# Patient Record
Sex: Female | Born: 1980
Health system: Southern US, Community
[De-identification: ages and names within clinical notes are randomized; demographics above are authoritative.]

## PROBLEM LIST (undated history)

## (undated) DIAGNOSIS — Z9981 Dependence on supplemental oxygen: Secondary | ICD-10-CM

## (undated) DIAGNOSIS — J449 Chronic obstructive pulmonary disease, unspecified: Secondary | ICD-10-CM

## (undated) DIAGNOSIS — E8881 Metabolic syndrome: Secondary | ICD-10-CM

## (undated) DIAGNOSIS — K259 Gastric ulcer, unspecified as acute or chronic, without hemorrhage or perforation: Secondary | ICD-10-CM

## (undated) DIAGNOSIS — M109 Gout, unspecified: Secondary | ICD-10-CM

## (undated) DIAGNOSIS — F329 Major depressive disorder, single episode, unspecified: Secondary | ICD-10-CM

## (undated) DIAGNOSIS — F3174 Bipolar disorder, in full remission, most recent episode manic: Secondary | ICD-10-CM

## (undated) DIAGNOSIS — I214 Non-ST elevation (NSTEMI) myocardial infarction: Secondary | ICD-10-CM

## (undated) DIAGNOSIS — F32A Depression, unspecified: Secondary | ICD-10-CM

## (undated) DIAGNOSIS — E7800 Pure hypercholesterolemia, unspecified: Secondary | ICD-10-CM

## (undated) DIAGNOSIS — E039 Hypothyroidism, unspecified: Secondary | ICD-10-CM

## (undated) DIAGNOSIS — I2510 Atherosclerotic heart disease of native coronary artery without angina pectoris: Secondary | ICD-10-CM

## (undated) DIAGNOSIS — I509 Heart failure, unspecified: Secondary | ICD-10-CM

## (undated) DIAGNOSIS — I10 Essential (primary) hypertension: Secondary | ICD-10-CM

## (undated) DIAGNOSIS — K219 Gastro-esophageal reflux disease without esophagitis: Secondary | ICD-10-CM

## (undated) DIAGNOSIS — E282 Polycystic ovarian syndrome: Secondary | ICD-10-CM

## (undated) DIAGNOSIS — E119 Type 2 diabetes mellitus without complications: Secondary | ICD-10-CM

## (undated) DIAGNOSIS — F419 Anxiety disorder, unspecified: Secondary | ICD-10-CM

## (undated) HISTORY — PX: CERVICAL BIOPSY  W/ LOOP ELECTRODE EXCISION: SUR135

## (undated) HISTORY — PX: CARPAL TUNNEL RELEASE: SHX101

## (undated) HISTORY — PX: TONSILLECTOMY: SUR1361

---

## 2000-11-18 ENCOUNTER — Encounter: Payer: Self-pay | Admitting: *Deleted

## 2000-11-18 ENCOUNTER — Emergency Department (HOSPITAL_COMMUNITY): Admission: EM | Admit: 2000-11-18 | Discharge: 2000-11-18 | Payer: Self-pay | Admitting: *Deleted

## 2001-02-16 ENCOUNTER — Emergency Department (HOSPITAL_COMMUNITY): Admission: EM | Admit: 2001-02-16 | Discharge: 2001-02-16 | Payer: Self-pay | Admitting: Emergency Medicine

## 2001-03-02 ENCOUNTER — Emergency Department (HOSPITAL_COMMUNITY): Admission: EM | Admit: 2001-03-02 | Discharge: 2001-03-02 | Payer: Self-pay | Admitting: Emergency Medicine

## 2001-12-24 ENCOUNTER — Emergency Department (HOSPITAL_COMMUNITY): Admission: EM | Admit: 2001-12-24 | Discharge: 2001-12-24 | Payer: Self-pay | Admitting: Emergency Medicine

## 2002-01-07 ENCOUNTER — Emergency Department (HOSPITAL_COMMUNITY): Admission: EM | Admit: 2002-01-07 | Discharge: 2002-01-07 | Payer: Self-pay | Admitting: *Deleted

## 2002-06-22 ENCOUNTER — Emergency Department (HOSPITAL_COMMUNITY): Admission: EM | Admit: 2002-06-22 | Discharge: 2002-06-22 | Payer: Self-pay | Admitting: Emergency Medicine

## 2002-07-28 ENCOUNTER — Emergency Department (HOSPITAL_COMMUNITY): Admission: EM | Admit: 2002-07-28 | Discharge: 2002-07-28 | Payer: Self-pay | Admitting: Emergency Medicine

## 2002-07-29 ENCOUNTER — Emergency Department (HOSPITAL_COMMUNITY): Admission: EM | Admit: 2002-07-29 | Discharge: 2002-07-29 | Payer: Self-pay

## 2002-09-22 ENCOUNTER — Emergency Department (HOSPITAL_COMMUNITY): Admission: EM | Admit: 2002-09-22 | Discharge: 2002-09-22 | Payer: Self-pay | Admitting: *Deleted

## 2002-09-22 ENCOUNTER — Encounter: Payer: Self-pay | Admitting: *Deleted

## 2003-01-04 ENCOUNTER — Emergency Department (HOSPITAL_COMMUNITY): Admission: EM | Admit: 2003-01-04 | Discharge: 2003-01-04 | Payer: Self-pay | Admitting: Emergency Medicine

## 2003-03-13 ENCOUNTER — Inpatient Hospital Stay (HOSPITAL_COMMUNITY): Admission: AD | Admit: 2003-03-13 | Discharge: 2003-03-13 | Payer: Self-pay | Admitting: Obstetrics & Gynecology

## 2003-03-25 ENCOUNTER — Encounter: Admission: RE | Admit: 2003-03-25 | Discharge: 2003-03-25 | Payer: Self-pay | Admitting: Obstetrics and Gynecology

## 2003-04-29 ENCOUNTER — Encounter: Admission: RE | Admit: 2003-04-29 | Discharge: 2003-04-29 | Payer: Self-pay | Admitting: Obstetrics and Gynecology

## 2003-06-10 ENCOUNTER — Emergency Department (HOSPITAL_COMMUNITY): Admission: EM | Admit: 2003-06-10 | Discharge: 2003-06-10 | Payer: Self-pay | Admitting: Emergency Medicine

## 2003-08-15 ENCOUNTER — Emergency Department (HOSPITAL_COMMUNITY): Admission: EM | Admit: 2003-08-15 | Discharge: 2003-08-15 | Payer: Self-pay | Admitting: Family Medicine

## 2003-09-04 ENCOUNTER — Emergency Department (HOSPITAL_COMMUNITY): Admission: AD | Admit: 2003-09-04 | Discharge: 2003-09-04 | Payer: Self-pay | Admitting: Family Medicine

## 2003-11-13 ENCOUNTER — Emergency Department (HOSPITAL_COMMUNITY): Admission: EM | Admit: 2003-11-13 | Discharge: 2003-11-13 | Payer: Self-pay | Admitting: Family Medicine

## 2003-12-23 ENCOUNTER — Inpatient Hospital Stay (HOSPITAL_COMMUNITY): Admission: AD | Admit: 2003-12-23 | Discharge: 2003-12-23 | Payer: Self-pay | Admitting: Family Medicine

## 2004-01-04 ENCOUNTER — Emergency Department (HOSPITAL_COMMUNITY): Admission: EM | Admit: 2004-01-04 | Discharge: 2004-01-05 | Payer: Self-pay | Admitting: Emergency Medicine

## 2004-03-26 ENCOUNTER — Emergency Department (HOSPITAL_COMMUNITY): Admission: EM | Admit: 2004-03-26 | Discharge: 2004-03-26 | Payer: Self-pay | Admitting: Unknown Physician Specialty

## 2004-04-17 ENCOUNTER — Emergency Department (HOSPITAL_COMMUNITY): Admission: EM | Admit: 2004-04-17 | Discharge: 2004-04-17 | Payer: Self-pay | Admitting: Emergency Medicine

## 2004-04-20 ENCOUNTER — Emergency Department (HOSPITAL_COMMUNITY): Admission: EM | Admit: 2004-04-20 | Discharge: 2004-04-20 | Payer: Self-pay | Admitting: Emergency Medicine

## 2004-05-04 ENCOUNTER — Emergency Department (HOSPITAL_COMMUNITY): Admission: EM | Admit: 2004-05-04 | Discharge: 2004-05-04 | Payer: Self-pay | Admitting: Family Medicine

## 2011-12-07 ENCOUNTER — Encounter (HOSPITAL_COMMUNITY): Payer: Self-pay | Admitting: *Deleted

## 2011-12-07 ENCOUNTER — Emergency Department (HOSPITAL_COMMUNITY)
Admission: EM | Admit: 2011-12-07 | Discharge: 2011-12-07 | Disposition: A | Discharge status: Home / Self Care | Payer: Medicare Other | Attending: Emergency Medicine | Admitting: Emergency Medicine

## 2011-12-07 DIAGNOSIS — E119 Type 2 diabetes mellitus without complications: Secondary | ICD-10-CM | POA: Insufficient documentation

## 2011-12-07 DIAGNOSIS — F419 Anxiety disorder, unspecified: Secondary | ICD-10-CM

## 2011-12-07 DIAGNOSIS — F411 Generalized anxiety disorder: Secondary | ICD-10-CM | POA: Insufficient documentation

## 2011-12-07 DIAGNOSIS — F319 Bipolar disorder, unspecified: Secondary | ICD-10-CM

## 2011-12-07 HISTORY — DX: Polycystic ovarian syndrome: E28.2

## 2011-12-07 HISTORY — DX: Anxiety disorder, unspecified: F41.9

## 2011-12-07 HISTORY — DX: Metabolic syndrome: E88.810

## 2011-12-07 HISTORY — DX: Metabolic syndrome: E88.81

## 2011-12-07 HISTORY — DX: Bipolar disorder, in full remission, most recent episode manic: F31.74

## 2011-12-07 LAB — COMPREHENSIVE METABOLIC PANEL
ALT: 18 U/L (ref 0–35)
Alkaline Phosphatase: 54 U/L (ref 39–117)
BUN: 13 mg/dL (ref 6–23)
CO2: 23 mEq/L (ref 19–32)
Calcium: 9.2 mg/dL (ref 8.4–10.5)
GFR calc Af Amer: >90 mL/min (ref >90)
GFR calc non Af Amer: >90 mL/min (ref >90)
Glucose, Bld: 85 mg/dL (ref 70–99)
Potassium: 3.6 mEq/L (ref 3.5–5.1)
Total Protein: 7.7 g/dL (ref 6.0–8.3)

## 2011-12-07 LAB — RAPID URINE DRUG SCREEN, HOSP PERFORMED
Barbiturates: NOT DETECTED
Cocaine: NOT DETECTED
Tetrahydrocannabinol: POSITIVE — AB

## 2011-12-07 LAB — ETHANOL: Alcohol, Ethyl (B): <11 mg/dL (ref 0–11)

## 2011-12-07 LAB — CBC
HCT: 45 % (ref 36.0–46.0)
Hemoglobin: 15.9 g/dL — ABNORMAL HIGH (ref 12.0–15.0)
MCH: 31.2 pg (ref 26.0–34.0)
MCHC: 35.3 g/dL (ref 30.0–36.0)
RBC: 5.1 MIL/uL (ref 3.87–5.11)

## 2011-12-07 NOTE — ED Notes (Signed)
ACT team at bedside speaking with patient.  

## 2011-12-07 NOTE — ED Notes (Signed)
Pt in bathroom changing into blue paper scrubs. 

## 2011-12-07 NOTE — ED Notes (Signed)
Pt here from home, accompanied by friend. Pt states she has hx of anxiety, bipolar disorder, and borderline personality disorder. Pt states she had a miscarriage last year, and was taken off psych meds since. Pt lost her mother recently and is experiencing more anxiety -states she has been self meditating with marijuana. Pt aware of disorders, wants to go back on medication. Denies SI, states she "has a lot to live for" and just "wants somebody to help". Pt denied treatment at [facility] - Was seen at [place], but group therapy was required. Pt wishes to seek individual treatment

## 2011-12-07 NOTE — Discharge Instructions (Signed)
Read the information below.  If you feel out of control and are unable to calm yourself down, or ever feel like you are a danger to yourself or others, please call 911 or return to the ER immediately.  You may return to the ER at any time for worsening condition or any new symptoms that concern you.   Anxiety and Panic Attacks Your caregiver has informed you that you are having an anxiety or panic attack. There may be many forms of this. Most of the time these attacks come suddenly and without warning. They come at any time of day, including periods of sleep, and at any time of life. They may be strong and unexplained. Although panic attacks are very scary, they are physically harmless. Sometimes the cause of your anxiety is not known. Anxiety is a protective mechanism of the body in its fight or flight mechanism. Most of these perceived danger situations are actually nonphysical situations (such as anxiety over losing a job). CAUSES  The causes of an anxiety or panic attack are many. Panic attacks may occur in otherwise healthy people given a certain set of circumstances. There may be a genetic cause for panic attacks. Some medications may also have anxiety as a side effect. SYMPTOMS  Some of the most common feelings are:  Intense terror.   Dizziness, feeling faint.   Hot and cold flashes.   Fear of going crazy.   Feelings that nothing is real.   Sweating.   Shaking.   Chest pain or a fast heartbeat (palpitations).   Smothering, choking sensations.   Feelings of impending doom and that death is near.   Tingling of extremities, this may be from over-breathing.   Altered reality (derealization).   Being detached from yourself (depersonalization).  Several symptoms can be present to make up anxiety or panic attacks. DIAGNOSIS  The evaluation by your caregiver will depend on the type of symptoms you are experiencing. The diagnosis of anxiety or panic attack is made when no physical  illness can be determined to be a cause of the symptoms. TREATMENT  Treatment to prevent anxiety and panic attacks may include:  Avoidance of circumstances that cause anxiety.   Reassurance and relaxation.   Regular exercise.   Relaxation therapies, such as yoga.   Psychotherapy with a psychiatrist or therapist.   Avoidance of caffeine, alcohol and illegal drugs.   Prescribed medication.  SEEK IMMEDIATE MEDICAL CARE IF:   You experience panic attack symptoms that are different than your usual symptoms.   You have any worsening or concerning symptoms.  Document Released: 06/20/2005 Document Revised: 06/09/2011 Document Reviewed: 10/22/2009 [facility] Patient Information 2012 [place], [place].  Manic Depression (Bipolar Disorder) Bipolar disorder is also known as manic depressive illness. It is when the brain does not function properly and causes shifts in a person's moods, energy and ability to function in everyday life. These shifts are different from the normal ups and downs that everyone experiences. Instead the shifts are severe. If this goes untreated, the person's life becomes more and more disorderly. People with this disorder can be treated can lead full and productive lives. This disorder must be managed throughout life.  SYMPTOMS   Bipolar disorder causes dramatic mood swings. These mood swings go in cycles. They cycle from extreme "highs" and irritable to deep "lows" of sadness and hopelessness.   Between the extreme moods, there are usually periods of normal mood.   Along with the mood shifts, the person will have  severe changes in energy and behavior. The periods of "highs" and "lows" are called episodes of mania and depression.  Signs of mania:  Lots of energy, activity and restlessness.   Extreme "high" or good mood.   Extreme irritability.   Racing thoughts and talking very fast.   Jumping from one idea to another.   Not able to focus, easily distracted.    Little need to sleep.   Grand beliefs in one's abilities and powers.   Spending sprees.   Increased sexual drive. This can result in many sexual partners.   Poor judgment.   Abuse of drugs, particularly cocaine, alcohol, and sleeping medication.   Aggressive or provocative behavior.   A lasting period of behavior that is different from usual.   Denial that anything is wrong.  *A manic episode is identified if a "high" mood happens with three or more of the other symptoms lasting most of the day, nearly everyday for a week or longer. If the mood is more irritable in nature, four additional symptoms must be present. Signs of depression:  Lasting feelings of sadness, anxiety, or empty mood.   Feelings of hopelessness with negative thoughts.   Feelings of guilt, worthlessness, or helplessness.   Loss of interest or pleasure in activities once enjoyed, including sex.   Feelings of fatigue or having less energy.   Trouble focusing, making decisions, remembering.   Feeling restless or irritable.   Sleeping too little or too much.   Change in eating with possible weight gain or loss.   Feeling ongoing pain that is not caused by physical illness or injury.   Thoughts of death or suicide or suicide attempts.  *A depressive episode is identified as having five or more of the above symptoms that last most of the day, nearly everyday for two weeks or longer. CAUSES   Research shows that there is no single cause for the disorder. Many factors act together to produce the illness.   This can be passed down from family (hereditary).   Environment may play a part.  TREATMENT   Long-term treatment is strongly recommended because bipolar disorder is a repeated illness. This disorder is better controlled if treatment is ongoing than if it is off and on.   A combination of medication and talk therapy is best for managing the disorder over time.   Medication.   Medication can be  prescribed by a doctor that is an expert in treating mental disorders (psychiatrists). Medications known as "mood stabilizers" are usually prescribed to help control the illness. Other medications can be added when needed. These medicines usually treat episodes of mania or depression that break through despite the mood stabilizer.   Talk Therapy.   Along with medication, some forms of talk therapy are helpful in providing support, education and guidance to people with the illness and their families. Studies show that this type of treatment increases mood stability, decreases need for hospitalization and improves how they function society.   Electroconvulsive Therapy (ECT).   In extreme situations where the above treatments do not work or work too slowly to relieve severe symptoms, ECT may be considered.  Document Released: 09/26/2000 Document Revised: 06/09/2011 Document Reviewed: 05/18/2007 [facility] Patient Information 2012 [place], [place].Manic Depression (Bipolar Disorder) Bipolar disorder is also known as manic depressive illness. It is when the brain does not function properly and causes shifts in a person's moods, energy and ability to function in everyday life. These shifts are different from the normal  ups and downs that everyone experiences. Instead the shifts are severe. If this goes untreated, the person's life becomes more and more disorderly. People with this disorder can be treated can lead full and productive lives. This disorder must be managed throughout life.  SYMPTOMS   Bipolar disorder causes dramatic mood swings. These mood swings go in cycles. They cycle from extreme "highs" and irritable to deep "lows" of sadness and hopelessness.   Between the extreme moods, there are usually periods of normal mood.   Along with the mood shifts, the person will have severe changes in energy and behavior. The periods of "highs" and "lows" are called episodes of mania and depression.  Signs  of mania:  Lots of energy, activity and restlessness.   Extreme "high" or good mood.   Extreme irritability.   Racing thoughts and talking very fast.   Jumping from one idea to another.   Not able to focus, easily distracted.   Little need to sleep.   Grand beliefs in one's abilities and powers.   Spending sprees.   Increased sexual drive. This can result in many sexual partners.   Poor judgment.   Abuse of drugs, particularly cocaine, alcohol, and sleeping medication.   Aggressive or provocative behavior.   A lasting period of behavior that is different from usual.   Denial that anything is wrong.  *A manic episode is identified if a "high" mood happens with three or more of the other symptoms lasting most of the day, nearly everyday for a week or longer. If the mood is more irritable in nature, four additional symptoms must be present. Signs of depression:  Lasting feelings of sadness, anxiety, or empty mood.   Feelings of hopelessness with negative thoughts.   Feelings of guilt, worthlessness, or helplessness.   Loss of interest or pleasure in activities once enjoyed, including sex.   Feelings of fatigue or having less energy.   Trouble focusing, making decisions, remembering.   Feeling restless or irritable.   Sleeping too little or too much.   Change in eating with possible weight gain or loss.   Feeling ongoing pain that is not caused by physical illness or injury.   Thoughts of death or suicide or suicide attempts.  *A depressive episode is identified as having five or more of the above symptoms that last most of the day, nearly everyday for two weeks or longer. CAUSES   Research shows that there is no single cause for the disorder. Many factors act together to produce the illness.   This can be passed down from family (hereditary).   Environment may play a part.  TREATMENT   Long-term treatment is strongly recommended because bipolar disorder  is a repeated illness. This disorder is better controlled if treatment is ongoing than if it is off and on.   A combination of medication and talk therapy is best for managing the disorder over time.   Medication.   Medication can be prescribed by a doctor that is an expert in treating mental disorders (psychiatrists). Medications known as "mood stabilizers" are usually prescribed to help control the illness. Other medications can be added when needed. These medicines usually treat episodes of mania or depression that break through despite the mood stabilizer.   Talk Therapy.   Along with medication, some forms of talk therapy are helpful in providing support, education and guidance to people with the illness and their families. Studies show that this type of treatment increases mood stability, decreases  need for hospitalization and improves how they function society.   Electroconvulsive Therapy (ECT).   In extreme situations where the above treatments do not work or work too slowly to relieve severe symptoms, ECT may be considered.  Document Released: 09/26/2000 Document Revised: 06/09/2011 Document Reviewed: 05/18/2007 [facility] Patient Information 2012 [place], [place].

## 2011-12-07 NOTE — ED Notes (Signed)
Pt. In gown bc too obese for paper scrubs. -- Pt. wanded by security. Pt.'s belongings taken home by family.

## 2011-12-07 NOTE — ED Notes (Signed)
Bed:WTR5<BR> Expected date:<BR> Expected time:<BR> Means of arrival:<BR> Comments:<BR>

## 2011-12-07 NOTE — ED Provider Notes (Signed)
History     CSN: [id number]  Arrival date & time 12/07/11  1709   First MD Initiated Contact with Patient 12/07/11 1920      Chief Complaint  Patient presents with  . Medical Clearance  . Anxiety    (Consider location/radiation/quality/duration/timing/severity/associated sxs/prior treatment) HPI Comments: Patient reports she has a long history of bipolar disorder and anxiety disorder and has been off of her medications for three weeks.  States she has a lot of stress in her life - states her mother died earlier this year and the mother's birthday is coming up soon.  States she wanted to call her today and realized she couldn't and that caused her to have a panic attack.  States she feels fine now, does not feel manic, depressed, or anxious.  States she has no intention or thoughts of suicide, harming herself, or harming anyone else.  She was recently married and has a son and states she cannot wait to see them, that she would never think of harming herself because she is so happy in this situation and has so much hope and so many plans for her life.  Pt has an appointment at [facility] with Dr Excell Seltzer on Monday - this is who previously prescribed her medications.  States what she really wants is to have individual therapy with someone.  The programs that are currently available to her all require group therapy.  Pt is here simply for resources.  She is not interested in inpatient therapy or treatment.    Patient is a 31 y.o. female presenting with anxiety. The history is provided by the patient.  Anxiety Pertinent negatives include no chest pain.    Past Medical History  Diagnosis Date  . Bipolar 1 disorder, manic, full remission   . Anxiety   . PCOS (polycystic ovarian syndrome)   . Diabetes mellitus     boarderline diabetic  . Metabolic syndrome     Past Surgical History  Procedure Date  . Carpectomy     No family history on file.  History  Substance Use Topics  . Smoking  status: Current Everyday Smoker -- 1.0 packs/day for 20 years    Types: Cigarettes  . Smokeless tobacco: Not on file  . Alcohol Use: No    OB History    Grav Para Term Preterm Abortions TAB SAB Ect Mult Living                  Review of Systems  Constitutional: Negative for activity change.  Cardiovascular: Negative for chest pain.  Gastrointestinal: Negative for abdominal distention.  Psychiatric/Behavioral: Negative for suicidal ideas, confusion, self-injury and agitation. The patient is not hyperactive.     Allergies  Levaquin  Home Medications   Current Outpatient Rx  Name Route Sig Dispense Refill  . FUROSEMIDE 40 MG PO TABS Oral Take 40 mg by mouth daily.      BP 122/67  Pulse 85  Temp(Src) 98.7 F (37.1 C) (Oral)  Resp 16  Ht 5' 7.5" (1.715 m)  Wt 312 lb (141.522 kg)  BMI 48.14 kg/m2  SpO2 95%  Physical Exam  Nursing note and vitals reviewed. Constitutional: She is oriented to person, place, and time. She appears well-developed and well-nourished. No distress.  HENT:  Head: Normocephalic and atraumatic.  Neck: Neck supple.  Cardiovascular: Normal rate, regular rhythm and normal heart sounds.   Pulmonary/Chest: Breath sounds normal. No respiratory distress. She has no wheezes. She has no rales. She exhibits no  tenderness.  Abdominal: Soft. Bowel sounds are normal. There is no tenderness.  Neurological: She is alert and oriented to person, place, and time.  Skin: She is not diaphoretic.  Psychiatric: Her speech is normal and behavior is normal. Judgment normal. Cognition and memory are normal. She expresses no homicidal and no suicidal ideation. She expresses no suicidal plans and no homicidal plans.       Cries and smiles when speaking about her son and new husband.      ED Course  Procedures (including critical care time)  Labs Reviewed  CBC - Abnormal; Notable for the following:    WBC 10.6 (*)    Hemoglobin 15.9 (*)    All other components within  normal limits  URINE RAPID DRUG SCREEN (HOSP PERFORMED) - Abnormal; Notable for the following:    Tetrahydrocannabinol POSITIVE (*)    All other components within normal limits  COMPREHENSIVE METABOLIC PANEL  ETHANOL  ACETAMINOPHEN LEVEL  POCT PREGNANCY, URINE   No results found.  8:04 PM ACT team Judeth Cornfield to see patient to give resources.    1. Anxiety   2. Bipolar disorder       MDM  Bipolar pt with anxiety disorder presented for resources for individual therapy.  Pt is very optimistic for the future and denies SI.  Pt given resources for individual therapy.  Pt also has follow up scheduled early next week to restart her medications.  Pt advised to return for any worsening symptoms.  Patient verbalizes understanding and agrees with plan.         Dillard Cannon [place], [place] 12/07/11 2210

## 2011-12-08 NOTE — ED Provider Notes (Signed)
Medical screening examination/treatment/procedure(s) were performed by non-physician practitioner and as supervising physician I was immediately available for consultation/collaboration.  Julieth Tugman L Brinlee Gambrell, MD 12/08/11 0035 

## 2012-03-29 DIAGNOSIS — F32A Depression, unspecified: Secondary | ICD-10-CM | POA: Insufficient documentation

## 2012-04-27 DIAGNOSIS — E781 Pure hyperglyceridemia: Secondary | ICD-10-CM | POA: Insufficient documentation

## 2013-01-31 DIAGNOSIS — F4310 Post-traumatic stress disorder, unspecified: Secondary | ICD-10-CM | POA: Insufficient documentation

## 2013-01-31 DIAGNOSIS — F419 Anxiety disorder, unspecified: Secondary | ICD-10-CM | POA: Insufficient documentation

## 2013-01-31 DIAGNOSIS — F5104 Psychophysiologic insomnia: Secondary | ICD-10-CM | POA: Insufficient documentation

## 2013-06-19 ENCOUNTER — Encounter (HOSPITAL_COMMUNITY): Payer: Self-pay | Admitting: Emergency Medicine

## 2013-06-19 ENCOUNTER — Emergency Department (HOSPITAL_COMMUNITY)
Admission: EM | Admit: 2013-06-19 | Discharge: 2013-06-19 | Disposition: A | Discharge status: Home / Self Care | Payer: Medicare Other | Attending: Emergency Medicine | Admitting: Emergency Medicine

## 2013-06-19 DIAGNOSIS — Z8659 Personal history of other mental and behavioral disorders: Secondary | ICD-10-CM | POA: Insufficient documentation

## 2013-06-19 DIAGNOSIS — R519 Headache, unspecified: Secondary | ICD-10-CM

## 2013-06-19 DIAGNOSIS — R51 Headache: Secondary | ICD-10-CM | POA: Insufficient documentation

## 2013-06-19 DIAGNOSIS — R1110 Vomiting, unspecified: Secondary | ICD-10-CM | POA: Insufficient documentation

## 2013-06-19 DIAGNOSIS — E119 Type 2 diabetes mellitus without complications: Secondary | ICD-10-CM | POA: Insufficient documentation

## 2013-06-19 DIAGNOSIS — F17200 Nicotine dependence, unspecified, uncomplicated: Secondary | ICD-10-CM | POA: Insufficient documentation

## 2013-06-19 DIAGNOSIS — H53149 Visual discomfort, unspecified: Secondary | ICD-10-CM | POA: Insufficient documentation

## 2013-06-19 MED ORDER — SODIUM CHLORIDE 0.9 % IV BOLUS (SEPSIS)
1000.0000 mL | Freq: Once | INTRAVENOUS | Status: AC
  Administered 2013-06-19: 1000 mL via INTRAVENOUS

## 2013-06-19 MED ORDER — DIPHENHYDRAMINE HCL 50 MG/ML IJ SOLN
25.0000 mg | Freq: Once | INTRAMUSCULAR | Status: AC
  Administered 2013-06-19: 25 mg via INTRAVENOUS
  Filled 2013-06-19: qty 1

## 2013-06-19 MED ORDER — METOCLOPRAMIDE HCL 5 MG/ML IJ SOLN
10.0000 mg | Freq: Once | INTRAMUSCULAR | Status: AC
  Administered 2013-06-19: 10 mg via INTRAVENOUS
  Filled 2013-06-19: qty 2

## 2013-06-19 NOTE — ED Provider Notes (Signed)
CSN: [id number]     Arrival date & time 06/19/13  1005 History   First MD Initiated Contact with Patient 06/19/13 1107     Chief Complaint  Patient presents with  . Headache   (Consider location/radiation/quality/duration/timing/severity/associated sxs/prior Treatment) Patient is a 32 y.o. female presenting with headaches.  Headache Pain location:  Occipital and frontal Quality:  Dull Radiates to:  Does not radiate Pain severity now: moderate. Pain scale at highest: severe. Onset quality:  Sudden Duration: several hours. Timing:  Constant Progression:  Improving Chronicity:  Recurrent Similar to prior headaches: yes   Context comment:  Began after she awoke early this morning Relieved by:  Nothing Worsened by:  Light and sound Ineffective treatments:  NSAIDs and acetaminophen Associated symptoms: photophobia and vomiting   Associated symptoms: no abdominal pain, no blurred vision, no congestion, no cough, no diarrhea, no pain, no fever, no nausea and no numbness     Past Medical History  Diagnosis Date  . Bipolar 1 disorder, manic, full remission   . Anxiety   . PCOS (polycystic ovarian syndrome)   . Diabetes mellitus     boarderline diabetic  . Metabolic syndrome    Past Surgical History  Procedure Laterality Date  . Carpectomy     History reviewed. No pertinent family history. History  Substance Use Topics  . Smoking status: Current Every Day Smoker -- 1.00 packs/day for 20 years    Types: Cigarettes  . Smokeless tobacco: Not on file  . Alcohol Use: No   OB History   Grav Para Term Preterm Abortions TAB SAB Ect Mult Living                 Review of Systems  Constitutional: Negative for fever.  HENT: Negative for congestion.   Eyes: Positive for photophobia. Negative for blurred vision and pain.  Respiratory: Negative for cough and shortness of breath.   Cardiovascular: Negative for chest pain.  Gastrointestinal: Positive for vomiting. Negative for  nausea, abdominal pain and diarrhea.  Neurological: Positive for headaches. Negative for numbness.  All other systems reviewed and are negative.    Allergies  Levaquin  Home Medications   Current Outpatient Rx  Name  Route  Sig  Dispense  Refill  . acetaminophen (TYLENOL) 500 MG tablet   Oral   Take 1,000 mg by mouth every 6 (six) hours as needed for mild pain, moderate pain or headache.         . furosemide (LASIX) 40 MG tablet   Oral   Take 40 mg by mouth daily.         Marland Kitchen ibuprofen (ADVIL,MOTRIN) 200 MG tablet   Oral   Take 600 mg by mouth every 6 (six) hours as needed for headache, mild pain or moderate pain.         . naproxen sodium (ANAPROX) 220 MG tablet   Oral   Take 440 mg by mouth 2 (two) times daily as needed (for pain/headache).          BP 156/92  Pulse 87  Temp(Src) 99.2 F (37.3 C) (Oral)  Resp 18  SpO2 98% Physical Exam  Nursing note and vitals reviewed. Constitutional: She is oriented to person, place, and time. She appears well-developed and well-nourished. No distress.  HENT:  Head: Normocephalic and atraumatic.  Mouth/Throat: Oropharynx is clear and moist.  Eyes: Conjunctivae are normal. Pupils are equal, round, and reactive to light. No scleral icterus.  Neck: Neck supple.  Cardiovascular: Normal rate,  regular rhythm, normal heart sounds and intact distal pulses.   No murmur heard. Pulmonary/Chest: Effort normal and breath sounds normal. No stridor. No respiratory distress. She has no rales.  Abdominal: Soft. Bowel sounds are normal. She exhibits no distension. There is no tenderness.  Musculoskeletal: Normal range of motion.  Neurological: She is alert and oriented to person, place, and time. She has normal strength. No cranial nerve deficit or sensory deficit. Coordination and gait normal. GCS eye subscore is 4. GCS verbal subscore is 5. GCS motor subscore is 6.  Skin: Skin is warm and dry. No rash noted.  Psychiatric: She has a normal  mood and affect. Her behavior is normal.    ED Course  Procedures (including critical care time) Labs Review Labs Reviewed - No data to display Imaging Review No results found.  EKG Interpretation   None       MDM   1. Headache    32 yo female with hx of headaches presenting with a headache which she describes as typical for her.  She reports normally have abrupt onset of her headache and associated vomiting when they are bad.  She also reports an aura (seeing bright lights), which is also typical for her.  Given the recurrent nature and well appearance of pt, I don't think emergent neuro imaging is warranted.  Will treat headache with IV reglan, IV benadryl, and IV fluids.   1:02 PM Pt elected to leave the emergency department with her companion shortly after receiving her medications.  She reported not feeling much better yet, but does not want to stay for further treatment or assessment.    Candyce Churn, MD 06/19/13 [phone]

## 2013-06-19 NOTE — ED Notes (Addendum)
Pt c/o HA in back of head intermittent x 3 weeks worse last night with N/V; pt sts out of regular meds x 3 months

## 2013-10-13 ENCOUNTER — Emergency Department (HOSPITAL_COMMUNITY)
Admission: EM | Admit: 2013-10-13 | Discharge: 2013-10-13 | Disposition: A | Discharge status: Home / Self Care | Payer: Medicare HMO | Attending: Emergency Medicine | Admitting: Emergency Medicine

## 2013-10-13 ENCOUNTER — Encounter (HOSPITAL_COMMUNITY): Payer: Self-pay | Admitting: Emergency Medicine

## 2013-10-13 DIAGNOSIS — S025XXA Fracture of tooth (traumatic), initial encounter for closed fracture: Secondary | ICD-10-CM | POA: Insufficient documentation

## 2013-10-13 DIAGNOSIS — Z79899 Other long term (current) drug therapy: Secondary | ICD-10-CM | POA: Insufficient documentation

## 2013-10-13 DIAGNOSIS — Y939 Activity, unspecified: Secondary | ICD-10-CM | POA: Insufficient documentation

## 2013-10-13 DIAGNOSIS — Z8659 Personal history of other mental and behavioral disorders: Secondary | ICD-10-CM | POA: Insufficient documentation

## 2013-10-13 DIAGNOSIS — Y929 Unspecified place or not applicable: Secondary | ICD-10-CM | POA: Insufficient documentation

## 2013-10-13 DIAGNOSIS — F17200 Nicotine dependence, unspecified, uncomplicated: Secondary | ICD-10-CM | POA: Insufficient documentation

## 2013-10-13 DIAGNOSIS — X58XXXA Exposure to other specified factors, initial encounter: Secondary | ICD-10-CM | POA: Insufficient documentation

## 2013-10-13 DIAGNOSIS — K029 Dental caries, unspecified: Secondary | ICD-10-CM | POA: Insufficient documentation

## 2013-10-13 MED ORDER — PENICILLIN V POTASSIUM 500 MG PO TABS: 500.0000 mg | ORAL_TABLET | Freq: Four times a day (QID) | ORAL | Status: DC

## 2013-10-13 MED ORDER — IBUPROFEN 800 MG PO TABS: 800.0000 mg | ORAL_TABLET | Freq: Three times a day (TID) | ORAL | Status: DC

## 2013-10-13 MED ORDER — HYDROCODONE-ACETAMINOPHEN 5-325 MG PO TABS: 1.0000 | ORAL_TABLET | ORAL | Status: DC | PRN

## 2013-10-13 NOTE — ED Provider Notes (Signed)
CSN: [id number]     Arrival date & time 10/13/13  1059 History   First MD Initiated Contact with Patient 10/13/13 1129     Chief Complaint  Patient presents with  . Dental Pain    pain in l/lower back jaw  . Headache     (Consider location/radiation/quality/duration/timing/severity/associated sxs/prior Treatment) Patient is a 33 y.o. female presenting with tooth pain and headaches. The history is provided by the patient. No language interpreter was used.  Dental Pain Location:  Lower Lower teeth location:  21/LL 1st bicuspid Quality:  Dull, aching and throbbing Severity:  Moderate Onset quality:  Gradual Duration:  4 days Timing:  Constant Progression:  Worsening Chronicity:  Chronic Context: crown fracture, dental fracture and poor dentition   Context: not abscess   Relieved by:  Nothing Worsened by:  Jaw movement Ineffective treatments:  NSAIDs Associated symptoms: facial pain and headaches (Facial pain)   Associated symptoms: no drooling, no facial swelling, no fever, no gum swelling, no neck swelling and no trismus   Headache Associated symptoms: facial pain   Associated symptoms: no fever     Past Medical History  Diagnosis Date  . Bipolar 1 disorder, manic, full remission   . Anxiety   . PCOS (polycystic ovarian syndrome)   . Diabetes mellitus     boarderline diabetic  . Metabolic syndrome    Past Surgical History  Procedure Laterality Date  . Carpectomy     Family History  Problem Relation Age of Onset  . Diabetes Mother   . Heart failure Mother    History  Substance Use Topics  . Smoking status: Current Every Day Smoker -- 1.00 packs/day for 20 years    Types: Cigarettes  . Smokeless tobacco: Not on file  . Alcohol Use: No   OB History   Grav Para Term Preterm Abortions TAB SAB Ect Mult Living                 Review of Systems  Constitutional: Negative for fever and chills.  HENT: Positive for dental problem. Negative for drooling and facial  swelling.   Neurological: Positive for headaches (Facial pain).      Allergies  Levaquin and Metoprolol  Home Medications   Current Outpatient Rx  Name  Route  Sig  Dispense  Refill  . acetaminophen (TYLENOL) 500 MG tablet   Oral   Take 1,000 mg by mouth every 6 (six) hours as needed for mild pain, moderate pain or headache.         . furosemide (LASIX) 40 MG tablet   Oral   Take 40 mg by mouth daily.         Marland Kitchen ibuprofen (ADVIL,MOTRIN) 200 MG tablet   Oral   Take 600 mg by mouth every 6 (six) hours as needed for headache, mild pain or moderate pain.         . naproxen sodium (ANAPROX) 220 MG tablet   Oral   Take 440 mg by mouth 2 (two) times daily as needed (for pain/headache).          BP 127/86  Pulse 74  Temp(Src) 98.2 F (36.8 C) (Oral)  Resp 20  Wt 298 lb (135.172 kg)  SpO2 100% Physical Exam  Nursing note and vitals reviewed. Constitutional: She appears well-developed and well-nourished. No distress.  HENT:  Head: Normocephalic and atraumatic.  Mouth/Throat: Oropharynx is clear and moist and mucous membranes are normal. No oral lesions. No trismus in the jaw. Abnormal  dentition. Dental caries present. No dental abscesses.    Tooth #21 with a linear fracture to the lingual surface. No erythema or obvious abscess, no swelling to face. Floor of mouth soft, non-tender.  Eyes: Conjunctivae are normal.  Neck: Normal range of motion. Neck supple.  Pulmonary/Chest: Effort normal. No respiratory distress.  Neurological: She is alert.  Skin: Skin is warm and dry. No rash noted. She is not diaphoretic.  Psychiatric: She has a normal mood and affect. Her behavior is normal.    ED Course  Procedures (including critical care time) Labs Review Labs Reviewed - No data to display Imaging Review No results found.   EKG Interpretation None      MDM   Final diagnoses:  Tooth fracture   Patient with toothache, afebrile.  No gross abscess.  Exam  unconcerning for Ludwig's angina or spread of infection.  Will treat with penicillin and pain medicine.  Urged patient to follow-up with dentist.    Meds given in ED:  Medications - No data to display  New Prescriptions   HYDROCODONE-ACETAMINOPHEN (NORCO/VICODIN) 5-325 MG PER TABLET    Take 1 tablet by mouth every 4 (four) hours as needed.   IBUPROFEN (ADVIL,MOTRIN) 800 MG TABLET    Take 1 tablet (800 mg total) by mouth 3 (three) times daily. Take with food   PENICILLIN V POTASSIUM (VEETID) 500 MG TABLET    Take 1 tablet (500 mg total) by mouth 4 (four) times daily.        Lorrine Kin, PA-C 10/15/13 [phone]

## 2013-10-13 NOTE — ED Notes (Signed)
Pt reports 4 day hx of l/dental pain in lower back jaw. Pt stated that the gum is "purple" and very tender. Pt has tried multiple OTC meds for pain management

## 2013-10-13 NOTE — Discharge Instructions (Signed)
Call for a follow up appointment with a Family or Primary Care Provider.  Call a dentist for further treatment of your fractured tooth. Return if Symptoms worsen.   Take medication as prescribed.    Emergency Department Resource Guide 1) Find a Doctor and Pay Out of Pocket Although you won't have to find out who is covered by your insurance plan, it is a good idea to ask around and get recommendations. You will then need to call the office and see if the doctor you have chosen will accept you as a new patient and what types of options they offer for patients who are self-pay. Some doctors offer discounts or will set up payment plans for their patients who do not have insurance, but you will need to ask so you aren't surprised when you get to your appointment.  2) Contact Your Local Health Department Not all health departments have doctors that can see patients for sick visits, but many do, so it is worth a call to see if yours does. If you don't know where your local health department is, you can check in your phone book. The CDC also has a tool to help you locate your state's health department, and many state websites also have listings of all of their local health departments.  3) Find a [facility] If your illness is not likely to be very severe or complicated, you may want to try a walk in clinic. These are popping up all over the country in pharmacies, drugstores, and shopping centers. They're usually staffed by nurse practitioners or physician assistants that have been trained to treat common illnesses and complaints. They're usually fairly quick and inexpensive. However, if you have serious medical issues or chronic medical problems, these are probably not your best option.  No Primary Care Doctor: - Call Health Connect at  [phone] - they can help you locate a primary care doctor that  accepts your insurance, provides certain services, etc. - Physician Referral Service-  [phone]  Chronic Pain Problems: Organization         Address  Phone   Notes  [facility]  [phone] Patients need to be referred by their primary care doctor.   Medication Assistance: Organization         Address  Phone   Notes  [facility] Medication [facility] [address] [place], [place] [address] [phone] --Must be a resident of [facility] -- Must have NO insurance coverage whatsoever (no Medicaid/ Medicare, etc.) -- The pt. MUST have a primary care doctor that directs their care regularly and follows them in the community   Med[facility]ist  [phone]   [employer]  [phone]    Agencies that provide inexpensive medical care: Organization         Address  Phone   Notes  [place]  [phone]   [facility]    [phone]   [facility] [place], [place] [address] [phone]   [place] [address] [place] [phone]   Planned Parenthood    [phone]   [facility]    [phone]   [place] and [place] [address], [place] Phone:  [phone], Fax:  [phone] Hours of Operation:  9 am - 6 pm, M-F.  Also accepts Medicaid/Medicare and self-pay.  [facility]  [address], [place] Phone: [phone], Fax: [phone]. Hours of Operation:  8:30 am - 5:30 pm, M-F.  Also accepts Medicaid and self-pay.  [facility] [place] [address], [place] Phone: [phone]   [place], [place], [place] [phone], Ext. [phone] Mondays & Thursdays: 7-9 AM.  First 15 patients are seen on a first come, first serve basis.    [place] Providers:  Organization         Address  Phone   Notes  [facility] [address],  [place] [phone] Also accepts self-pay patients.  [facility] [address], [place]  [phone]   [place], [address], [place] [phone]   [facility] [address] [place] [phone]   Lucianne Lei [address], [place]   [phone] Only accepts [place] Access [place] patients after they have their name applied to their card.   Self-Pay (no insurance) in [facility]:  Organization         Address  Phone   Notes  Sickle Cell Patients, [facility] Internal Medicine [place] [phone]   [facility] [place] [phone]   [facility] [place]  [place], [place], [place] [phone]   [facility]/Dr. Osei-Bonsu  [address], [place] or [address], [place] [phone] Phone number for both [place] and [place] locations is the same.  [facility]gent Medical and [facility] [address], [place] [phone]   [facility] [address] [place] or [address] [phone]   [facility] [address], [place] [phone], phone; [phone], fax Sees patients 1st and 3rd Saturday of every month.  Must not qualify for public or private insurance (i.e. Medicaid, Medicare, [place] Health Choice, Veterans' Benefits)  Household income should be no more than 200% of the poverty level The clinic cannot treat you if you are pregnant or think you are pregnant  Sexually transmitted diseases are not treated at the clinic.    Dental Care: Organization         Address  Phone  Notes  [facility] Department of [facility] [place] [phone] Accepts children up to age 29 who are enrolled in  [place] or [place]; pregnant women with a Medicaid card; and children who have applied for Medicaid or [place] Health Choice, but were declined, whose parents can pay a reduced fee at time of service.  [facility] Department of [facility]  [address], [place] [phone] Accepts children up to age 36 who are enrolled in [place] or [place]; pregnant women with a Medicaid card; and children who have applied for Medicaid or [place] Health Choice, but were declined, whose parents can pay a reduced fee at time of service.  [place] Adult [facility] Access PROGRAM  [place] [phone] Patients are seen by appointment only. Walk-ins are not accepted. [place] will see patients 66 years of age and older. Monday - Tuesday (8am-5pm) Most Wednesdays (8:30-5pm) $30 per visit, cash only  [place] Adult Dental Access PRO[facility]  [address], [facility] [phone] Patients are seen by appointment only. Walk-ins are not accepted. [place] will see patients 55 years of age and older. One Wednesday Evening (Monthly: Volunteer Based).  $30 per visit, cash only  [place]  [phone] for adults; Children under age 16, call Graduate Pediatric Dentistry at [phone]. Children aged 15-14, please call [phone] to request a pediatric application.  Dental services are provided in all areas of dental care including fillings, crowns and bridges, complete and partial dentures, implants, gum treatment, root canals, and extractions. Preventive care is also provided. Treatment is provided to both adults and children. Patients are selected via a lottery and there is often a waiting list.   [facility] [address] [place]  [phone] [email or web address]   [facility] [address] [place], [place] [phone], Ext. [phone] Second and Fourth Thursday of each month, opens at 6:30  AM; Clinic ends at 9 AM.  Patients are seen on a first-come first-served basis, and a limited number are seen during each clinic.   [facility]  [address] [place]ard Danker [place], [place] [phone]   Eligibility Requirements You must have lived in [place], [place], or [place] counties for at least the last three months.   You cannot be eligible for state or federal sponsored [employer], including [employer], [place], or [employer].   You generally cannot be eligible for healthcare insurance through your employer.    How to apply: Eligibility screenings are held every Tuesday and Wednesday afternoon from 1:00 pm until 4:00 pm. You do not need an appointment for the interview!  [facility] [address] [place], [place]   [employer]  [place] Department  [place]  [phone]    Behavioral Health Resources in the Community: Intensive Outpatient Programs Organization         Address  Phone  Notes  [place]. [address], [place], [place] [phone]   [facility] Outpatient [address], [place], [employer]   ADS: Alcohol & Drug Svcs [address], [place], [place]   [place] [address]  [place], [place] or [phone]   Substance Abuse Resources Organization         Address  Phone  Notes  Alcohol and Drug Services  [phone]   [place]  [phone]   [facility] [place]   Chinita Pester  [phone]   Residential & Outpatient Substance Abuse Program  [phone]   Psychological Services Organization         Address  Phone  Notes  [facility] [place]  [place]  [phone]   [place] [address] [place] [phone] or  [phone]    Mobile Crisis Teams Organization         Address  Phone  Notes  [facility], Mobile Crisis Care Unit  [phone]   [facility]  [address] [place], [place]   [place] [address] [place] [phone]    Self-Help/Support Groups Organization         Address  Phone             Notes  Mental Health Assoc. of [place] - variety of support groups  [place] Call for more information  Narcotics Anonymous (NA), Caring [facility] [address], [employer] [place]  2 meetings at this location   Special educational needs teacher         Address  Phone  Notes  [facility] [place],    [place]  [phone]   [facility]  [address], [place] [address], [place], [place]   [place], [place] [phone] Admissions: 8am-3pm M-F  Incentives Substance [place] [address],    [place], [place] [phone]   [facility] [address] [place], [place], [place]   [facility] [address],  [place], [place]   [facility] - Intensive Outpatient [address], [place], [place]   [facility] ([place].) [address].,  [place], [place] [phone] or [phone]   [facility] (RTS) [address] [place], [place] Accepts Medicaid  [facility] [place] [address]  [place] [phone] Substance Abuse/Addiction Treatment   [facility] Organization         Address  Phone  Notes  [facility]  [phone]   Domenic Schwab, PhD [address] Arlis [place], [place]   [phone] or [phone]   [place], [place] [phone]   [facility] [address],  [place], [place] [phone] Insurance/Medicaid/sponsorship through [facility] and Families [address], [address]                                    [place], [place] [phone] [place] [address], [place] [phone]    Dr. Adele Schilder  [phone]   [facility] Dept. 1) [address] [place] 2) [place] 3)  [place] [address], [place] [phone]  [phone]   [place] [phone] or [phone] (After Hours)

## 2013-10-16 ENCOUNTER — Encounter (HOSPITAL_COMMUNITY): Payer: Self-pay | Admitting: Emergency Medicine

## 2013-10-16 ENCOUNTER — Emergency Department (HOSPITAL_COMMUNITY)
Admission: EM | Admit: 2013-10-16 | Discharge: 2013-10-16 | Disposition: A | Discharge status: Home / Self Care | Payer: Medicare HMO | Attending: Emergency Medicine | Admitting: Emergency Medicine

## 2013-10-16 DIAGNOSIS — F17200 Nicotine dependence, unspecified, uncomplicated: Secondary | ICD-10-CM | POA: Insufficient documentation

## 2013-10-16 DIAGNOSIS — Z79899 Other long term (current) drug therapy: Secondary | ICD-10-CM | POA: Insufficient documentation

## 2013-10-16 DIAGNOSIS — K0889 Other specified disorders of teeth and supporting structures: Secondary | ICD-10-CM

## 2013-10-16 DIAGNOSIS — E119 Type 2 diabetes mellitus without complications: Secondary | ICD-10-CM | POA: Insufficient documentation

## 2013-10-16 DIAGNOSIS — G479 Sleep disorder, unspecified: Secondary | ICD-10-CM | POA: Insufficient documentation

## 2013-10-16 DIAGNOSIS — Z8659 Personal history of other mental and behavioral disorders: Secondary | ICD-10-CM | POA: Insufficient documentation

## 2013-10-16 DIAGNOSIS — Z792 Long term (current) use of antibiotics: Secondary | ICD-10-CM | POA: Insufficient documentation

## 2013-10-16 DIAGNOSIS — K089 Disorder of teeth and supporting structures, unspecified: Secondary | ICD-10-CM | POA: Insufficient documentation

## 2013-10-16 MED ORDER — HYDROCODONE-ACETAMINOPHEN 5-325 MG PO TABS: 1.0000 | ORAL_TABLET | Freq: Four times a day (QID) | ORAL | Status: DC | PRN

## 2013-10-16 NOTE — ED Notes (Addendum)
Pt presents today after being seen on 10/13/13, which the patient reports she was diagnosed with a tooth fracture to the left, lower, mid dental area. Pt states that the fractured portion of the tooth fell out on the 4/13. Pt states she was unaware of any referrals made to dentistry during her last visit. Pt states she is taking her antibiotics as prescribed, however the pain is worse now that the tooth has broken. Pt states the ibuprofen is making her nauseated, even with food. Pt also requests a referral to a dentist.

## 2013-10-16 NOTE — ED Provider Notes (Signed)
CSN: [id number]     Arrival date & time 10/16/13  1143 History  This chart was scribed for non-physician practitioner working with Jeannett Senior , by Allena Earing ED Scribe. This patient was seen in WTR6/WTR6 and the patient's care was started at 12:12 PM.    Chief Complaint  Patient presents with  . Dental Pain      HPI  HPI Comments: Kaitlyn Chambers is a 33 y.o. female who presents to the Emergency Department complaining of persistent, worsening dental pain that has been disturbing her sleep.She was seen on 10/13/13 in the ED for the same problem and was diagnosed with a fractured tooth,but the pain has worsened as a "piece of the tooth fell out 4/13". She has been  taking prescribed antibiotics and pain medication, reports little effect.  She states that "she used a pack of frozen hot dogs at 3am" to try and dull the pain. She has been rinsing her mouth with listerine.  Pt states that she is unable to see a dentist as she cannot afford one as she is on disability and going through the process of qualifying for medicaid.   Past Medical History  Diagnosis Date  . Bipolar 1 disorder, manic, full remission   . Anxiety   . PCOS (polycystic ovarian syndrome)   . Diabetes mellitus     boarderline diabetic  . Metabolic syndrome    Past Surgical History  Procedure Laterality Date  . Carpectomy     Family History  Problem Relation Age of Onset  . Diabetes Mother   . Heart failure Mother    History  Substance Use Topics  . Smoking status: Current Every Day Smoker -- 1.00 packs/day for 20 years    Types: Cigarettes  . Smokeless tobacco: Not on file  . Alcohol Use: No   OB History   Grav Para Term Preterm Abortions TAB SAB Ect Mult Living                 Review of Systems  HENT: Positive for dental problem (pain).   Psychiatric/Behavioral: Positive for sleep disturbance.  All other systems reviewed and are negative.     Allergies  Levaquin and Metoprolol  Home  Medications   Prior to Admission medications   Medication Sig Start Date End Date Taking? Authorizing Provider  acetaminophen (TYLENOL) 500 MG tablet Take 1,000 mg by mouth every 6 (six) hours as needed for mild pain, moderate pain or headache.   Yes Historical Provider, MD  clove oil liquid Apply topically as needed.   Yes Historical Provider, MD  furosemide (LASIX) 40 MG tablet Take 40 mg by mouth daily.   Yes Historical Provider, MD  HYDROcodone-acetaminophen (NORCO/VICODIN) 5-325 MG per tablet Take 1 tablet by mouth every 4 (four) hours as needed. 10/13/13  Yes Lauren Burnetta Sabin, PA-C  ibuprofen (ADVIL,MOTRIN) 800 MG tablet Take 1 tablet (800 mg total) by mouth 3 (three) times daily. Take with food 10/13/13  Yes Lauren Burnetta Sabin, PA-C  metFORMIN (GLUCOPHAGE) 1000 MG tablet Take 1,000 mg by mouth 2 (two) times daily with a meal.   Yes Historical Provider, MD  penicillin v potassium (VEETID) 500 MG tablet Take 1 tablet (500 mg total) by mouth 4 (four) times daily. 10/13/13  Yes Lauren Burnetta Sabin, PA-C   BP 165/88  Pulse 79  Temp(Src) 98.3 F (36.8 C) (Oral)  Resp 22  SpO2 97% Physical Exam  Nursing note and vitals reviewed. Constitutional: She is oriented to person, place,  and time. She appears well-developed and well-nourished. No distress.  HENT:  Head: Normocephalic and atraumatic.  Partially evolved left lower first premolar. No surround gum swelling or erythema. No swelling under the tongue or trismus.  Eyes: Conjunctivae are normal.  Neurological: She is alert and oriented to person, place, and time.  Skin: Skin is warm and dry.    ED Course  Procedures (including critical care time)  DIAGNOSTIC STUDIES: Oxygen Saturation is 97% on RA, normal by my interpretation.    COORDINATION OF CARE:   12:21 PM-Discussed treatment plan which includes a referral to a dentist and more pain medication with pt at bedside and pt agreed to plan.   Labs Review Labs Reviewed - No data to  display  Imaging Review No results found.   EKG Interpretation None      MDM   Final diagnoses:  Pain, dental   Patient with persistent dental pain, was seen here 3 days ago, ran out of pain medications, currently taking antibiotics. On exam tooth is partially chipped, no signs of infection at this time. Patient is requesting a referral to a dentist. I have given her referral for an on call dentist, Dr. Radford Pax. Also gave her 20 more tablets of Norco for pain. She is instructed to followup.   Filed Vitals:   10/16/13 1148  BP: 165/88  Pulse: 79  Temp: 98.3 F (36.8 C)  Resp: 22    I personally performed the services described in this documentation, which was scribed in my presence. The recorded information has been reviewed and is accurate.       Renold Genta, PA-C 10/16/13 1235

## 2013-10-16 NOTE — ED Provider Notes (Signed)
Medical screening examination/treatment/procedure(s) were performed by non-physician practitioner and as supervising physician I was immediately available for consultation/collaboration.   EKG Interpretation None       Threasa Beards, MD 10/16/13 1236

## 2013-10-16 NOTE — Discharge Instructions (Signed)
Continue pain medications. Follow up with a dentist.    Dental Pain Toothache is pain in or around a tooth. It may get worse with chewing or with cold or heat.  HOME CARE  Your dentist may use a numbing medicine during treatment. If so, you may need to avoid eating until the medicine wears off. Ask your dentist about this.  Only take medicine as told by your dentist or doctor.  Avoid chewing food near the painful tooth until after all treatment is done. Ask your dentist about this. GET HELP RIGHT AWAY IF:   The problem gets worse or new problems appear.  You have a fever.  There is redness and puffiness (swelling) of the face, jaw, or neck.  You cannot open your mouth.  There is pain in the jaw.  There is very bad pain that is not helped by medicine. MAKE SURE YOU:   Understand these instructions.  Will watch your condition.  Will get help right away if you are not doing well or get worse. Document Released: 12/07/2007 Document Revised: 09/12/2011 Document Reviewed: 12/07/2007 [facility] Patient Information 2014 [place], [place].

## 2013-10-16 NOTE — ED Provider Notes (Signed)
Medical screening examination/treatment/procedure(s) were performed by non-physician practitioner and as supervising physician I was immediately available for consultation/collaboration.   EKG Interpretation None        Saddie Benders. Dorna Mai, MD 10/16/13 [phone]

## 2013-12-05 ENCOUNTER — Encounter (HOSPITAL_COMMUNITY): Payer: Self-pay | Admitting: Emergency Medicine

## 2013-12-05 ENCOUNTER — Emergency Department (HOSPITAL_COMMUNITY): Payer: Medicare HMO

## 2013-12-05 ENCOUNTER — Emergency Department (HOSPITAL_COMMUNITY)
Admission: EM | Admit: 2013-12-05 | Discharge: 2013-12-05 | Disposition: A | Discharge status: Home / Self Care | Payer: Medicare HMO | Attending: Emergency Medicine | Admitting: Emergency Medicine

## 2013-12-05 DIAGNOSIS — Z3202 Encounter for pregnancy test, result negative: Secondary | ICD-10-CM | POA: Insufficient documentation

## 2013-12-05 DIAGNOSIS — Z79899 Other long term (current) drug therapy: Secondary | ICD-10-CM | POA: Insufficient documentation

## 2013-12-05 DIAGNOSIS — Z8659 Personal history of other mental and behavioral disorders: Secondary | ICD-10-CM | POA: Insufficient documentation

## 2013-12-05 DIAGNOSIS — E282 Polycystic ovarian syndrome: Secondary | ICD-10-CM

## 2013-12-05 DIAGNOSIS — N83209 Unspecified ovarian cyst, unspecified side: Secondary | ICD-10-CM

## 2013-12-05 DIAGNOSIS — F17200 Nicotine dependence, unspecified, uncomplicated: Secondary | ICD-10-CM | POA: Insufficient documentation

## 2013-12-05 LAB — CBC WITH DIFFERENTIAL/PLATELET
Basophils Absolute: 0 10*3/uL (ref 0.0–0.1)
Basophils Relative: 0 % (ref 0–1)
EOS ABS: 0.1 10*3/uL (ref 0.0–0.7)
EOS PCT: 1 % (ref 0–5)
HEMATOCRIT: 41.5 % (ref 36.0–46.0)
HEMOGLOBIN: 14.7 g/dL (ref 12.0–15.0)
LYMPHS ABS: 3.6 10*3/uL (ref 0.7–4.0)
Lymphocytes Relative: 40 % (ref 12–46)
MCH: 31.3 pg (ref 26.0–34.0)
MCHC: 35.4 g/dL (ref 30.0–36.0)
MCV: 88.5 fL (ref 78.0–100.0)
MONOS PCT: 3 % (ref 3–12)
Monocytes Absolute: 0.3 10*3/uL (ref 0.1–1.0)
Neutro Abs: 5 10*3/uL (ref 1.7–7.7)
Neutrophils Relative %: 56 % (ref 43–77)
Platelets: 190 10*3/uL (ref 150–400)
RBC: 4.69 MIL/uL (ref 3.87–5.11)
RDW: 12.6 % (ref 11.5–15.5)
WBC: 9 10*3/uL (ref 4.0–10.5)

## 2013-12-05 LAB — WET PREP, GENITAL
CLUE CELLS WET PREP: NONE SEEN
Trich, Wet Prep: NONE SEEN
WBC WET PREP: NONE SEEN
YEAST WET PREP: NONE SEEN

## 2013-12-05 LAB — BASIC METABOLIC PANEL
BUN: 8 mg/dL (ref 6–23)
CALCIUM: 8.9 mg/dL (ref 8.4–10.5)
CO2: 25 mEq/L (ref 19–32)
Chloride: 103 mEq/L (ref 96–112)
Creatinine, Ser: 0.65 mg/dL (ref 0.50–1.10)
GFR calc Af Amer: >90 mL/min (ref >90)
GLUCOSE: 164 mg/dL — AB (ref 70–99)
POTASSIUM: 3.8 meq/L (ref 3.7–5.3)
Sodium: 140 mEq/L (ref 137–147)

## 2013-12-05 LAB — URINE MICROSCOPIC-ADD ON

## 2013-12-05 LAB — URINALYSIS, ROUTINE W REFLEX MICROSCOPIC
BILIRUBIN URINE: NEGATIVE
GLUCOSE, UA: NEGATIVE mg/dL
KETONES UR: NEGATIVE mg/dL
Leukocytes, UA: NEGATIVE
Nitrite: NEGATIVE
PH: 6 (ref 5.0–8.0)
Protein, ur: NEGATIVE mg/dL
Specific Gravity, Urine: 1.008 (ref 1.005–1.030)
Urobilinogen, UA: 0.2 mg/dL (ref 0.0–1.0)

## 2013-12-05 LAB — PREGNANCY, URINE: Preg Test, Ur: NEGATIVE

## 2013-12-05 MED ORDER — OXYCODONE-ACETAMINOPHEN 5-325 MG PO TABS
2.0000 | ORAL_TABLET | Freq: Once | ORAL | Status: AC
  Administered 2013-12-05: 2 via ORAL
  Filled 2013-12-05: qty 2

## 2013-12-05 MED ORDER — ONDANSETRON 4 MG PO TBDP
4.0000 mg | ORAL_TABLET | Freq: Once | ORAL | Status: AC
  Administered 2013-12-05: 4 mg via ORAL
  Filled 2013-12-05: qty 1

## 2013-12-05 MED ORDER — HYDROCODONE-ACETAMINOPHEN 5-325 MG PO TABS: 1.0000 | ORAL_TABLET | ORAL | Status: DC | PRN

## 2013-12-05 NOTE — Discharge Instructions (Signed)

## 2013-12-05 NOTE — ED Notes (Signed)
Pt returned for [place].

## 2013-12-05 NOTE — ED Notes (Signed)
Patient transported to Ultrasound 

## 2013-12-05 NOTE — ED Provider Notes (Signed)
CSN: [id number]     Arrival date & time 12/05/13  1004 History   First MD Initiated Contact with Patient 12/05/13 1014     Chief Complaint  Patient presents with  . Vaginal Bleeding    increased vaginal bleeding on this menses  . Back Pain  . Abdominal Pain    N/V x 1 week     (Consider location/radiation/quality/duration/timing/severity/associated sxs/prior Treatment) HPI  Patient to the ER with complaints of intermittent, stabbing, lower abdominal pain and heavy menstrual flow for 1 day. It is not uncommon for her to have difficult periods due to having PCOS but she feels that this is much different. She has been trying to get pregnant and wonders if she is having a miscarriage. She reports feeling weak. Has not tried anything for pain. Pt has normal vital signs, is awake, alert and oriented.  Past Medical History  Diagnosis Date  . Bipolar 1 disorder, manic, full remission   . Anxiety   . PCOS (polycystic ovarian syndrome)   . Diabetes mellitus     boarderline diabetic  . Metabolic syndrome    Past Surgical History  Procedure Laterality Date  . Carpectomy     Family History  Problem Relation Age of Onset  . Diabetes Mother   . Heart failure Mother    History  Substance Use Topics  . Smoking status: Current Every Day Smoker -- 1.00 packs/day for 20 years    Types: Cigarettes  . Smokeless tobacco: Not on file  . Alcohol Use: No   OB History   Grav Para Term Preterm Abortions TAB SAB Ect Mult Living                 Review of Systems  Genitourinary: Positive for vaginal bleeding, menstrual problem and pelvic pain.  All other systems reviewed and are negative.     Allergies  Levaquin and Metoprolol  Home Medications   Prior to Admission medications   Medication Sig Start Date End Date Taking? Authorizing Provider  albuterol (PROVENTIL HFA;VENTOLIN HFA) 108 (90 BASE) MCG/ACT inhaler Inhale 2 puffs into the lungs every 6 (six) hours as needed for wheezing or  shortness of breath.   Yes Historical Provider, MD  furosemide (LASIX) 40 MG tablet Take 40 mg by mouth daily.   Yes Historical Provider, MD  ibuprofen (ADVIL,MOTRIN) 800 MG tablet Take 1 tablet (800 mg total) by mouth 3 (three) times daily. Take with food 10/13/13  Yes Lauren Burnetta Sabin, PA-C  metFORMIN (GLUCOPHAGE) 1000 MG tablet Take 1,000 mg by mouth 2 (two) times daily with a meal.   Yes Historical Provider, MD  OVER THE COUNTER MEDICATION Take 2 each by mouth daily.   Yes Historical Provider, MD  HYDROcodone-acetaminophen (NORCO/VICODIN) 5-325 MG per tablet Take 1-2 tablets by mouth every 4 (four) hours as needed. 12/05/13   Ellasyn Swilling Marilu Favre, PA-C   BP 126/66  Pulse 70  Temp(Src) 98.8 F (37.1 C) (Oral)  Resp 20  Wt 298 lb (135.172 kg)  SpO2 98%  LMP 12/04/2013 Physical Exam  Nursing note and vitals reviewed. Constitutional: She appears well-developed and well-nourished. No distress.  Morbidly obese  HENT:  Head: Normocephalic and atraumatic.  Eyes: Pupils are equal, round, and reactive to light.  Neck: Normal range of motion. Neck supple.  Cardiovascular: Normal rate and regular rhythm.   Pulmonary/Chest: Effort normal.  Abdominal: Soft. Bowel sounds are normal. There is tenderness in the suprapubic area. There is no guarding and no CVA tenderness.  Neurological: She is alert.  Skin: Skin is warm and dry.    ED Course  Procedures (including critical care time) Labs Review Labs Reviewed  URINALYSIS, ROUTINE W REFLEX MICROSCOPIC - Abnormal; Notable for the following:    Hgb urine dipstick LARGE (*)    All other components within normal limits  BASIC METABOLIC PANEL - Abnormal; Notable for the following:    Glucose, Bld 164 (*)    All other components within normal limits  WET PREP, GENITAL  GC/CHLAMYDIA PROBE AMP  PREGNANCY, URINE  CBC WITH DIFFERENTIAL  URINE MICROSCOPIC-ADD ON    Imaging Review US Transvaginal Non-ob  12/05/2013   CLINICAL DATA:  Abdominal and pelvic  pain  EXAM: TRANSVAGINAL ULTRASOUND OF PELVIS  TECHNIQUE: Transvaginal ultrasound examination of the pelvis was performed including evaluation of the uterus, ovaries, adnexal regions, and pelvic cul-de-sac.  COMPARISON:  None.  FINDINGS: Uterus  Measurements: 7.6 x 3.2 x 4.3 cm. No fibroids or other mass visualized. Incidental note is made of nabothian cyst.  Endometrium  Thickness: 6.9 cm.  No focal abnormality visualized.  Right ovary  Measurements: 4 x 2.4 x 2.7 cm. Normal appearance/no adnexal mass.  Left ovary  Measurements: 6 x 3.1 x 3.3 cm. Normal appearance/no adnexal mass. A partially complex 3.3 x 2.4 x 2.7 cm avascular cystic mass within the left ovary. Finding has the appearance of a hemorrhagic cyst.  Other findings: Trace amount of free fluid within the pelvis, physiologic.  IMPRESSION: 1. Small hemorrhagic cyst involving the left ovary. Considering the size and the patient's premenopausal status this is consistent with a physiologic finding and surveillance evaluation not needed. This recommendation follows the consensus statement: Management of Asymptomatic Ovarian and Other Adnexal Cysts Imaged at [place]: Society of Radiologists in [place]. Radiology 2010; [phone]. 2. Otherwise unremarkable pelvic ultrasound.   Electronically Signed   By: Margaree Mackintosh M.D.   On: 12/05/2013 12:38   US Pelvis Complete  12/05/2013   CLINICAL DATA:  Abdominal and pelvic pain  EXAM: TRANSVAGINAL ULTRASOUND OF PELVIS  TECHNIQUE: Transvaginal ultrasound examination of the pelvis was performed including evaluation of the uterus, ovaries, adnexal regions, and pelvic cul-de-sac.  COMPARISON:  None.  FINDINGS: Uterus  Measurements: 7.6 x 3.2 x 4.3 cm. No fibroids or other mass visualized. Incidental note is made of nabothian cyst.  Endometrium  Thickness: 6.9 cm.  No focal abnormality visualized.  Right ovary  Measurements: 4 x 2.4 x 2.7 cm. Normal appearance/no adnexal mass.  Left ovary   Measurements: 6 x 3.1 x 3.3 cm. Normal appearance/no adnexal mass. A partially complex 3.3 x 2.4 x 2.7 cm avascular cystic mass within the left ovary. Finding has the appearance of a hemorrhagic cyst.  Other findings: Trace amount of free fluid within the pelvis, physiologic.  IMPRESSION: 1. Small hemorrhagic cyst involving the left ovary. Considering the size and the patient's premenopausal status this is consistent with a physiologic finding and surveillance evaluation not needed. This recommendation follows the consensus statement: Management of Asymptomatic Ovarian and Other Adnexal Cysts Imaged at [place]: Society of Radiologists in [place]. Radiology 2010; [phone]. 2. Otherwise unremarkable pelvic ultrasound.   Electronically Signed   By: Margaree Mackintosh M.D.   On: 12/05/2013 12:38     EKG Interpretation None      MDM   Final diagnoses:  Hemorrhagic ovarian cyst  PCOD (polycystic ovarian disease)    Urine preg is negative. Her labs are unremarkable for emergency acute abnormalities.  Her ultrasound shows a hemorrhagic cyst which can be found in PCOS. I have given her pain medication here to manage her pain. Her hemoglobin is stable. She has been advised to return to her PCP or go to the [facility] hospital referral ive given her. Rx Pain medications for home.  33 y.o.Alegra Wray-Kitagawa's evaluation in the Emergency Department is complete. It has been determined that no acute conditions requiring further emergency intervention are present at this time. The patient/guardian have been advised of the diagnosis and plan. We have discussed signs and symptoms that warrant return to the ED, such as changes or worsening in symptoms.  Vital signs are stable at discharge. Filed Vitals:   12/05/13 1038  BP:   Pulse:   Temp: 98.8 F (37.1 C)  Resp:     Patient/guardian has voiced understanding and agreed to follow-up with the PCP or specialist.    Linus Mako, PA-C 12/05/13 1251

## 2013-12-05 NOTE — ED Notes (Signed)
Pt reports one week hx of intermittent stabbing pain in lower abdomin, nausea , vomiting, heavy menses x 1 day.  Pt was attempting to become pregnant. Denies trauma. Pt is alert oriented and appropriate

## 2013-12-07 LAB — GC/CHLAMYDIA PROBE AMP
CT Probe RNA: NEGATIVE
GC Probe RNA: NEGATIVE

## 2013-12-11 NOTE — ED Provider Notes (Signed)
Medical screening examination/treatment/procedure(s) were performed by non-physician practitioner and as supervising physician I was immediately available for consultation/collaboration.   EKG Interpretation None        Wandra Arthurs, MD 12/11/13 2137

## 2014-03-05 ENCOUNTER — Ambulatory Visit: Payer: Medicare Other | Admitting: Dietician

## 2014-04-21 ENCOUNTER — Ambulatory Visit: Payer: Medicare Other | Admitting: Dietician

## 2014-06-01 ENCOUNTER — Emergency Department (INDEPENDENT_AMBULATORY_CARE_PROVIDER_SITE_OTHER)
Admission: EM | Admit: 2014-06-01 | Discharge: 2014-06-01 | Disposition: A | Discharge status: Home / Self Care | Payer: Medicare HMO | Source: Home / Self Care | Attending: Family Medicine | Admitting: Family Medicine

## 2014-06-01 ENCOUNTER — Encounter (HOSPITAL_COMMUNITY): Payer: Self-pay | Admitting: Emergency Medicine

## 2014-06-01 DIAGNOSIS — H6981 Other specified disorders of Eustachian tube, right ear: Secondary | ICD-10-CM

## 2014-06-01 DIAGNOSIS — K088 Other specified disorders of teeth and supporting structures: Secondary | ICD-10-CM | POA: Diagnosis not present

## 2014-06-01 DIAGNOSIS — H6991 Unspecified Eustachian tube disorder, right ear: Secondary | ICD-10-CM

## 2014-06-01 DIAGNOSIS — K0889 Other specified disorders of teeth and supporting structures: Secondary | ICD-10-CM

## 2014-06-01 MED ORDER — AMOXICILLIN 500 MG PO CAPS: 500.0000 mg | ORAL_CAPSULE | Freq: Three times a day (TID) | ORAL | Status: DC

## 2014-06-01 MED ORDER — HYDROCODONE-ACETAMINOPHEN 5-325 MG PO TABS: 1.0000 | ORAL_TABLET | ORAL | Status: DC | PRN

## 2014-06-01 NOTE — ED Provider Notes (Signed)
CSN: [id number]     Arrival date & time 06/01/14  1451 History   First MD Initiated Contact with Patient 06/01/14 1505     Chief Complaint  Patient presents with  . Otalgia  . Dental Pain   (Consider location/radiation/quality/duration/timing/severity/associated sxs/prior Treatment) HPI Comments: Hx of dental fracture and loss of filling to the right lower canine tooth. Developed pain earlier this week getting worse last 2 days. Pain originates from right lower jaw/chin to the right ear.  Also pain to the right ear and posterior to the mandible.   Past Medical History  Diagnosis Date  . Bipolar 1 disorder, manic, full remission   . Anxiety   . PCOS (polycystic ovarian syndrome)   . Diabetes mellitus     boarderline diabetic  . Metabolic syndrome    Past Surgical History  Procedure Laterality Date  . Carpectomy     Family History  Problem Relation Age of Onset  . Diabetes Mother   . Heart failure Mother    History  Substance Use Topics  . Smoking status: Current Every Day Smoker -- 0.50 packs/day for 20 years    Types: Cigarettes  . Smokeless tobacco: Not on file  . Alcohol Use: No   OB History    No data available     Review of Systems  Constitutional: Negative.   HENT: Positive for dental problem, ear pain, postnasal drip and rhinorrhea.   All other systems reviewed and are negative.   Allergies  Levaquin and Metoprolol  Home Medications   Prior to Admission medications   Medication Sig Start Date End Date Taking? Authorizing Provider  ibuprofen (ADVIL,MOTRIN) 800 MG tablet Take 1 tablet (800 mg total) by mouth 3 (three) times daily. Take with food 10/13/13  Yes Harvie Heck, PA-C  metFORMIN (GLUCOPHAGE) 1000 MG tablet Take 1,000 mg by mouth 2 (two) times daily with a meal.   Yes Historical Provider, MD  albuterol (PROVENTIL HFA;VENTOLIN HFA) 108 (90 BASE) MCG/ACT inhaler Inhale 2 puffs into the lungs every 6 (six) hours as needed for wheezing or shortness  of breath.    Historical Provider, MD  amoxicillin (AMOXIL) 500 MG capsule Take 1 capsule (500 mg total) by mouth 3 (three) times daily. 06/01/14   Janne Napoleon, NP  furosemide (LASIX) 40 MG tablet Take 40 mg by mouth 2 (two) times daily.     Historical Provider, MD  HYDROcodone-acetaminophen (NORCO/VICODIN) 5-325 MG per tablet Take 1 tablet by mouth every 4 (four) hours as needed. 06/01/14   Janne Napoleon, NP   BP 141/89 mmHg  Pulse 89  Temp(Src) 98.4 F (36.9 C) (Oral)  Resp 20  SpO2 96%  LMP  Physical Exam  Constitutional: She is oriented to person, place, and time. She appears well-developed and well-nourished. No distress.  HENT:  Head: Normocephalic and atraumatic.  Mouth/Throat: Oropharynx is clear and moist. No oropharyngeal exudate.  Bilateral TM's retracted. Tenderess along the right ETD/post mandible soft tissue space. Dental fracture and tenderness to the right lower canine tooth. No signs of infection.    Eyes: Conjunctivae and EOM are normal.  Neck: Normal range of motion.  Lymphadenopathy:    She has no cervical adenopathy.  Neurological: She is alert and oriented to person, place, and time.  Skin: Skin is warm and dry.  Psychiatric: She has a normal mood and affect.  Nursing note and vitals reviewed.   ED Course  Procedures (including critical care time) Labs Review Labs Reviewed - No data to  display  Imaging Review No results found.   MDM   1. Toothache   2. ETD (eustachian tube dysfunction), right    norco 5 mg #15 Amoxicillin sudafec PE and claritin for PND     Janne Napoleon, NP 06/01/14 1521

## 2014-06-01 NOTE — Discharge Instructions (Signed)
Dental Pain Sudafed PE and Claritin for congestion A tooth ache may be caused by cavities (tooth decay). Cavities expose the nerve of the tooth to air and hot or cold temperatures. It may come from an infection or abscess (also called a boil or furuncle) around your tooth. It is also often caused by dental caries (tooth decay). This causes the pain you are having. DIAGNOSIS  Your caregiver can diagnose this problem by exam. TREATMENT   If caused by an infection, it may be treated with medications which kill germs (antibiotics) and pain medications as prescribed by your caregiver. Take medications as directed.  Only take over-the-counter or prescription medicines for pain, discomfort, or fever as directed by your caregiver.  Whether the tooth ache today is caused by infection or dental disease, you should see your dentist as soon as possible for further care. SEEK MEDICAL CARE IF: The exam and treatment you received today has been provided on an emergency basis only. This is not a substitute for complete medical or dental care. If your problem worsens or new problems (symptoms) appear, and you are unable to meet with your dentist, call or return to this location. SEEK IMMEDIATE MEDICAL CARE IF:   You have a fever.  You develop redness and swelling of your face, jaw, or neck.  You are unable to open your mouth.  You have severe pain uncontrolled by pain medicine. MAKE SURE YOU:   Understand these instructions.  Will watch your condition.  Will get help right away if you are not doing well or get worse. Document Released: 06/20/2005 Document Revised: 09/12/2011 Document Reviewed: 02/06/2008 [facility] Patient Information 2015 [place], [place]. This information is not intended to replace advice given to you by your health care provider. Make sure you discuss any questions you have with your health care provider.

## 2014-06-01 NOTE — ED Notes (Signed)
Pt states that she has had ear and tooth pain for over a week.

## 2014-06-02 ENCOUNTER — Encounter (HOSPITAL_COMMUNITY): Payer: Self-pay | Admitting: Emergency Medicine

## 2014-06-02 ENCOUNTER — Emergency Department (HOSPITAL_COMMUNITY)
Admission: EM | Admit: 2014-06-02 | Discharge: 2014-06-02 | Disposition: A | Discharge status: Home / Self Care | Payer: Medicare HMO | Attending: Emergency Medicine | Admitting: Emergency Medicine

## 2014-06-02 DIAGNOSIS — K047 Periapical abscess without sinus: Secondary | ICD-10-CM

## 2014-06-02 DIAGNOSIS — Z720 Tobacco use: Secondary | ICD-10-CM | POA: Diagnosis not present

## 2014-06-02 DIAGNOSIS — X58XXXA Exposure to other specified factors, initial encounter: Secondary | ICD-10-CM | POA: Insufficient documentation

## 2014-06-02 DIAGNOSIS — Y929 Unspecified place or not applicable: Secondary | ICD-10-CM | POA: Diagnosis not present

## 2014-06-02 DIAGNOSIS — S025XXA Fracture of tooth (traumatic), initial encounter for closed fracture: Secondary | ICD-10-CM | POA: Insufficient documentation

## 2014-06-02 DIAGNOSIS — Z791 Long term (current) use of non-steroidal anti-inflammatories (NSAID): Secondary | ICD-10-CM | POA: Diagnosis not present

## 2014-06-02 DIAGNOSIS — Z8659 Personal history of other mental and behavioral disorders: Secondary | ICD-10-CM | POA: Insufficient documentation

## 2014-06-02 DIAGNOSIS — Z79899 Other long term (current) drug therapy: Secondary | ICD-10-CM | POA: Diagnosis not present

## 2014-06-02 DIAGNOSIS — R05 Cough: Secondary | ICD-10-CM | POA: Insufficient documentation

## 2014-06-02 DIAGNOSIS — Y998 Other external cause status: Secondary | ICD-10-CM | POA: Diagnosis not present

## 2014-06-02 DIAGNOSIS — E119 Type 2 diabetes mellitus without complications: Secondary | ICD-10-CM | POA: Diagnosis not present

## 2014-06-02 DIAGNOSIS — Z792 Long term (current) use of antibiotics: Secondary | ICD-10-CM | POA: Insufficient documentation

## 2014-06-02 DIAGNOSIS — R220 Localized swelling, mass and lump, head: Secondary | ICD-10-CM

## 2014-06-02 DIAGNOSIS — R6884 Jaw pain: Secondary | ICD-10-CM | POA: Diagnosis present

## 2014-06-02 DIAGNOSIS — Y939 Activity, unspecified: Secondary | ICD-10-CM | POA: Diagnosis not present

## 2014-06-02 LAB — CBG MONITORING, ED: GLUCOSE-CAPILLARY: 105 mg/dL — AB (ref 70–99)

## 2014-06-02 MED ORDER — DIPHENHYDRAMINE HCL 25 MG PO CAPS
50.0000 mg | ORAL_CAPSULE | Freq: Once | ORAL | Status: AC
  Administered 2014-06-02: 50 mg via ORAL
  Filled 2014-06-02: qty 2

## 2014-06-02 MED ORDER — CLINDAMYCIN HCL 150 MG PO CAPS: 450.0000 mg | ORAL_CAPSULE | Freq: Three times a day (TID) | ORAL | Status: DC

## 2014-06-02 MED ORDER — HYDROCODONE-ACETAMINOPHEN 5-325 MG PO TABS
2.0000 | ORAL_TABLET | Freq: Once | ORAL | Status: AC
  Administered 2014-06-02: 2 via ORAL
  Filled 2014-06-02: qty 2

## 2014-06-02 NOTE — Discharge Instructions (Signed)
Return to the emergency room with worsening of symptoms, new symptoms or with symptoms that are concerning, especially fevers, increase in swelling or pain, pain with eye movements, swelling under tongue or tenderness. Start taking clindamycin. Stop taking amoxicillin. Take benadryl 50mg  every 4-6 hours prophylactically. norco for pain. Do not operate machinery, drive or drink alcohol while taking narcotics or muscle relaxers. Follow-up with a dentist as soon as possible. Use below resources to find a dentist. Please take all of your antibiotics until finished!   You may develop abdominal discomfort or diarrhea from the antibiotic.  You may help offset this with probiotics which you can buy or get in yogurt. Do not eat  or take the probiotics until 2 hours after your antibiotic.    Dental Abscess A dental abscess is a collection of infected fluid (pus) from a bacterial infection in the inner part of the tooth (pulp). It usually occurs at the end of the tooth's root.  CAUSES   Severe tooth decay.  Trauma to the tooth that allows bacteria to enter into the pulp, such as a broken or chipped tooth. SYMPTOMS   Severe pain in and around the infected tooth.  Swelling and redness around the abscessed tooth or in the mouth or face.  Tenderness.  Pus drainage.  Bad breath.  Bitter taste in the mouth.  Difficulty swallowing.  Difficulty opening the mouth.  Nausea.  Vomiting.  Chills.  Swollen neck glands. DIAGNOSIS   A medical and dental history will be taken.  An examination will be performed by tapping on the abscessed tooth.  X-rays may be taken of the tooth to identify the abscess. TREATMENT The goal of treatment is to eliminate the infection. You may be prescribed antibiotic medicine to stop the infection from spreading. A root canal may be performed to save the tooth. If the tooth cannot be saved, it may be pulled (extracted) and the abscess may be drained.  HOME CARE  INSTRUCTIONS  Only take over-the-counter or prescription medicines for pain, fever, or discomfort as directed by your caregiver.  Rinse your mouth (gargle) often with salt water ( tsp salt in 8 oz [250 ml] of warm water) to relieve pain or swelling.  Do not drive after taking pain medicine (narcotics).  Do not apply heat to the outside of your face.  Return to your dentist for further treatment as directed. SEEK MEDICAL CARE IF:  Your pain is not helped by medicine.  Your pain is getting worse instead of better. SEEK IMMEDIATE MEDICAL CARE IF:  You have a fever or persistent symptoms for more than 2-3 days.  You have a fever and your symptoms suddenly get worse.  You have chills or a very bad headache.  You have problems breathing or swallowing.  You have trouble opening your mouth.  You have swelling in the neck or around the eye. Document Released: 06/20/2005 Document Revised: 03/14/2012 Document Reviewed: 09/28/2010 [facility] Patient Information 2015 [place], [place]. This information is not intended to replace advice given to you by your health care provider. Make sure you discuss any questions you have with your health care provider.    Emergency Department Resource Guide 1) Find a Doctor and Pay Out of Pocket Although you won't have to find out who is covered by your insurance plan, it is a good idea to ask around and get recommendations. You will then need to call the office and see if the doctor you have chosen will accept you as a  new patient and what types of options they offer for patients who are self-pay. Some doctors offer discounts or will set up payment plans for their patients who do not have insurance, but you will need to ask so you aren't surprised when you get to your appointment.  2) Contact Your Local Health Department Not all health departments have doctors that can see patients for sick visits, but many do, so it is worth a call to see if yours does. If  you don't know where your local health department is, you can check in your phone book. The CDC also has a tool to help you locate your state's health department, and many state websites also have listings of all of their local health departments.  3) Find a [facility] If your illness is not likely to be very severe or complicated, you may want to try a walk in clinic. These are popping up all over the country in pharmacies, drugstores, and shopping centers. They're usually staffed by nurse practitioners or physician assistants that have been trained to treat common illnesses and complaints. They're usually fairly quick and inexpensive. However, if you have serious medical issues or chronic medical problems, these are probably not your best option.  No Primary Care Doctor: - Call Health Connect at  [phone] - they can help you locate a primary care doctor that  accepts your insurance, provides certain services, etc. - Physician Referral Service- [phone]  Chronic Pain Problems: Organization         Address  Phone   Notes  [facility]  [phone] Patients need to be referred by their primary care doctor.   Medication Assistance: Organization         Address  Phone   Notes  [facility] Medication [facility] [address] [place], [place] [address] [phone] --Must be a resident of [facility] -- Must have NO insurance coverage whatsoever (no Medicaid/ Medicare, etc.) -- The pt. MUST have a primary care doctor that directs their care regularly and follows them in the community   MedAssist  [phone]   [employer]  [phone]    Agencies that provide inexpensive medical care: Organization         Address  Phone   Notes  [place]  [phone]   [facility]    [phone]   [facility] [place], [place] [address] [phone]   [place] [address], [place] [phone]   Planned Parenthood    [phone]   [facility]    [phone]   [place] and [place] [address], [place] Phone:  [phone], Fax:  [phone] Hours of Operation:  9 am - 6 pm, M-F.  Also accepts Medicaid/Medicare and self-pay.  [facility] for [place] [address], [address], [place] Phone: [phone], Fax: [phone]. Hours of Operation:  8:30 am - 5:30 pm, M-F.  Also accepts Medicaid and self-pay.  [facility] [place] [address], [place] Phone: [phone]   [place], [place], [place] [phone], Ext. [phone] Mondays & Thursdays: 7-9 AM.  First 15 patients are seen on a first come, first serve basis.    [place] Providers:  Organization         Address  Phone   Notes  [facility] [address], [place] [phone] Also accepts self-pay patients.  [facility] [address], [place]  [phone]   [place], [address], [place] [phone]   [facility] [address] [place] [phone]   Lucianne Lei [address], [place]   [phone] Only accepts [place] Access [place] patients after they have their name applied to their card.   Self-Pay (no insurance) in [facility]:  Organization         Address  Phone   Notes  Sickle Cell Patients, [facility] [place] [phone]   [facility] Urgent Care [place] [phone]   [facility] [place]  [place], [place], [place] [phone]   [facility]/Dr. Osei-Bonsu  [address] [place] or [address], [place] [phone]  Phone number for both [place] and [place] locations is the same.  [facility] [address] [place] [phone]   [facility] [address], [place] or [address] [phone]   [facility] [address], [place] [phone], phone; [phone], fax Sees patients 1st and 3rd Saturday of every month.  Must not qualify for public or private insurance (i.e. Medicaid, Medicare, [place] Health Choice, Veterans' Benefits)  Household income should be no more than 200% of the poverty level The clinic cannot treat you if you are pregnant or think you are pregnant  Sexually transmitted diseases are not treated at the clinic.    Dental Care: Organization         Address  Phone  Notes  [facility] Department of [facility] [phone] Accepts children up to age 63 who are enrolled in [place] or [place]; pregnant women with a Medicaid card; and children who have applied for Medicaid or [place] Health Choice, but were declined, whose parents can pay a reduced fee at time of service.  [facility] Department of [facility]  [address], [place] [phone] Accepts children up to age 14 who are enrolled in [place] or [place]; pregnant women with a Medicaid card; and children who have applied for Medicaid or [place] Health Choice, but were declined, whose parents can pay a reduced fee at time of service.  [place] [place] Adult [facility] Access PROGRAM  [place] [phone] Patients are seen by appointment only. Walk-ins are not accepted. [place] will see patients 71 years of age and older. Monday - Tuesday (8am-5pm) Most Wednesdays (8:30-5pm) $30 per visit, cash only  [facility] Adult Dental Access PROGRAM  [address], [facility] [phone] Patients are seen by appointment  only. Walk-ins are not accepted. [place] will see patients 71 years of age and older. One Wednesday Evening (Monthly: Volunteer Based).  $30 per visit, cash only  [place]  [phone] for adults; Children under age 54, call Graduate Pediatric Dentistry at [phone]. Children aged 37-14, please call [phone] to request a pediatric application.  Dental services are provided in all areas of dental care including fillings, crowns  and bridges, complete and partial dentures, implants, gum treatment, root canals, and extractions. Preventive care is also provided. Treatment is provided to both adults and children. Patients are selected via a lottery and there is often a waiting list.   [facility] [address], [place]  [phone] [email or web address]   [facility] [address] [place], [place] [phone], Ext. [phone] Second and Fourth Thursday of each month, opens at 6:30 AM; Clinic ends at 9 AM.  Patients are seen on a first-come first-served basis, and a limited number are seen during each clinic.   [facility]  [address] Hillard Danker [place], [place] [phone]   Eligibility Requirements You must have lived in [place], [place], or [place] counties for at least the last three months.   You cannot be eligible for state or federal sponsored [employer], including [employer], [place], or [employer].   You generally cannot be eligible for healthcare insurance through your employer.    How to apply: Eligibility screenings are held every Tuesday and Wednesday afternoon from 1:00 pm until 4:00 pm. You do not need an appointment for the interview!  [facility] [address], [place], [place]   [place]  [place] Department  [place]  [phone]    Behavioral Health  Resources in the Community: Intensive Outpatient Programs Organization         Address  Phone  Notes  [place]. [address] [place], [place] [phone]   [facility] Outpatient [address], [place], [place]   ADS: Alcohol & Drug Svcs [address], [place], [place]   [place] [address],  [place], [place] or [phone]   Substance Abuse Resources Organization         Address  Phone  Notes  Alcohol and Drug Services  [phone]   [place]  [phone]   [facility] [place]   Chinita Pester  [phone]   Residential & Outpatient Substance Abuse Program  [phone]   Psychological Services Organization         Address  Phone  Notes  [facility] [place]  [place]  [phone]   [place] [address], [place] or [phone]    Mobile Crisis Teams Organization         Address  Phone  Notes  [facility], Mobile Crisis Care Unit  [phone]   [facility]  [address] [place], [place]   [place] [address] [place] [phone]    Self-Help/Support Groups Organization         Address  Phone             Notes  [place]. of [place] - variety of support groups  [place] Call for more information  Narcotics Anonymous (NA), [facility] [address], [employer] [place]  2 meetings at this location   Special educational needs teacher         Address  Phone  Notes  [facility] Residential Treatment [place],    [place]  [place]  [address] [place], [place], [place]   [place], [place] [phone]  Admissions: 8am-3pm M-F  Incentives Substance [place] [address],    [place], [place] [phone]   [facility] [address] [place], [place], [place]   [facility] [address],  [place], [place]   [facility] Programs - Intensive Outpatient [address] Kristeen [address], [place], [place]   [facility] [place].,  [place], [place] [phone] or [phone]   [facility] Services (RTS) [address], [place], [place] Accepts Medicaid  [facility] [place] [address],  [place] [phone] Substance Abuse/Addiction Treatment   [facility] Organization         Address  Phone  Notes  [facility]  [phone]   Domenic Schwab, PhD [address] Arlis Port[place], [place]   [phone] or [phone]   [place], [place] [phone]   [facility] [address] [place], [place] [phone] Insurance/Medicaid/sponsorship through [facility] and Families [address], [address]                                    [place], [place] [phone] [place] [address], [place] [phone]    Dr. Adele Schilder  [phone]   [facility] Dept. 1) [address], [place] 2) [place] 3)  [place] [address], [place] [phone]  [phone]   [place] [phone] or [phone] (After Hours)

## 2014-06-02 NOTE — ED Provider Notes (Signed)
CSN: [id number]     Arrival date & time 06/02/14  1048 History   First MD Initiated Contact with Patient 06/02/14 1112     Chief Complaint  Patient presents with  . Jaw Pain     (Consider location/radiation/quality/duration/timing/severity/associated sxs/prior Treatment) HPI  Kaitlyn Chambers is a 33 y.o. female with PMH of bipolar, DM, metabolic syndrome, anxiety, PCOS presenting with right-sided jaw swelling and pain that worsened this morning. She was seen by urgent care yesterday and diagnosed with otitis media and prescribed amoxicillin. She has taken 3 doses of amoxicillin as well as Norco. She states her symptoms have not improved. She is concerned this is an allergic reaction. She denies difficulty breathing or shortness of breath. No throat tightness or swelling. No nausea or vomiting or cutaneous reaction. No abdominal tenderness. He should also with toothache and dental fracture of right lower tooth. A shunt denies visual changes. Pt notes a low grade fever. Pt notes chronic cough without worsening. She is a smoker.   Past Medical History  Diagnosis Date  . Bipolar 1 disorder, manic, full remission   . Anxiety   . PCOS (polycystic ovarian syndrome)   . Diabetes mellitus     boarderline diabetic  . Metabolic syndrome    Past Surgical History  Procedure Laterality Date  . Carpectomy     Family History  Problem Relation Age of Onset  . Diabetes Mother   . Heart failure Mother    History  Substance Use Topics  . Smoking status: Current Every Day Smoker -- 0.50 packs/day for 20 years    Types: Cigarettes  . Smokeless tobacco: Not on file  . Alcohol Use: No   OB History    No data available     Review of Systems  Constitutional: Positive for fever. Negative for chills.  HENT: Negative for congestion and rhinorrhea.   Eyes: Negative for visual disturbance.  Respiratory: Positive for cough. Negative for shortness of breath.   Cardiovascular: Negative for chest  pain.  Gastrointestinal: Negative for nausea, vomiting and diarrhea.  Musculoskeletal: Negative for back pain and gait problem.  Skin: Negative for rash.  Neurological: Negative for weakness and headaches.      Allergies  Levaquin and Metoprolol  Home Medications   Prior to Admission medications   Medication Sig Start Date End Date Taking? Authorizing Provider  albuterol (PROVENTIL HFA;VENTOLIN HFA) 108 (90 BASE) MCG/ACT inhaler Inhale 2 puffs into the lungs every 6 (six) hours as needed for wheezing or shortness of breath.   Yes Historical Provider, MD  amoxicillin (AMOXIL) 500 MG capsule Take 1 capsule (500 mg total) by mouth 3 (three) times daily. 06/01/14  Yes Janne Napoleon, NP  furosemide (LASIX) 40 MG tablet Take 40 mg by mouth 2 (two) times daily.    Yes Historical Provider, MD  HYDROcodone-acetaminophen (NORCO/VICODIN) 5-325 MG per tablet Take 1 tablet by mouth every 4 (four) hours as needed. 06/01/14  Yes Janne Napoleon, NP  ibuprofen (ADVIL,MOTRIN) 800 MG tablet Take 1 tablet (800 mg total) by mouth 3 (three) times daily. Take with food 10/13/13  Yes Harvie Heck, PA-C  metFORMIN (GLUCOPHAGE) 1000 MG tablet Take 1,000 mg by mouth 2 (two) times daily with a meal.   Yes Historical Provider, MD  clindamycin (CLEOCIN) 150 MG capsule Take 3 capsules (450 mg total) by mouth 3 (three) times daily. 06/02/14   Jordan Hawks L Kia Varnadore, PA-C   BP 128/88 mmHg  Pulse 69  Temp(Src) 97.7 F (36.5 C) (Oral)  Resp 20  SpO2 96% Physical Exam  Constitutional: She appears well-developed and well-nourished. No distress.  HENT:  Head: Normocephalic and atraumatic.  Right Ear: Tympanic membrane is erythematous and retracted.  Left Ear: Tympanic membrane normal.  Nose: Right sinus exhibits no maxillary sinus tenderness and no frontal sinus tenderness. Left sinus exhibits no maxillary sinus tenderness and no frontal sinus tenderness.  Mouth/Throat: Mucous membranes are normal. No oropharyngeal exudate,  posterior oropharyngeal edema or posterior oropharyngeal erythema.  No trismus. No swelling or tenderness under tongue. No foot swelling. Mild facial swelling over right mandible without eye involvement. No neck swelling or masses noted. Poor dentition with multiple missing teeth. Right lower tooth fractured. Mildly erythematous gums without drainable abscess noted.  Eyes: Conjunctivae and EOM are normal. Right eye exhibits no discharge. Left eye exhibits no discharge.  Neck: Normal range of motion. Neck supple.  Cardiovascular: Normal rate, regular rhythm and normal heart sounds.   Pulmonary/Chest: Effort normal and breath sounds normal. No respiratory distress. She has no wheezes. She has no rales.  Abdominal: Soft. Bowel sounds are normal. She exhibits no distension. There is no tenderness.  Lymphadenopathy:    She has cervical adenopathy.  Neurological: She is alert.  Skin: Skin is warm and dry. She is not diaphoretic.  Nursing note and vitals reviewed.   ED Course  Procedures (including critical care time) Labs Review Labs Reviewed  CBG MONITORING, ED - Abnormal; Notable for the following:    Glucose-Capillary 105 (*)    All other components within normal limits    Imaging Review No results found.   EKG Interpretation None      Meds given in ED:  Medications  diphenhydrAMINE (BENADRYL) capsule 50 mg (50 mg Oral Given 06/02/14 1154)  HYDROcodone-acetaminophen (NORCO/VICODIN) 5-325 MG per tablet 2 tablet (2 tablets Oral Given 06/02/14 1154)    Discharge Medication List as of 06/02/2014 12:20 PM    START taking these medications   Details  clindamycin (CLEOCIN) 150 MG capsule Take 3 capsules (450 mg total) by mouth 3 (three) times daily., Starting 06/02/2014, Until Discontinued, Print          MDM   Final diagnoses:  Dental infection  Right facial swelling   Pt with history of DM on metformin, CBG 105 in ED. Pt with history of otitis media in right ear with 3  doses of amoxicillin. Pt also with dental infection. Pt concerned that mild right facial swelling over mandible is due to allergic reaction to amoxicillin. I doubt this as pt has no other symptoms, no cutaneous rash or oropharynx swelling. I doubt this is an allergic reaction but will give Benadryl and change her antibiotic. I believe her facial swelling due to dental infection.  Exam nonconcerning for Ludwig's angina. No trismus, neck masses or swelling/tenderness under tongue. Will treat with clindamycin as above. Pt urged to follow up with a dentist. ED resources provided.   Discussed return precautions with patient. Discussed all results and patient verbalizes understanding and agrees with plan.     Pura Spice, PA-C 06/02/14 [place], MD 06/03/14 [phone]

## 2014-06-02 NOTE — ED Notes (Signed)
Pt report jaw swelling and pain started this am. Pt was seen yesterday at urgent care for earache and toothache, pt was prescribed amoxicillin 500 mg. Pt took three doses of amoxicillin. Pt states she is afraid this is allergic reaction to atb. No signs of angioedema, pt breathing WDL.

## 2014-07-02 ENCOUNTER — Emergency Department (HOSPITAL_COMMUNITY)
Admission: EM | Admit: 2014-07-02 | Discharge: 2014-07-02 | Disposition: A | Discharge status: Home / Self Care | Payer: Medicare HMO | Attending: Emergency Medicine | Admitting: Emergency Medicine

## 2014-07-02 ENCOUNTER — Encounter (HOSPITAL_COMMUNITY): Payer: Self-pay | Admitting: Emergency Medicine

## 2014-07-02 ENCOUNTER — Emergency Department (HOSPITAL_COMMUNITY): Payer: Medicare HMO

## 2014-07-02 DIAGNOSIS — Z3202 Encounter for pregnancy test, result negative: Secondary | ICD-10-CM | POA: Insufficient documentation

## 2014-07-02 DIAGNOSIS — R42 Dizziness and giddiness: Secondary | ICD-10-CM | POA: Insufficient documentation

## 2014-07-02 DIAGNOSIS — Z9119 Patient's noncompliance with other medical treatment and regimen: Secondary | ICD-10-CM | POA: Diagnosis not present

## 2014-07-02 DIAGNOSIS — Z79899 Other long term (current) drug therapy: Secondary | ICD-10-CM | POA: Insufficient documentation

## 2014-07-02 DIAGNOSIS — Z8659 Personal history of other mental and behavioral disorders: Secondary | ICD-10-CM | POA: Insufficient documentation

## 2014-07-02 DIAGNOSIS — Z792 Long term (current) use of antibiotics: Secondary | ICD-10-CM | POA: Insufficient documentation

## 2014-07-02 DIAGNOSIS — Z791 Long term (current) use of non-steroidal anti-inflammatories (NSAID): Secondary | ICD-10-CM | POA: Insufficient documentation

## 2014-07-02 DIAGNOSIS — R0602 Shortness of breath: Secondary | ICD-10-CM | POA: Diagnosis present

## 2014-07-02 DIAGNOSIS — R002 Palpitations: Secondary | ICD-10-CM | POA: Diagnosis not present

## 2014-07-02 DIAGNOSIS — Z720 Tobacco use: Secondary | ICD-10-CM | POA: Insufficient documentation

## 2014-07-02 DIAGNOSIS — R0600 Dyspnea, unspecified: Secondary | ICD-10-CM | POA: Insufficient documentation

## 2014-07-02 LAB — URINALYSIS, ROUTINE W REFLEX MICROSCOPIC
BILIRUBIN URINE: NEGATIVE
GLUCOSE, UA: NEGATIVE mg/dL
Hgb urine dipstick: NEGATIVE
KETONES UR: NEGATIVE mg/dL
Leukocytes, UA: NEGATIVE
Nitrite: NEGATIVE
Protein, ur: NEGATIVE mg/dL
Specific Gravity, Urine: 1.009 (ref 1.005–1.030)
Urobilinogen, UA: 0.2 mg/dL (ref 0.0–1.0)
pH: 6.5 (ref 5.0–8.0)

## 2014-07-02 LAB — BASIC METABOLIC PANEL
Anion gap: 8 (ref 5–15)
BUN: 13 mg/dL (ref 6–23)
CO2: 26 mmol/L (ref 19–32)
Calcium: 9.2 mg/dL (ref 8.4–10.5)
Chloride: 103 mEq/L (ref 96–112)
Creatinine, Ser: 0.75 mg/dL (ref 0.50–1.10)
GFR calc non Af Amer: >90 mL/min (ref >90)
Glucose, Bld: 90 mg/dL (ref 70–99)
Potassium: 3.5 mmol/L (ref 3.5–5.1)
Sodium: 137 mmol/L (ref 135–145)

## 2014-07-02 LAB — CBC
HCT: 45.5 % (ref 36.0–46.0)
Hemoglobin: 16.3 g/dL — ABNORMAL HIGH (ref 12.0–15.0)
MCH: 31.8 pg (ref 26.0–34.0)
MCHC: 35.8 g/dL (ref 30.0–36.0)
MCV: 88.9 fL (ref 78.0–100.0)
PLATELETS: 214 10*3/uL (ref 150–400)
RBC: 5.12 MIL/uL — AB (ref 3.87–5.11)
RDW: 12.6 % (ref 11.5–15.5)
WBC: 11.5 10*3/uL — AB (ref 4.0–10.5)

## 2014-07-02 LAB — BRAIN NATRIURETIC PEPTIDE: B NATRIURETIC PEPTIDE 5: 28.5 pg/mL (ref 0.0–100.0)

## 2014-07-02 LAB — I-STAT TROPONIN, ED: TROPONIN I, POC: 0 ng/mL (ref 0.00–0.08)

## 2014-07-02 LAB — D-DIMER, QUANTITATIVE (NOT AT ARMC)

## 2014-07-02 LAB — POC URINE PREG, ED: Preg Test, Ur: NEGATIVE

## 2014-07-02 LAB — TSH: TSH: 5.296 u[IU]/mL — ABNORMAL HIGH (ref 0.350–4.500)

## 2014-07-02 NOTE — ED Notes (Signed)
Pt c/o shob, heart racing and dizziness that started about an hour ago.  Pt states that she was standing outside her son's doctor office waiting for a ride when the [facility] started.  Pt has COPD, CHF, DM.

## 2014-07-02 NOTE — Progress Notes (Signed)
  CARE MANAGEMENT ED NOTE 07/02/2014  Patient:  Kaitlyn Chambers, Kaitlyn Chambers   Account Number:  [id number]  Date Initiated:  07/02/2014  Documentation initiated by:  Livia Snellen  Subjective/Objective Assessment:   Patient presents to Ed with dizziness, shortness of breath, and herat plapitations     Subjective/Objective Assessment Detail:   Patient with pmhx of Bipolar, anxiety, DM     Action/Plan:   Patient discharged with referral to cardiology   Action/Plan Detail:   Anticipated DC Date:  07/02/2014     Status Recommendation to Physician:   Result of Recommendation:      [place]  Other  PCP issues    Choice offered to / List presented to:            Status of service:  Completed, signed off  ED Comments:   ED Comments Detail:  EDCM spoke to patient at bedside.  Patient reports she does nto have a pcp with [employer] Medicare insurnace.  EDCM discussed importance and purpose of pcp.  [facility] provided patient with list of pcps who accept [employer] within a 20 mile radius of patient's zip code [phone].  Patient reports she is concerned about transportation because she does not have a car.  [facility] provided patient with phone number for Medicare tansport [place].  Patient to make follow up appointment with [facility] Cardiovascular group in two days.  Patient is aware that she is to follow up with cardiologist and will call them tomorrow for an appointment.  Patient thankful for resources.  No further EDCM needs at this time.

## 2014-07-02 NOTE — ED Provider Notes (Signed)
CSN: [id number]     Arrival date & time 07/02/14  1511 History   First MD Initiated Contact with Patient 07/02/14 1530     Chief Complaint  Patient presents with  . Shortness of Breath  . Dizziness     (Consider location/radiation/quality/duration/timing/severity/associated sxs/prior Treatment) HPI The patient poor she's had several episodes of palpitations this week that have been associated with a feeling of dyspnea. She reports she feels like her heart is fluttering or pounding. She denies chest pain in association with these episodes. However she reports once that starts she gets the sensation that she has to take a lot of deep breaths. She also poor she becomes dizzy in association with this and describes it as a predominantly vertiginous kind of dizziness with some staggering. She denies associated headache. There is no associated focal weakness numbness or tingling. She had an episode today while at her child's physician's office. She reports that the episode lasted approximately 20 minutes. She has intermittently had palpitations over the past years. She reports however in the past she's never had the associated symptoms of shortness of breath and dizziness. The patient reports that she has been becoming very concerned because she has a bad family history of early onset cardiac disease and sudden death. She also reports that she had been noncompliant with medications up until about one week ago. She reports the reason for this was because she wasn't feeling very well when she was taking the medications and thought that they might be the cause. The patient had continued to take Lasix as needed for intermittent lower extremity swelling. The medications that she just resumed as of last week are Synthroid , Fenfibronate, Prilosec, Metformin (for PCO). At this time she reports that she still doesn't feel well. She still has a sense of anxiety. She does report she currently has multiple life  stressors. She reports however she does not have any chest pain currently and does not feel short of breath.  Family history: Mother with early onset cardiac disease first cardiac stents in her 22s or 14s. Mother deceased in her early 41s from multiple medical problems. Patient's aunt sudden death in her early 66s due to cardiac dysrhythmia. Past Medical History  Diagnosis Date  . Bipolar 1 disorder, manic, full remission   . Anxiety   . PCOS (polycystic ovarian syndrome)   . Diabetes mellitus     boarderline diabetic  . Metabolic syndrome    Past Surgical History  Procedure Laterality Date  . Carpectomy     Family History  Problem Relation Age of Onset  . Diabetes Mother   . Heart failure Mother    History  Substance Use Topics  . Smoking status: Current Every Day Smoker -- 0.50 packs/day for 20 years    Types: Cigarettes  . Smokeless tobacco: Not on file  . Alcohol Use: No   OB History    No data available     Review of Systems 10 Systems reviewed and are negative for acute change except as noted in the HPI.    Allergies  Levaquin and Metoprolol  Home Medications   Prior to Admission medications   Medication Sig Start Date End Date Taking? Authorizing Provider  albuterol (PROVENTIL HFA;VENTOLIN HFA) 108 (90 BASE) MCG/ACT inhaler Inhale 2 puffs into the lungs every 6 (six) hours as needed for wheezing or shortness of breath (wheezing).    Yes Historical Provider, MD  fenofibrate (TRICOR) 145 MG tablet Take 145 mg by mouth  daily.   Yes Historical Provider, MD  furosemide (LASIX) 40 MG tablet Take 40 mg by mouth 2 (two) times daily.    Yes Historical Provider, MD  levothyroxine (SYNTHROID, LEVOTHROID) 25 MCG tablet Take 25 mcg by mouth daily before breakfast.   Yes Historical Provider, MD  metFORMIN (GLUCOPHAGE) 1000 MG tablet Take 1,000 mg by mouth 2 (two) times daily with a meal.   Yes Historical Provider, MD  omeprazole (PRILOSEC) 20 MG capsule Take 20 mg by mouth  daily.   Yes Historical Provider, MD  amoxicillin (AMOXIL) 500 MG capsule Take 1 capsule (500 mg total) by mouth 3 (three) times daily. Patient not taking: Reported on 07/02/2014 06/01/14   Janne Napoleon, NP  clindamycin (CLEOCIN) 150 MG capsule Take 3 capsules (450 mg total) by mouth 3 (three) times daily. Patient not taking: Reported on 07/02/2014 06/02/14   Pura Spice, PA-C  HYDROcodone-acetaminophen (NORCO/VICODIN) 5-325 MG per tablet Take 1 tablet by mouth every 4 (four) hours as needed. 06/01/14   Janne Napoleon, NP  ibuprofen (ADVIL,MOTRIN) 800 MG tablet Take 1 tablet (800 mg total) by mouth 3 (three) times daily. Take with food 10/13/13   Harvie Heck, PA-C   BP 140/88 mmHg  Pulse 79  Temp(Src) 97.6 F (36.4 C) (Oral)  Resp 18  SpO2 99% Physical Exam  Constitutional: She is oriented to person, place, and time.  The patient is morbidly obese. She is alert and nontoxic. She has no acute respiratory distress. Her mental status is clear. She is well in appearance.  HENT:  Head: Normocephalic and atraumatic.  Mouth/Throat: Oropharynx is clear and moist.  Eyes: EOM are normal. Pupils are equal, round, and reactive to light. Right eye exhibits no discharge. Left eye exhibits no discharge.  Neck: Neck supple. No thyromegaly present.  Cardiovascular: Normal rate, regular rhythm, normal heart sounds and intact distal pulses.   Pulmonary/Chest: Effort normal and breath sounds normal.  Abdominal: Soft. Bowel sounds are normal. She exhibits no distension. There is no tenderness.  Musculoskeletal: Normal range of motion. She exhibits no edema or tenderness.  Lower extremities are in very good condition without changes of chronic venous stasis. The calves are soft and nontender. Distal pulses are 2+ and symmetric.  Neurological: She is alert and oriented to person, place, and time. She has normal strength. Coordination normal. GCS eye subscore is 4. GCS verbal subscore is 5. GCS motor subscore is  6.  Skin: Skin is warm, dry and intact.  Psychiatric: She has a normal mood and affect.    ED Course  Procedures (including critical care time) Labs Review Labs Reviewed  CBC - Abnormal; Notable for the following:    WBC 11.5 (*)    RBC 5.12 (*)    Hemoglobin 16.3 (*)    All other components within normal limits  BRAIN NATRIURETIC PEPTIDE  D-DIMER, QUANTITATIVE  URINALYSIS, ROUTINE W REFLEX MICROSCOPIC  BASIC METABOLIC PANEL  TSH  T4, FREE  I-STAT TROPOININ, ED  I-STAT TROPOININ, ED  POC URINE PREG, ED    Imaging Review Dg Chest 2 View (if Patient Has Fever And/or Copd)  07/02/2014   CLINICAL DATA:  Shortness of breath, dizziness, chronic cough. Smoker, COPD.  EXAM: CHEST  2 VIEW  COMPARISON:  None.  FINDINGS: The heart size and mediastinal contours are within normal limits. Both lungs are clear. The visualized skeletal structures are unremarkable.  IMPRESSION: No active cardiopulmonary disease.   Electronically Signed   By: Rolm Baptise M.D.  On: 07/02/2014 16:40     EKG Interpretation   Date/Time:  Wednesday July 02 2014 15:21:27 EST Ventricular Rate:  82 PR Interval:  174 QRS Duration: 83 QT Interval:  358 QTC Calculation: 418 R Axis:   74 Text Interpretation:  Sinus rhythm Low voltage, precordial leads no STEMI.  no change from prior. Confirmed by Johnney Killian, MD, Jeannie Done [phone]) on  07/02/2014 5:28:54 PM      MDM   Final diagnoses:  Heart palpitations  Dyspnea   At this point the patient has normal cardiac enzymes and EKG. D-dimer is negative. The patient has a waxing and waning symptoms of palpitations starting some 5 years ago, she describes having worn a Holter monitor however never having followed up on the results.. The dizziness that the patient describes in association with this as a vertiginous quality and not a presyncopal quality. There is no chest pain associated with her symptoms. The patient does report a significant anxiety component with  multiple stressors in her life at this point in time. Currently I do not identify acute ischemic symptoms. At this point I do feel the patient is safe to follow-up with cardiology on an outpatient basis. She is counseled on the necessity of close follow-up and on signs and symptoms for which to return to the emergency department.    Charlesetta Shanks, MD 07/02/14 [phone]

## 2014-07-02 NOTE — Discharge Instructions (Signed)

## 2014-07-03 LAB — T4, FREE: Free T4: 1 ng/dL (ref 0.80–1.80)

## 2015-08-07 DIAGNOSIS — J449 Chronic obstructive pulmonary disease, unspecified: Secondary | ICD-10-CM | POA: Diagnosis not present

## 2015-08-13 DIAGNOSIS — J449 Chronic obstructive pulmonary disease, unspecified: Secondary | ICD-10-CM | POA: Diagnosis not present

## 2015-08-31 ENCOUNTER — Encounter (HOSPITAL_COMMUNITY): Payer: Self-pay

## 2015-08-31 ENCOUNTER — Emergency Department (HOSPITAL_COMMUNITY)
Admission: EM | Admit: 2015-08-31 | Discharge: 2015-08-31 | Disposition: A | Discharge status: Home / Self Care | Payer: Medicare HMO | Attending: Emergency Medicine | Admitting: Emergency Medicine

## 2015-08-31 ENCOUNTER — Emergency Department (HOSPITAL_COMMUNITY): Payer: Medicare HMO

## 2015-08-31 DIAGNOSIS — Z7984 Long term (current) use of oral hypoglycemic drugs: Secondary | ICD-10-CM | POA: Diagnosis not present

## 2015-08-31 DIAGNOSIS — R062 Wheezing: Secondary | ICD-10-CM | POA: Diagnosis not present

## 2015-08-31 DIAGNOSIS — R69 Illness, unspecified: Secondary | ICD-10-CM | POA: Diagnosis not present

## 2015-08-31 DIAGNOSIS — R0789 Other chest pain: Secondary | ICD-10-CM | POA: Diagnosis not present

## 2015-08-31 DIAGNOSIS — F419 Anxiety disorder, unspecified: Secondary | ICD-10-CM | POA: Diagnosis not present

## 2015-08-31 DIAGNOSIS — M549 Dorsalgia, unspecified: Secondary | ICD-10-CM | POA: Diagnosis not present

## 2015-08-31 DIAGNOSIS — F3174 Bipolar disorder, in full remission, most recent episode manic: Secondary | ICD-10-CM | POA: Insufficient documentation

## 2015-08-31 DIAGNOSIS — J441 Chronic obstructive pulmonary disease with (acute) exacerbation: Secondary | ICD-10-CM | POA: Diagnosis not present

## 2015-08-31 DIAGNOSIS — Z791 Long term (current) use of non-steroidal anti-inflammatories (NSAID): Secondary | ICD-10-CM | POA: Diagnosis not present

## 2015-08-31 DIAGNOSIS — F17210 Nicotine dependence, cigarettes, uncomplicated: Secondary | ICD-10-CM | POA: Diagnosis not present

## 2015-08-31 DIAGNOSIS — E669 Obesity, unspecified: Secondary | ICD-10-CM | POA: Insufficient documentation

## 2015-08-31 DIAGNOSIS — Z79899 Other long term (current) drug therapy: Secondary | ICD-10-CM | POA: Diagnosis not present

## 2015-08-31 DIAGNOSIS — R0781 Pleurodynia: Secondary | ICD-10-CM

## 2015-08-31 DIAGNOSIS — R05 Cough: Secondary | ICD-10-CM | POA: Diagnosis not present

## 2015-08-31 MED ORDER — TRAMADOL HCL 50 MG PO TABS: 50.0000 mg | ORAL_TABLET | Freq: Four times a day (QID) | ORAL | Status: DC | PRN

## 2015-08-31 MED ORDER — PREDNISONE 50 MG PO TABS: 50.0000 mg | ORAL_TABLET | Freq: Every day | ORAL | Status: DC

## 2015-08-31 MED ORDER — HYDROCODONE-ACETAMINOPHEN 5-325 MG PO TABS
1.0000 | ORAL_TABLET | ORAL | Status: AC
  Administered 2015-08-31: 1 via ORAL
  Filled 2015-08-31: qty 1

## 2015-08-31 NOTE — ED Notes (Signed)
Pt. Presents with complaint of cough x 2 days with R back pain. Pt. States pain worsens in back with coughing and deep breathing. States feels like pulled muscle. Pt. With hx of COPD.

## 2015-08-31 NOTE — Discharge Instructions (Signed)
Chest Wall Pain °Chest wall pain is pain in or around the bones and muscles of your chest. Sometimes, an injury causes this pain. Sometimes, the cause may not be known. This pain may take several weeks or longer to get better. °HOME CARE °Pay attention to any changes in your symptoms. Take these actions to help with your pain: °· Rest as told by your doctor. °· Avoid activities that cause pain. Try not to use your chest, belly (abdominal), or side muscles to lift heavy things. °· If directed, apply ice to the painful area: °¨ Put ice in a plastic bag. °¨ Place a towel between your skin and the bag. °¨ Leave the ice on for 20 minutes, 2-3 times per day. °· Take over-the-counter and prescription medicines only as told by your doctor. °· Do not use tobacco products, including cigarettes, chewing tobacco, and e-cigarettes. If you need help quitting, ask your doctor. °· Keep all follow-up visits as told by your doctor. This is important. °GET HELP IF: °· You have a fever. °· Your chest pain gets worse. °· You have new symptoms. °GET HELP RIGHT AWAY IF: °· You feel sick to your stomach (nauseous) or you throw up (vomit). °· You feel sweaty or light-headed. °· You have a cough with phlegm (sputum) or you cough up blood. °· You are short of breath. °  °This information is not intended to replace advice given to you by your health care provider. Make sure you discuss any questions you have with your health care provider. °  °Document Released: 12/07/2007 Document Revised: 03/11/2015 Document Reviewed: 09/15/2014 °Elsevier Interactive Patient Education ©2016 [employer] ° °

## 2015-08-31 NOTE — ED Provider Notes (Signed)
CSN: [id number]     Arrival date & time 08/31/15  0815 History   First MD Initiated Contact with Patient 08/31/15 0827     Chief Complaint  Patient presents with  . Cough  . Back Pain   HPI Pt has been having pain in her right back below the shoulder blade.  It started two days ago.  She recently came down with a uri. She has been coughing a  Lot and wondered if she pulled a muscle. Deep breathing and coughing increases the pain. No fevers.  No shortness of breath.  No change in lower extremity swelling.  No rashes. This morning the pain was more severe so she came into the emergency room for evaluation.  Patient is a smoker. She does have history of COPD. No history of cardiac disease or pulmonary embolism. Past Medical History  Diagnosis Date  . Bipolar 1 disorder, manic, full remission ([place])   . Anxiety   . PCOS (polycystic ovarian syndrome)   . Diabetes mellitus     boarderline diabetic  . Metabolic syndrome    Past Surgical History  Procedure Laterality Date  . Carpectomy     Family History  Problem Relation Age of Onset  . Diabetes Mother   . Heart failure Mother    Social History  Substance Use Topics  . Smoking status: Current Every Day Smoker -- 0.50 packs/day for 20 years    Types: Cigarettes  . Smokeless tobacco: None  . Alcohol Use: No   OB History    No data available     Review of Systems  All other systems reviewed and are negative.     Allergies  Levaquin and Metoprolol  Home Medications   Prior to Admission medications   Medication Sig Start Date End Date Taking? Authorizing Provider  albuterol (PROVENTIL HFA;VENTOLIN HFA) 108 (90 BASE) MCG/ACT inhaler Inhale 2 puffs into the lungs every 6 (six) hours as needed for wheezing or shortness of breath (wheezing).    Yes Historical Provider, MD  clonazePAM (KLONOPIN) 1 MG tablet Take 1 mg by mouth daily as needed for anxiety.   Yes Historical Provider, MD  FLUoxetine (PROZAC) 20 MG capsule Take 60 mg  by mouth daily.   Yes Historical Provider, MD  furosemide (LASIX) 40 MG tablet Take 40 mg by mouth 2 (two) times daily.    Yes Historical Provider, MD  ibuprofen (ADVIL,MOTRIN) 800 MG tablet Take 1 tablet (800 mg total) by mouth 3 (three) times daily. Take with food 10/13/13  Yes Harvie Heck, PA-C  levothyroxine (SYNTHROID, LEVOTHROID) 25 MCG tablet Take 25 mcg by mouth daily before breakfast.   Yes Historical Provider, MD  magnesium 30 MG tablet Take 30 mg by mouth 2 (two) times daily.   Yes Historical Provider, MD  metFORMIN (GLUCOPHAGE) 1000 MG tablet Take 1,000 mg by mouth 2 (two) times daily with a meal.   Yes Historical Provider, MD  Multiple Vitamins-Minerals (MULTIVITAMIN WITH MINERALS) tablet Take 1 tablet by mouth daily.   Yes Historical Provider, MD  omeprazole (PRILOSEC) 20 MG capsule Take 20 mg by mouth daily.   Yes Historical Provider, MD  prazosin (MINIPRESS) 5 MG capsule Take 5 mg by mouth at bedtime.   Yes Historical Provider, MD  propranolol (INDERAL) 10 MG tablet Take 10 mg by mouth 2 (two) times daily as needed (anxiety).   Yes Historical Provider, MD  QUEtiapine (SEROQUEL) 25 MG tablet Take 25 mg by mouth at bedtime.   Yes Historical  Provider, MD  fenofibrate (TRICOR) 145 MG tablet Take 145 mg by mouth daily.    Historical Provider, MD  predniSONE (DELTASONE) 50 MG tablet Take 1 tablet (50 mg total) by mouth daily. 08/31/15   Dorie Rank, MD  traMADol (ULTRAM) 50 MG tablet Take 1 tablet (50 mg total) by mouth every 6 (six) hours as needed. 08/31/15   Dorie Rank, MD   BP 135/85 mmHg  Pulse 78  Temp(Src) 98.5 F (36.9 C) (Oral)  Resp 22  SpO2 98%  LMP 07/12/2015 (Within Days) Physical Exam  Constitutional: No distress.  Obese  HENT:  Head: Normocephalic and atraumatic.  Right Ear: External ear normal.  Left Ear: External ear normal.  Eyes: Conjunctivae are normal. Right eye exhibits no discharge. Left eye exhibits no discharge. No scleral icterus.  Neck: Neck supple. No  tracheal deviation present.  Cardiovascular: Normal rate, regular rhythm and intact distal pulses.   Pulmonary/Chest: Effort normal. No stridor. No respiratory distress. She has wheezes (occasional very mild wheezes). She has no rales. She exhibits no tenderness.  Abdominal: Soft. Bowel sounds are normal. She exhibits no distension. There is no tenderness. There is no rebound and no guarding.  Musculoskeletal: She exhibits no edema or tenderness.  Neurological: She is alert. She has normal strength. No cranial nerve deficit (no facial droop, extraocular movements intact, no slurred speech) or sensory deficit. She exhibits normal muscle tone. She displays no seizure activity. Coordination normal.  Skin: Skin is warm and dry. No rash noted.  Psychiatric: She has a normal mood and affect.  Nursing note and vitals reviewed.   ED Course  Procedures (including critical care time) Labs Review Labs Reviewed - No data to display  Imaging Review Dg Chest 2 View  08/31/2015  CLINICAL DATA:  Cough, right-sided chest pain for the past week, history of COPD, current smoker. EXAM: CHEST  2 VIEW COMPARISON:  PA and lateral chest x-ray of July 02, 2014 FINDINGS: The lungs are well-expanded. The interstitial markings are coarse similar to those seen in the past. There is subtle increased density in the left infrahilar region and left lower lobe as compared to the previous study. No air bronchograms are observed. There is no pleural effusion. The heart and pulmonary vascularity are normal. The mediastinum is normal in width. The thoracic vertebral bodies are preserved in height where visualized. The observed portions of the right ribs are normal. IMPRESSION: Subsegmental atelectasis in the left infrahilar region and left lower lobe. There is no alveolar pneumonia. Mild interstitial prominence bilaterally is stable and likely reflects the patient's smoking history. Electronically Signed   By: David  Martinique M.D.    On: 08/31/2015 08:56   I have personally reviewed and evaluated these images and lab results as part of my medical decision-making.   MDM   Final diagnoses:  Pleuritic chest pain    The patient's pain increases with certain movements, coughing and deep breathing. She has had recent URI symptoms and a cough. Chest x-ray does not show pneumonia. Symptoms may be related to an intercostal muscle strain from the coughing versus pleurisy.  She is not short of breath Or tachycardic. I doubt pulmonary embolism. Doubt cardiac issue.  Discharge home with medications for pain. She does have an inhaler. Considering her persistent coughing and slight wheezes I will give her prescription for oral steroids.    Dorie Rank, MD 08/31/15 1034

## 2015-09-10 DIAGNOSIS — J449 Chronic obstructive pulmonary disease, unspecified: Secondary | ICD-10-CM | POA: Diagnosis not present

## 2015-10-11 DIAGNOSIS — J449 Chronic obstructive pulmonary disease, unspecified: Secondary | ICD-10-CM | POA: Diagnosis not present

## 2015-10-18 ENCOUNTER — Encounter (HOSPITAL_COMMUNITY): Payer: Self-pay | Admitting: *Deleted

## 2015-10-18 ENCOUNTER — Inpatient Hospital Stay (HOSPITAL_COMMUNITY)
Admission: AD | Admit: 2015-10-18 | Discharge: 2015-10-19 | Disposition: A | Discharge status: Home / Self Care | Payer: Medicare HMO | Source: Ambulatory Visit | Attending: Obstetrics and Gynecology | Admitting: Obstetrics and Gynecology

## 2015-10-18 DIAGNOSIS — N926 Irregular menstruation, unspecified: Secondary | ICD-10-CM | POA: Insufficient documentation

## 2015-10-18 DIAGNOSIS — N939 Abnormal uterine and vaginal bleeding, unspecified: Secondary | ICD-10-CM | POA: Diagnosis present

## 2015-10-18 DIAGNOSIS — R69 Illness, unspecified: Secondary | ICD-10-CM | POA: Diagnosis not present

## 2015-10-18 DIAGNOSIS — Z881 Allergy status to other antibiotic agents status: Secondary | ICD-10-CM | POA: Diagnosis not present

## 2015-10-18 DIAGNOSIS — Z3202 Encounter for pregnancy test, result negative: Secondary | ICD-10-CM

## 2015-10-18 DIAGNOSIS — F419 Anxiety disorder, unspecified: Secondary | ICD-10-CM | POA: Insufficient documentation

## 2015-10-18 DIAGNOSIS — E282 Polycystic ovarian syndrome: Secondary | ICD-10-CM | POA: Insufficient documentation

## 2015-10-18 DIAGNOSIS — N946 Dysmenorrhea, unspecified: Secondary | ICD-10-CM | POA: Diagnosis not present

## 2015-10-18 DIAGNOSIS — Z888 Allergy status to other drugs, medicaments and biological substances status: Secondary | ICD-10-CM | POA: Insufficient documentation

## 2015-10-18 DIAGNOSIS — E119 Type 2 diabetes mellitus without complications: Secondary | ICD-10-CM | POA: Diagnosis not present

## 2015-10-18 DIAGNOSIS — F17210 Nicotine dependence, cigarettes, uncomplicated: Secondary | ICD-10-CM | POA: Insufficient documentation

## 2015-10-18 LAB — CBC
HCT: 40.7 % (ref 36.0–46.0)
Hemoglobin: 14.5 g/dL (ref 12.0–15.0)
MCH: 32.1 pg (ref 26.0–34.0)
MCHC: 35.6 g/dL (ref 30.0–36.0)
MCV: 90 fL (ref 78.0–100.0)
Platelets: 226 10*3/uL (ref 150–400)
RBC: 4.52 MIL/uL (ref 3.87–5.11)
RDW: 13.6 % (ref 11.5–15.5)
WBC: 13.3 10*3/uL — AB (ref 4.0–10.5)

## 2015-10-18 LAB — POCT PREGNANCY, URINE: PREG TEST UR: NEGATIVE

## 2015-10-18 MED ORDER — HYDROMORPHONE HCL 1 MG/ML IJ SOLN
1.0000 mg | Freq: Once | INTRAMUSCULAR | Status: AC
  Administered 2015-10-18: 1 mg via INTRAMUSCULAR
  Filled 2015-10-18: qty 1

## 2015-10-18 NOTE — MAU Note (Signed)
Pt reports she had 2 positive home preg test last week. Today had sudden onset lower abd pain and then started bleeding.

## 2015-10-18 NOTE — MAU Provider Note (Signed)
History     CSN: [id number]  Arrival date and time: 10/18/15 2122   First Provider Initiated Contact with Patient 10/18/15 2221      Chief Complaint  Patient presents with  . Vaginal Bleeding  . Abdominal Pain   HPI Ms. Kaitlyn Chambers is a 35 y.o. No obstetric history on file. at Unknown who presents to MAU today with complaint of vaginal bleeding and possible pregnancy. She states +HPT last week. She states that she started to have lower abdominal pain earlier tonight and then noted sudden onset of heavy vaginal bleeding. Patient reports soaking 3 pads in 45 minutes, but states bleeding has slowed significantly since onset. She continues to have suprapubic pain rated at 8/10 now. She has not taken anything for pain. She has a history of PCOS and HTN. She has had regular periods recently except for this last one. LMP 08/31/15. She is not currently on medication for HTN. She denies headache, regular blurred vision or chest pain.    OB History    Gravida Para Term Preterm AB TAB SAB Ectopic Multiple Living   1  0 0 1 0 1 0 0 0      Past Medical History  Diagnosis Date  . Bipolar 1 disorder, manic, full remission ([place])   . Anxiety   . PCOS (polycystic ovarian syndrome)   . Diabetes mellitus     boarderline diabetic  . Metabolic syndrome     Past Surgical History  Procedure Laterality Date  . Carpectomy      Family History  Problem Relation Age of Onset  . Diabetes Mother   . Heart failure Mother     Social History  Substance Use Topics  . Smoking status: Current Every Day Smoker -- 0.50 packs/day for 20 years    Types: Cigarettes  . Smokeless tobacco: None  . Alcohol Use: No    Allergies:  Allergies  Allergen Reactions  . Levaquin [Levofloxacin]     Rash   . Metoprolol Nausea And Vomiting    Sweats, nausea, dizziness, vomitting    No prescriptions prior to admission    Review of Systems  Constitutional: Negative for fever and malaise/fatigue.   Gastrointestinal: Positive for abdominal pain. Negative for nausea, vomiting, diarrhea and constipation.  Genitourinary: Negative for dysuria, urgency and frequency.       + vaginal bleeding   Physical Exam   Blood pressure 152/89, pulse 75, temperature 98.6 F (37 C), temperature source Oral, resp. rate 20, height 5\' 7"  (1.702 m), weight 339 lb (153.769 kg), last menstrual period 08/31/2015, SpO2 100 %.  Physical Exam  Nursing note and vitals reviewed. Constitutional: She is oriented to person, place, and time. She appears well-developed and well-nourished. No distress.  HENT:  Head: Normocephalic and atraumatic.  Cardiovascular: Normal rate.   Respiratory: Effort normal.  GI: Soft. She exhibits no distension and no mass. There is tenderness (very mild tenderness to palpation of the lower abdomen at midline). There is no rebound and no guarding.  Genitourinary:  No blood on pad after > 1 hour  Neurological: She is alert and oriented to person, place, and time.  Skin: Skin is warm and dry. No erythema.  Psychiatric: She has a normal mood and affect.     Results for orders placed or performed during the hospital encounter of 10/18/15 (from the past 24 hour(s))  Pregnancy, urine POC     Status: None   Collection Time: 10/18/15 10:22 PM  Result Value Ref  Range   Preg Test, Ur NEGATIVE NEGATIVE  hCG, quantitative, pregnancy     Status: None   Collection Time: 10/18/15 10:36 PM  Result Value Ref Range   hCG, Beta Chain, Quant, S <1 <5 mIU/mL  CBC     Status: Abnormal   Collection Time: 10/18/15 10:36 PM  Result Value Ref Range   WBC 13.3 (H) 4.0 - 10.5 K/uL   RBC 4.52 3.87 - 5.11 MIL/uL   Hemoglobin 14.5 12.0 - 15.0 g/dL   HCT 40.7 36.0 - 46.0 %   MCV 90.0 78.0 - 100.0 fL   MCH 32.1 26.0 - 34.0 pg   MCHC 35.6 30.0 - 36.0 g/dL   RDW 13.6 11.5 - 15.5 %   Platelets 226 150 - 400 K/uL  ABO/Rh     Status: None (Preliminary result)   Collection Time: 10/18/15 10:36 PM  Result  Value Ref Range   ABO/RH(D) A POS     MAU Course  Procedures None  MDM UPT - negative CBC, quant hCG and ABO/Rh today Quant hCG repeated and < 1 mIU/mL Discussed patient with Dr. Dellis Filbert. She agrees with plan of care at this time. Patient may be discharged with bleeding precautions and advised to take Ibuprofen PRN for pain.   Assessment and Plan  A: PCOS Irregular periods Negative pregnancy test Dysmenorrhea   P: Discharge home Ibuprofen PRN for pain advised Bleeding precautions discussed Patient advised to follow-up with Dr. Garwin Brothers as scheduled or sooner PRN Patient may return to MAU as needed or if her condition were to change or worsen   Luvenia Redden, PA-C  10/19/2015, 1:00 AM

## 2015-10-19 DIAGNOSIS — N946 Dysmenorrhea, unspecified: Secondary | ICD-10-CM

## 2015-10-19 DIAGNOSIS — Z3202 Encounter for pregnancy test, result negative: Secondary | ICD-10-CM | POA: Diagnosis not present

## 2015-10-19 LAB — ABO/RH: ABO/RH(D): A POS

## 2015-10-19 LAB — HCG, QUANTITATIVE, PREGNANCY: hCG, Beta Chain, Quant, S: <1 m[IU]/mL (ref <5)

## 2015-10-19 NOTE — Discharge Instructions (Signed)
Dysmenorrhea Dysmenorrhea is pain during a menstrual period. You will have pain in the lower belly (abdomen). The pain is caused by the tightening (contracting) of the muscles of the uterus. The pain can be minor or severe. Headache, feeling sick to your stomach (nausea), throwing up (vomiting), or low back pain may occur with this condition. HOME CARE  Only take medicine as told by your doctor.  Place a heating pad or hot water bottle on your lower back or belly. Do not sleep with a heating pad.  Exercise may help lessen the pain.  Massage the lower back or belly.  Stop smoking.  Avoid alcohol and caffeine. GET HELP IF:   Your pain does not get better with medicine.  You have pain during sex.  Your pain gets worse while taking pain medicine.  Your period bleeding is heavier than normal.  You keep feeling sick to your stomach or keep throwing up. GET HELP RIGHT AWAY IF: You pass out (faint).   This information is not intended to replace advice given to you by your health care provider. Make sure you discuss any questions you have with your health care provider.   Document Released: 09/16/2008 Document Revised: 06/25/2013 Document Reviewed: 12/06/2012 Elsevier Interactive Patient Education 2016 [employer] Polycystic Ovarian Syndrome Polycystic ovarian syndrome (PCOS) is a common hormonal disorder among women of reproductive age. Most women with PCOS grow many small cysts on their ovaries. PCOS can cause problems with your periods and make it difficult to get pregnant. It can also cause an increased risk of miscarriage with pregnancy. If left untreated, PCOS can lead to serious health problems, such as diabetes and heart disease. CAUSES The cause of PCOS is not fully understood, but genetics may be a factor. SIGNS AND SYMPTOMS   Infrequent or no menstrual periods.   Inability to get pregnant (infertility) because of not ovulating.   Increased growth of hair on the face,  chest, stomach, back, thumbs, thighs, or toes.   Acne, oily skin, or dandruff.   Pelvic pain.   Weight gain or obesity, usually carrying extra weight around the waist.   Type 2 diabetes.   High cholesterol.   High blood pressure.   Female-pattern baldness or thinning hair.   Patches of thickened and dark brown or black skin on the neck, arms, breasts, or thighs.   Tiny excess flaps of skin (skin tags) in the armpits or neck area.   Excessive snoring and having breathing stop at times while asleep (sleep apnea).   Deepening of the voice.   Gestational diabetes when pregnant.  DIAGNOSIS  There is no single test to diagnose PCOS.   Your health care provider will:   Take a medical history.   Perform a pelvic exam.   Have ultrasonography done.   Check your female and female hormone levels.   Measure glucose or sugar levels in the blood.   Do other blood tests.   If you are producing too many female hormones, your health care provider will make sure it is from PCOS. At the physical exam, your health care provider will want to evaluate the areas of increased hair growth. Try to allow natural hair growth for a few days before the visit.   During a pelvic exam, the ovaries may be enlarged or swollen because of the increased number of small cysts. This can be seen more easily by using vaginal ultrasonography or screening to examine the ovaries and lining of the uterus (endometrium) for  cysts. The uterine lining may become thicker if you have not been having a regular period.  TREATMENT  Because there is no cure for PCOS, it needs to be managed to prevent problems. Treatments are based on your symptoms. Treatment is also based on whether you want to have a baby or whether you need contraception.  Treatment may include:   Progesterone hormone to start a menstrual period.   Birth control pills to make you have regular menstrual periods.   Medicines to make  you ovulate, if you want to get pregnant.   Medicines to control your insulin.   Medicine to control your blood pressure.   Medicine and diet to control your high cholesterol and triglycerides in your blood.  Medicine to reduce excessive hair growth.  Surgery, making small holes in the ovary, to decrease the amount of female hormone production. This is done through a long, lighted tube (laparoscope) placed into the pelvis through a tiny incision in the lower abdomen.  HOME CARE INSTRUCTIONS  Only take over-the-counter or prescription medicine as directed by your health care provider.  Pay attention to the foods you eat and your activity levels. This can help reduce the effects of PCOS.  Keep your weight under control.  Eat foods that are low in carbohydrate and high in fiber.  Exercise regularly. SEEK MEDICAL CARE IF:  Your symptoms do not get better with medicine.  You have new symptoms.   This information is not intended to replace advice given to you by your health care provider. Make sure you discuss any questions you have with your health care provider.   Document Released: 10/14/2004 Document Revised: 04/10/2013 Document Reviewed: 12/06/2012 Elsevier Interactive Patient Education [employer].

## 2015-11-10 DIAGNOSIS — J449 Chronic obstructive pulmonary disease, unspecified: Secondary | ICD-10-CM | POA: Diagnosis not present

## 2015-11-20 DIAGNOSIS — N915 Oligomenorrhea, unspecified: Secondary | ICD-10-CM | POA: Diagnosis not present

## 2015-11-20 DIAGNOSIS — R87611 Atypical squamous cells cannot exclude high grade squamous intraepithelial lesion on cytologic smear of cervix (ASC-H): Secondary | ICD-10-CM | POA: Diagnosis not present

## 2015-11-20 DIAGNOSIS — Z01411 Encounter for gynecological examination (general) (routine) with abnormal findings: Secondary | ICD-10-CM | POA: Diagnosis not present

## 2015-11-20 DIAGNOSIS — R87613 High grade squamous intraepithelial lesion on cytologic smear of cervix (HGSIL): Secondary | ICD-10-CM | POA: Diagnosis not present

## 2015-12-11 DIAGNOSIS — J449 Chronic obstructive pulmonary disease, unspecified: Secondary | ICD-10-CM | POA: Diagnosis not present

## 2016-01-10 DIAGNOSIS — J449 Chronic obstructive pulmonary disease, unspecified: Secondary | ICD-10-CM | POA: Diagnosis not present

## 2016-01-11 ENCOUNTER — Ambulatory Visit (HOSPITAL_COMMUNITY): Payer: Medicare HMO

## 2016-01-11 ENCOUNTER — Encounter (HOSPITAL_COMMUNITY): Payer: Self-pay | Admitting: Emergency Medicine

## 2016-01-11 ENCOUNTER — Ambulatory Visit (HOSPITAL_COMMUNITY)
Admission: EM | Admit: 2016-01-11 | Discharge: 2016-01-11 | Disposition: A | Discharge status: Home / Self Care | Payer: Medicare HMO | Attending: Emergency Medicine | Admitting: Emergency Medicine

## 2016-01-11 DIAGNOSIS — E282 Polycystic ovarian syndrome: Secondary | ICD-10-CM | POA: Diagnosis not present

## 2016-01-11 DIAGNOSIS — Z7984 Long term (current) use of oral hypoglycemic drugs: Secondary | ICD-10-CM | POA: Diagnosis not present

## 2016-01-11 DIAGNOSIS — S8992XA Unspecified injury of left lower leg, initial encounter: Secondary | ICD-10-CM | POA: Diagnosis not present

## 2016-01-11 DIAGNOSIS — F17210 Nicotine dependence, cigarettes, uncomplicated: Secondary | ICD-10-CM | POA: Insufficient documentation

## 2016-01-11 DIAGNOSIS — S8392XA Sprain of unspecified site of left knee, initial encounter: Secondary | ICD-10-CM | POA: Diagnosis not present

## 2016-01-11 DIAGNOSIS — J449 Chronic obstructive pulmonary disease, unspecified: Secondary | ICD-10-CM | POA: Insufficient documentation

## 2016-01-11 DIAGNOSIS — F319 Bipolar disorder, unspecified: Secondary | ICD-10-CM | POA: Diagnosis not present

## 2016-01-11 DIAGNOSIS — Z8249 Family history of ischemic heart disease and other diseases of the circulatory system: Secondary | ICD-10-CM | POA: Insufficient documentation

## 2016-01-11 DIAGNOSIS — R69 Illness, unspecified: Secondary | ICD-10-CM | POA: Diagnosis not present

## 2016-01-11 DIAGNOSIS — Z881 Allergy status to other antibiotic agents status: Secondary | ICD-10-CM | POA: Insufficient documentation

## 2016-01-11 DIAGNOSIS — I509 Heart failure, unspecified: Secondary | ICD-10-CM | POA: Diagnosis not present

## 2016-01-11 DIAGNOSIS — W1789XA Other fall from one level to another, initial encounter: Secondary | ICD-10-CM | POA: Insufficient documentation

## 2016-01-11 DIAGNOSIS — Z833 Family history of diabetes mellitus: Secondary | ICD-10-CM | POA: Diagnosis not present

## 2016-01-11 DIAGNOSIS — Z888 Allergy status to other drugs, medicaments and biological substances status: Secondary | ICD-10-CM | POA: Insufficient documentation

## 2016-01-11 DIAGNOSIS — S838X2A Sprain of other specified parts of left knee, initial encounter: Secondary | ICD-10-CM

## 2016-01-11 DIAGNOSIS — I110 Hypertensive heart disease with heart failure: Secondary | ICD-10-CM | POA: Diagnosis not present

## 2016-01-11 DIAGNOSIS — S76102A Unspecified injury of left quadriceps muscle, fascia and tendon, initial encounter: Secondary | ICD-10-CM

## 2016-01-11 DIAGNOSIS — Z79899 Other long term (current) drug therapy: Secondary | ICD-10-CM | POA: Diagnosis not present

## 2016-01-11 DIAGNOSIS — M25562 Pain in left knee: Secondary | ICD-10-CM | POA: Diagnosis not present

## 2016-01-11 MED ORDER — DICLOFENAC SODIUM 75 MG PO TBEC: 75.0000 mg | DELAYED_RELEASE_TABLET | Freq: Two times a day (BID) | ORAL | Status: DC

## 2016-01-11 MED ORDER — TRAMADOL HCL 50 MG PO TABS: ORAL_TABLET | ORAL | Status: DC

## 2016-01-11 NOTE — ED Provider Notes (Signed)
HPI  SUBJECTIVE:  Kaitlyn Chambers is a 35 y.o. female who presents with constant, dull, throbbing, left lateral knee pain after she jumped off of a 4 1/2-5 foot retaining wall 6 days ago onto the concrete.She states that she heard a pop, states that she felt something "pop out and, then went back in" and states that her knee feels unstable. She reports immediate knee swelling superior to the knee joint. She reports bruising along the medial quadriceps. She denies any giving way, sensation anything tearing. No redness, numbness, tingling, weakness. She does report some crepitus. She has been doing 600 mg ibuprofen, ice, elevation, Aleve 550 mg twice a day. Symptoms are better with elevation, worse with walking and palpation. She has a past medical history of morbid obesity, congestive heart failure, hypertension, COPD. No history of kidney disease. No previous history of injury to this knee. No history of diabetes. LMP: Now. PMD: None.    Past Medical History  Diagnosis Date  . Bipolar 1 disorder, manic, full remission ([place])   . Anxiety   . PCOS (polycystic ovarian syndrome)   . Diabetes mellitus     boarderline diabetic  . Metabolic syndrome     Past Surgical History  Procedure Laterality Date  . Carpectomy      Family History  Problem Relation Age of Onset  . Diabetes Mother   . Heart failure Mother     Social History  Substance Use Topics  . Smoking status: Current Every Day Smoker -- 0.50 packs/day for 20 years    Types: Cigarettes  . Smokeless tobacco: None  . Alcohol Use: No    No current facility-administered medications for this encounter.  Current outpatient prescriptions:  .  albuterol (PROVENTIL HFA;VENTOLIN HFA) 108 (90 BASE) MCG/ACT inhaler, Inhale 2 puffs into the lungs every 6 (six) hours as needed for wheezing or shortness of breath (wheezing). , Disp: , Rfl:  .  clonazePAM (KLONOPIN) 1 MG tablet, Take 1 mg by mouth daily as needed for anxiety., Disp: , Rfl:   .  diclofenac (VOLTAREN) 75 MG EC tablet, Take 1 tablet (75 mg total) by mouth 2 (two) times daily. Take with food, Disp: 30 tablet, Rfl: 0 .  fenofibrate (TRICOR) 145 MG tablet, Take 145 mg by mouth daily., Disp: , Rfl:  .  FLUoxetine (PROZAC) 20 MG capsule, Take 60 mg by mouth daily., Disp: , Rfl:  .  furosemide (LASIX) 40 MG tablet, Take 40 mg by mouth 2 (two) times daily. , Disp: , Rfl:  .  levothyroxine (SYNTHROID, LEVOTHROID) 25 MCG tablet, Take 25 mcg by mouth daily before breakfast., Disp: , Rfl:  .  magnesium 30 MG tablet, Take 30 mg by mouth 2 (two) times daily., Disp: , Rfl:  .  metFORMIN (GLUCOPHAGE) 1000 MG tablet, Take 1,000 mg by mouth 2 (two) times daily with a meal., Disp: , Rfl:  .  Multiple Vitamins-Minerals (MULTIVITAMIN WITH MINERALS) tablet, Take 1 tablet by mouth daily., Disp: , Rfl:  .  omeprazole (PRILOSEC) 20 MG capsule, Take 20 mg by mouth daily., Disp: , Rfl:  .  prazosin (MINIPRESS) 5 MG capsule, Take 5 mg by mouth at bedtime., Disp: , Rfl:  .  propranolol (INDERAL) 10 MG tablet, Take 10 mg by mouth 2 (two) times daily as needed (anxiety)., Disp: , Rfl:  .  QUEtiapine (SEROQUEL) 25 MG tablet, Take 25 mg by mouth at bedtime., Disp: , Rfl:  .  traMADol (ULTRAM) 50 MG tablet, 1-2 tabs po q  6 hr prn pain Maximum dose= 8 tablets per day, Disp: 20 tablet, Rfl: 0  Allergies  Allergen Reactions  . Levaquin [Levofloxacin]     Rash   . Metoprolol Nausea And Vomiting    Sweats, nausea, dizziness, vomitting     ROS  As noted in HPI.   Physical Exam  BP 141/79 mmHg  Pulse 88  Temp(Src) 98.8 F (37.1 C) (Oral)  Resp 16  SpO2 98%  LMP 01/11/2016  Constitutional: Well developed, well nourished, no acute distress Eyes:  EOMI, conjunctiva normal bilaterally HENT: Normocephalic, atraumatic,mucus membranes moist Respiratory: Normal inspiratory effort Cardiovascular: Normal rate GI: nondistended skin: No rash, skin intact Musculoskeletal: R Knee crepitus.  Positive tender swelling medial quadricep. No bruising. Positive tenderness medial knee joint. No tenderness along the lateral knee joint. Knee ROM baseline for PT, Flexion  intact ,  Patella NT, Patellar tendon NT, Lachman's stable, Varus LCL stress testing stable, Valgus MCL stress testing stable, McMurray's testing abnormal, distal NVI with intact baseline sensation / motor / pulse distal to knee affected extremity. No effusion. No erythema. No increased temperature. No popliteal tenderness. Neurologic: Alert & oriented x 3, no focal neuro deficits Psychiatric: Speech and behavior appropriate   ED Course   Medications - No data to display  Orders Placed This Encounter  Procedures  . DG Knee Complete 4 Views Left    Standing Status: Standing     Number of Occurrences: 1     Standing Expiration Date:     Order Specific Question:  Reason for Exam (SYMPTOM  OR DIAGNOSIS REQUIRED)    Answer:  medial joint tenderness r/o fx dislocation effusion  . Apply knee immobilizer    Standing Status: Standing     Number of Occurrences: 1     Standing Expiration Date:     Order Specific Question:  Laterality    Answer:  Left    Order Specific Question:  Knee Immobilizer Instruction    Answer:  At all times except when in CPM/PT    No results found for this or any previous visit (from the past 24 hour(s)). Dg Knee Complete 4 Views Left  01/11/2016  CLINICAL DATA:  Left knee pain after feeling and hearing a popping sensation in the knee while jumping down from a wall 1 week ago. EXAM: LEFT KNEE - COMPLETE 4+ VIEW COMPARISON:  None. FINDINGS: Mild to moderate lateral and patellofemoral spur formation. Mild tibial spine spur formation. No fracture, dislocation or effusion seen. IMPRESSION: 1. No fracture or dislocation. 2. Degenerative changes. Electronically Signed   By: Claudie Revering M.D.   On: 01/11/2016 19:15   Dg Knee Complete 4 Views Left  01/11/2016  CLINICAL DATA:  Left knee pain after feeling  and hearing a popping sensation in the knee while jumping down from a wall 1 week ago. EXAM: LEFT KNEE - COMPLETE 4+ VIEW COMPARISON:  None. FINDINGS: Mild to moderate lateral and patellofemoral spur formation. Mild tibial spine spur formation. No fracture, dislocation or effusion seen. IMPRESSION: 1. No fracture or dislocation. 2. Degenerative changes. Electronically Signed   By: Claudie Revering M.D.   On: 01/11/2016 19:15   ED Clinical Impression  Knee injury, left, initial encounter  Meniscal injury, left, initial encounter  Unsp injury of left quadriceps musc/fasc/tend, init   ED Assessment/Plan  Reviewed Imaging independently. No effusion, fracture, dislocation. See radiology report for full details.  Presentation most suggestive of a partial quadriceps tear and perhaps meniscal injury. She has no  weakness with flexion and extension. We'll place a knee immobilizer, advised her to get a cane. We'll also send home with diclofenac, tramadol, orthopedics or sports medicine follow-up. Discussed imaging, medical decision-making, signs and symptoms that should prompt return to the ER. Patient agrees with plan   *This clinic note was created using Dragon dictation software. Therefore, there may be occasional mistakes despite careful proofreading.  ?   Melynda Ripple, MD 01/11/16 1949

## 2016-01-11 NOTE — ED Notes (Signed)
Sent patient to xray at hospital.

## 2016-01-11 NOTE — ED Notes (Signed)
Left knee pain for one week.  Reports jumping off a wall landing with all her weight on left leg.  Since then, left knee has hurt.  Patient has used ice, motrin, aleve, epson salt.

## 2016-01-11 NOTE — Discharge Instructions (Signed)
You must follow-up with either the sports medicine clinic or with orthopedic surgery. You may need physical therapy. Start using a cane in addition to the knee immobilizer. Ice it for 20 minutes at a time. Stop all other NSAIDs. Start diclofenac. Tramadol as needed for severe pain.

## 2016-01-20 ENCOUNTER — Ambulatory Visit: Payer: Medicare HMO | Admitting: Sports Medicine

## 2016-02-10 DIAGNOSIS — J449 Chronic obstructive pulmonary disease, unspecified: Secondary | ICD-10-CM | POA: Diagnosis not present

## 2016-03-02 DIAGNOSIS — R69 Illness, unspecified: Secondary | ICD-10-CM | POA: Diagnosis not present

## 2016-03-02 DIAGNOSIS — G4730 Sleep apnea, unspecified: Secondary | ICD-10-CM | POA: Diagnosis not present

## 2016-03-02 DIAGNOSIS — J441 Chronic obstructive pulmonary disease with (acute) exacerbation: Secondary | ICD-10-CM | POA: Diagnosis not present

## 2016-03-02 DIAGNOSIS — E0869 Diabetes mellitus due to underlying condition with other specified complication: Secondary | ICD-10-CM | POA: Diagnosis not present

## 2016-03-02 DIAGNOSIS — E6609 Other obesity due to excess calories: Secondary | ICD-10-CM | POA: Diagnosis not present

## 2016-03-09 DIAGNOSIS — G4730 Sleep apnea, unspecified: Secondary | ICD-10-CM | POA: Diagnosis not present

## 2016-03-11 ENCOUNTER — Ambulatory Visit: Payer: Medicare HMO | Admitting: *Deleted

## 2016-03-12 DIAGNOSIS — J449 Chronic obstructive pulmonary disease, unspecified: Secondary | ICD-10-CM | POA: Diagnosis not present

## 2016-03-16 DIAGNOSIS — E038 Other specified hypothyroidism: Secondary | ICD-10-CM | POA: Diagnosis not present

## 2016-03-16 DIAGNOSIS — J441 Chronic obstructive pulmonary disease with (acute) exacerbation: Secondary | ICD-10-CM | POA: Diagnosis not present

## 2016-03-16 DIAGNOSIS — E6609 Other obesity due to excess calories: Secondary | ICD-10-CM | POA: Diagnosis not present

## 2016-03-16 DIAGNOSIS — G4730 Sleep apnea, unspecified: Secondary | ICD-10-CM | POA: Diagnosis not present

## 2016-04-11 DIAGNOSIS — J449 Chronic obstructive pulmonary disease, unspecified: Secondary | ICD-10-CM | POA: Diagnosis not present

## 2016-04-21 DIAGNOSIS — N870 Mild cervical dysplasia: Secondary | ICD-10-CM | POA: Diagnosis not present

## 2016-04-21 DIAGNOSIS — R87611 Atypical squamous cells cannot exclude high grade squamous intraepithelial lesion on cytologic smear of cervix (ASC-H): Secondary | ICD-10-CM | POA: Diagnosis not present

## 2016-04-21 DIAGNOSIS — Z3202 Encounter for pregnancy test, result negative: Secondary | ICD-10-CM | POA: Diagnosis not present

## 2016-04-27 DIAGNOSIS — G4730 Sleep apnea, unspecified: Secondary | ICD-10-CM | POA: Diagnosis not present

## 2016-04-27 DIAGNOSIS — E0869 Diabetes mellitus due to underlying condition with other specified complication: Secondary | ICD-10-CM | POA: Diagnosis not present

## 2016-04-27 DIAGNOSIS — J441 Chronic obstructive pulmonary disease with (acute) exacerbation: Secondary | ICD-10-CM | POA: Diagnosis not present

## 2016-04-27 DIAGNOSIS — E6609 Other obesity due to excess calories: Secondary | ICD-10-CM | POA: Diagnosis not present

## 2016-04-27 DIAGNOSIS — R69 Illness, unspecified: Secondary | ICD-10-CM | POA: Diagnosis not present

## 2016-05-04 HISTORY — PX: CERVICAL BIOPSY  W/ LOOP ELECTRODE EXCISION: SUR135

## 2016-05-12 ENCOUNTER — Encounter (HOSPITAL_COMMUNITY): Payer: Self-pay

## 2016-05-12 ENCOUNTER — Emergency Department (HOSPITAL_COMMUNITY): Payer: Medicare HMO

## 2016-05-12 ENCOUNTER — Emergency Department (HOSPITAL_COMMUNITY)
Admission: EM | Admit: 2016-05-12 | Discharge: 2016-05-12 | Disposition: A | Discharge status: Home / Self Care | Payer: Medicare HMO | Attending: Physician Assistant | Admitting: Physician Assistant

## 2016-05-12 DIAGNOSIS — Z7984 Long term (current) use of oral hypoglycemic drugs: Secondary | ICD-10-CM | POA: Insufficient documentation

## 2016-05-12 DIAGNOSIS — S40861A Insect bite (nonvenomous) of right upper arm, initial encounter: Secondary | ICD-10-CM | POA: Diagnosis not present

## 2016-05-12 DIAGNOSIS — R69 Illness, unspecified: Secondary | ICD-10-CM | POA: Diagnosis not present

## 2016-05-12 DIAGNOSIS — Z79899 Other long term (current) drug therapy: Secondary | ICD-10-CM | POA: Insufficient documentation

## 2016-05-12 DIAGNOSIS — Y999 Unspecified external cause status: Secondary | ICD-10-CM | POA: Insufficient documentation

## 2016-05-12 DIAGNOSIS — R0602 Shortness of breath: Secondary | ICD-10-CM | POA: Diagnosis not present

## 2016-05-12 DIAGNOSIS — Y929 Unspecified place or not applicable: Secondary | ICD-10-CM | POA: Insufficient documentation

## 2016-05-12 DIAGNOSIS — H65191 Other acute nonsuppurative otitis media, right ear: Secondary | ICD-10-CM

## 2016-05-12 DIAGNOSIS — S4991XA Unspecified injury of right shoulder and upper arm, initial encounter: Secondary | ICD-10-CM | POA: Diagnosis present

## 2016-05-12 DIAGNOSIS — F17210 Nicotine dependence, cigarettes, uncomplicated: Secondary | ICD-10-CM | POA: Diagnosis not present

## 2016-05-12 DIAGNOSIS — Y939 Activity, unspecified: Secondary | ICD-10-CM | POA: Diagnosis not present

## 2016-05-12 DIAGNOSIS — R05 Cough: Secondary | ICD-10-CM | POA: Diagnosis not present

## 2016-05-12 DIAGNOSIS — W57XXXA Bitten or stung by nonvenomous insect and other nonvenomous arthropods, initial encounter: Secondary | ICD-10-CM | POA: Insufficient documentation

## 2016-05-12 DIAGNOSIS — J449 Chronic obstructive pulmonary disease, unspecified: Secondary | ICD-10-CM | POA: Diagnosis not present

## 2016-05-12 MED ORDER — GUAIFENESIN-CODEINE 100-10 MG/5ML PO SOLN: 5.0000 mL | Freq: Four times a day (QID) | ORAL | 0 refills | Status: DC | PRN

## 2016-05-12 MED ORDER — AMOXICILLIN-POT CLAVULANATE 875-125 MG PO TABS
1.0000 | ORAL_TABLET | Freq: Once | ORAL | Status: AC
  Administered 2016-05-12: 1 via ORAL
  Filled 2016-05-12: qty 1

## 2016-05-12 MED ORDER — AMOXICILLIN-POT CLAVULANATE 875-125 MG PO TABS: 1.0000 | ORAL_TABLET | Freq: Two times a day (BID) | ORAL | 0 refills | Status: DC

## 2016-05-12 MED ORDER — BENZONATATE 100 MG PO CAPS: 100.0000 mg | ORAL_CAPSULE | Freq: Three times a day (TID) | ORAL | 0 refills | Status: DC | PRN

## 2016-05-12 NOTE — ED Provider Notes (Signed)
[facility] DEPT Provider Note   CSN: [id number] Arrival date & time: 05/12/16  0915     History   Chief Complaint Chief Complaint  Patient presents with  . Cough  . Otalgia    HPI Kaitlyn Chambers is a 35 y.o. female.  HPI  Patient's 35 year old with 4 day history of increasing cough congestion and ear pain. Patient's boyfriend had same thing earlier. Patient reports her biggest concern is her right ear pain. Patient COPD at baseline. Has noted cough at night worsened during the day. No fevers. No nausea vomiting or diarrhea.  Past Medical History:  Diagnosis Date  . Anxiety   . Bipolar 1 disorder, manic, full remission ([place])   . Diabetes mellitus    boarderline diabetic  . Metabolic syndrome   . PCOS (polycystic ovarian syndrome)     There are no active problems to display for this patient.   Past Surgical History:  Procedure Laterality Date  . CARPECTOMY      OB History    Gravida Para Term Preterm AB Living   1   0 0 1 0   SAB TAB Ectopic Multiple Live Births   1 0 0 0         Home Medications    Prior to Admission medications   Medication Sig Start Date End Date Taking? Authorizing Provider  albuterol (PROVENTIL HFA;VENTOLIN HFA) 108 (90 BASE) MCG/ACT inhaler Inhale 2 puffs into the lungs every 6 (six) hours as needed for wheezing or shortness of breath.    Yes Historical Provider, MD  BREO ELLIPTA 100-25 MCG/INH AEPB Inhale 1 puff into the lungs daily. 04/25/16  Yes Historical Provider, MD  Chlorpheniramine-APAP (CORICIDIN) 2-325 MG TABS Take 2 tablets by mouth every 4 (four) hours as needed (For cold symptoms.).   Yes Historical Provider, MD  clonazePAM (KLONOPIN) 1 MG tablet Take 1 mg by mouth daily as needed for anxiety.   Yes Historical Provider, MD  fenofibrate (TRICOR) 145 MG tablet Take 145 mg by mouth daily.   Yes Historical Provider, MD  FLUoxetine (PROZAC) 40 MG capsule Take 80 mg by mouth at bedtime. 04/17/16  Yes Historical Provider, MD   ibuprofen (ADVIL,MOTRIN) 200 MG tablet Take 400 mg by mouth every 6 (six) hours as needed for fever.   Yes Historical Provider, MD  levothyroxine (SYNTHROID, LEVOTHROID) 25 MCG tablet Take 25 mcg by mouth daily before breakfast.   Yes Historical Provider, MD  metFORMIN (GLUCOPHAGE) 1000 MG tablet Take 1,000 mg by mouth 2 (two) times daily with a meal.   Yes Historical Provider, MD  Multiple Vitamins-Minerals (MULTIVITAMIN WITH MINERALS) tablet Take 1 tablet by mouth daily.   Yes Historical Provider, MD  prazosin (MINIPRESS) 5 MG capsule Take 5 mg by mouth at bedtime.   Yes Historical Provider, MD  QUEtiapine (SEROQUEL) 25 MG tablet Take 25 mg by mouth at bedtime.   Yes Historical Provider, MD  valsartan (DIOVAN) 320 MG tablet Take 320 mg by mouth daily. 03/26/16  Yes Historical Provider, MD  VASCEPA 1 g CAPS Take 2 g by mouth 2 (two) times daily. 04/18/16  Yes Historical Provider, MD  diclofenac (VOLTAREN) 75 MG EC tablet Take 1 tablet (75 mg total) by mouth 2 (two) times daily. Take with food Patient not taking: Reported on 05/12/2016 01/11/16   Melynda Ripple, MD  traMADol (ULTRAM) 50 MG tablet 1-2 tabs po q 6 hr prn pain Maximum dose= 8 tablets per day Patient not taking: Reported on 05/12/2016 01/11/16  Melynda Ripple, MD    Family History Family History  Problem Relation Age of Onset  . Diabetes Mother   . Heart failure Mother     Social History Social History  Substance Use Topics  . Smoking status: Current Every Day Smoker    Packs/day: 0.50    Years: 20.00    Types: Cigarettes  . Smokeless tobacco: Never Used  . Alcohol use No     Allergies   Levaquin [levofloxacin] and Metoprolol   Review of Systems Review of Systems  HENT: Positive for congestion and ear pain. Negative for sore throat, trouble swallowing and voice change.   Respiratory: Positive for cough.   Cardiovascular: Negative for chest pain.  Musculoskeletal: Negative for arthralgias and back pain.    Hematological: Negative for adenopathy.  Psychiatric/Behavioral: Negative for confusion.     Physical Exam Updated Vital Signs BP 137/80 (BP Location: Left Arm)   Pulse 75   Temp 97.9 F (36.6 C) (Oral)   Resp 18   Ht 5\' 7"  (1.702 m)   Wt (!) 310 lb (140.6 kg)   LMP 04/28/2016   SpO2 99%   BMI 48.55 kg/m   Physical Exam  Constitutional: She is oriented to person, place, and time. She appears well-developed and well-nourished.  HENT:  Head: Normocephalic and atraumatic.  Right TM is dull appearing with white opacities  Mild lymphadenopathy.  Eyes: Right eye exhibits no discharge.  Cardiovascular: Normal rate, regular rhythm and normal heart sounds.   No murmur heard. Pulmonary/Chest: Effort normal and breath sounds normal. She has no wheezes. She has no rales.  Abdominal: Soft. She exhibits no distension. There is no tenderness.  Neurological: She is oriented to person, place, and time.  Skin: Skin is warm and dry. She is not diaphoretic.  Multiple bug bites on the right arm consistent with bedbug.  Psychiatric: She has a normal mood and affect.  Nursing note and vitals reviewed.    ED Treatments / Results  Labs (all labs ordered are listed, but only abnormal results are displayed) Labs Reviewed - No data to display  EKG  EKG Interpretation None       Radiology Dg Chest 2 View  Result Date: 05/12/2016 CLINICAL DATA:  Cough.  Shortness of breath. EXAM: CHEST  2 VIEW COMPARISON:  08/31/2015 .  07/02/2014. FINDINGS: Mediastinum and hilar structures normal. Heart size normal. No focal alveolar infiltrate. Stable mild bilateral interstitial prominence consistent chronic interstitial lung disease. No pleural effusion pneumothorax P IMPRESSION: 1. Stable bilateral interstitial prominence most consistent with chronic interstitial lung disease. 2. Low lung volumes.  No focal alveolar infiltrate. Electronically Signed   By: Marcello Moores  Register   On: 05/12/2016 09:54     Procedures Procedures (including critical care time)  Medications Ordered in ED Medications - No data to display   Initial Impression / Assessment and Plan / ED Course  I have reviewed the triage vital signs and the nursing notes.  Pertinent labs & imaging results that were available during my care of the patient were reviewed by me and considered in my medical decision making (see chart for details).  Clinical Course     Patient is a flat pleasant 35 year old female presenting with 3-4 days of cough congestion and right ear pain. I believe the right ear is infected we will treat with antibiotic's. I told hershe needs a follow-up with a primary care physician to have her ear rechecked in one week. Patient has had bugs from recent hotel  stay. No overlying infection.  Final Clinical Impressions(s) / ED Diagnoses   Final diagnoses:  None    New Prescriptions New Prescriptions   No medications on file     Courteney Julio Alm, MD 05/12/16 1022

## 2016-05-12 NOTE — ED Triage Notes (Signed)
Pt with earache and cough x 3 days.  Motrin with no relief.  ? fever

## 2016-05-12 NOTE — Discharge Instructions (Signed)
You have what appears to be sinusitis versus near infection. Please take antibiotics and return to follow up with PCP to be rechecked. Please return with any concerns.

## 2016-05-24 DIAGNOSIS — N870 Mild cervical dysplasia: Secondary | ICD-10-CM | POA: Diagnosis not present

## 2016-06-11 DIAGNOSIS — J449 Chronic obstructive pulmonary disease, unspecified: Secondary | ICD-10-CM | POA: Diagnosis not present

## 2016-06-15 DIAGNOSIS — Z3202 Encounter for pregnancy test, result negative: Secondary | ICD-10-CM | POA: Diagnosis not present

## 2016-06-15 DIAGNOSIS — N915 Oligomenorrhea, unspecified: Secondary | ICD-10-CM | POA: Diagnosis not present

## 2016-06-15 DIAGNOSIS — N870 Mild cervical dysplasia: Secondary | ICD-10-CM | POA: Diagnosis not present

## 2016-07-01 ENCOUNTER — Inpatient Hospital Stay (HOSPITAL_COMMUNITY)
Admission: EM | Admit: 2016-07-01 | Discharge: 2016-07-03 | DRG: 247 | Disposition: A | Discharge status: Home / Self Care | Payer: Medicare HMO | Attending: Internal Medicine | Admitting: Internal Medicine

## 2016-07-01 ENCOUNTER — Encounter (HOSPITAL_COMMUNITY)
Admission: EM | Disposition: A | Discharge status: Home / Self Care | Payer: Self-pay | Source: Home / Self Care | Attending: Internal Medicine

## 2016-07-01 ENCOUNTER — Encounter (HOSPITAL_COMMUNITY): Payer: Self-pay | Admitting: Emergency Medicine

## 2016-07-01 ENCOUNTER — Emergency Department (HOSPITAL_COMMUNITY): Payer: Medicare HMO

## 2016-07-01 DIAGNOSIS — I214 Non-ST elevation (NSTEMI) myocardial infarction: Secondary | ICD-10-CM | POA: Diagnosis not present

## 2016-07-01 DIAGNOSIS — F419 Anxiety disorder, unspecified: Secondary | ICD-10-CM | POA: Diagnosis present

## 2016-07-01 DIAGNOSIS — Z8249 Family history of ischemic heart disease and other diseases of the circulatory system: Secondary | ICD-10-CM

## 2016-07-01 DIAGNOSIS — E785 Hyperlipidemia, unspecified: Secondary | ICD-10-CM | POA: Diagnosis present

## 2016-07-01 DIAGNOSIS — I959 Hypotension, unspecified: Secondary | ICD-10-CM | POA: Diagnosis not present

## 2016-07-01 DIAGNOSIS — Z888 Allergy status to other drugs, medicaments and biological substances status: Secondary | ICD-10-CM

## 2016-07-01 DIAGNOSIS — Z79899 Other long term (current) drug therapy: Secondary | ICD-10-CM | POA: Diagnosis not present

## 2016-07-01 DIAGNOSIS — I2510 Atherosclerotic heart disease of native coronary artery without angina pectoris: Secondary | ICD-10-CM | POA: Diagnosis not present

## 2016-07-01 DIAGNOSIS — R079 Chest pain, unspecified: Secondary | ICD-10-CM | POA: Diagnosis not present

## 2016-07-01 DIAGNOSIS — Z720 Tobacco use: Secondary | ICD-10-CM

## 2016-07-01 DIAGNOSIS — F1210 Cannabis abuse, uncomplicated: Secondary | ICD-10-CM | POA: Diagnosis present

## 2016-07-01 DIAGNOSIS — Z881 Allergy status to other antibiotic agents status: Secondary | ICD-10-CM | POA: Diagnosis not present

## 2016-07-01 DIAGNOSIS — F17210 Nicotine dependence, cigarettes, uncomplicated: Secondary | ICD-10-CM | POA: Diagnosis present

## 2016-07-01 DIAGNOSIS — Z6841 Body Mass Index (BMI) 40.0 and over, adult: Secondary | ICD-10-CM

## 2016-07-01 DIAGNOSIS — E039 Hypothyroidism, unspecified: Secondary | ICD-10-CM | POA: Diagnosis present

## 2016-07-01 DIAGNOSIS — E119 Type 2 diabetes mellitus without complications: Secondary | ICD-10-CM

## 2016-07-01 DIAGNOSIS — Z7984 Long term (current) use of oral hypoglycemic drugs: Secondary | ICD-10-CM

## 2016-07-01 DIAGNOSIS — I10 Essential (primary) hypertension: Secondary | ICD-10-CM

## 2016-07-01 DIAGNOSIS — Z23 Encounter for immunization: Secondary | ICD-10-CM | POA: Diagnosis not present

## 2016-07-01 DIAGNOSIS — F319 Bipolar disorder, unspecified: Secondary | ICD-10-CM | POA: Diagnosis present

## 2016-07-01 DIAGNOSIS — E1159 Type 2 diabetes mellitus with other circulatory complications: Secondary | ICD-10-CM

## 2016-07-01 DIAGNOSIS — E282 Polycystic ovarian syndrome: Secondary | ICD-10-CM

## 2016-07-01 DIAGNOSIS — R69 Illness, unspecified: Secondary | ICD-10-CM | POA: Diagnosis not present

## 2016-07-01 HISTORY — DX: Depression, unspecified: F32.A

## 2016-07-01 HISTORY — DX: Gastric ulcer, unspecified as acute or chronic, without hemorrhage or perforation: K25.9

## 2016-07-01 HISTORY — DX: Hypothyroidism, unspecified: E03.9

## 2016-07-01 HISTORY — DX: Dependence on supplemental oxygen: Z99.81

## 2016-07-01 HISTORY — DX: Non-ST elevation (NSTEMI) myocardial infarction: I21.4

## 2016-07-01 HISTORY — DX: Atherosclerotic heart disease of native coronary artery without angina pectoris: I25.10

## 2016-07-01 HISTORY — DX: Chronic obstructive pulmonary disease, unspecified: J44.9

## 2016-07-01 HISTORY — DX: Pure hypercholesterolemia, unspecified: E78.00

## 2016-07-01 HISTORY — PX: CORONARY ANGIOPLASTY WITH STENT PLACEMENT: SHX49

## 2016-07-01 HISTORY — PX: CARDIAC CATHETERIZATION: SHX172

## 2016-07-01 HISTORY — DX: Heart failure, unspecified: I50.9

## 2016-07-01 HISTORY — DX: Essential (primary) hypertension: I10

## 2016-07-01 HISTORY — DX: Gastro-esophageal reflux disease without esophagitis: K21.9

## 2016-07-01 HISTORY — DX: Type 2 diabetes mellitus without complications: E11.9

## 2016-07-01 HISTORY — DX: Major depressive disorder, single episode, unspecified: F32.9

## 2016-07-01 HISTORY — DX: Gout, unspecified: M10.9

## 2016-07-01 LAB — CBC
HEMATOCRIT: 40.1 % (ref 36.0–46.0)
Hemoglobin: 14.2 g/dL (ref 12.0–15.0)
MCH: 31.4 pg (ref 26.0–34.0)
MCHC: 35.4 g/dL (ref 30.0–36.0)
MCV: 88.7 fL (ref 78.0–100.0)
Platelets: 243 10*3/uL (ref 150–400)
RBC: 4.52 MIL/uL (ref 3.87–5.11)
RDW: 12.8 % (ref 11.5–15.5)
WBC: 11.9 10*3/uL — AB (ref 4.0–10.5)

## 2016-07-01 LAB — HEMOGLOBIN A1C
HEMOGLOBIN A1C: 6.7 % — AB (ref 4.8–5.6)
MEAN PLASMA GLUCOSE: 146 mg/dL

## 2016-07-01 LAB — CBC WITH DIFFERENTIAL/PLATELET
BASOS ABS: 0 10*3/uL (ref 0.0–0.1)
BASOS PCT: 0 %
EOS ABS: 0.1 10*3/uL (ref 0.0–0.7)
EOS PCT: 1 %
HCT: 40.5 % (ref 36.0–46.0)
Hemoglobin: 14.1 g/dL (ref 12.0–15.0)
Lymphocytes Relative: 31 %
Lymphs Abs: 3.8 10*3/uL (ref 0.7–4.0)
MCH: 31.1 pg (ref 26.0–34.0)
MCHC: 34.8 g/dL (ref 30.0–36.0)
MCV: 89.4 fL (ref 78.0–100.0)
MONO ABS: 0.6 10*3/uL (ref 0.1–1.0)
MONOS PCT: 5 %
Neutro Abs: 7.6 10*3/uL (ref 1.7–7.7)
Neutrophils Relative %: 63 %
PLATELETS: 245 10*3/uL (ref 150–400)
RBC: 4.53 MIL/uL (ref 3.87–5.11)
RDW: 12.8 % (ref 11.5–15.5)
WBC: 12 10*3/uL — ABNORMAL HIGH (ref 4.0–10.5)

## 2016-07-01 LAB — I-STAT TROPONIN, ED
TROPONIN I, POC: 0.09 ng/mL — AB (ref 0.00–0.08)
Troponin i, poc: 0.01 ng/mL (ref 0.00–0.08)

## 2016-07-01 LAB — BASIC METABOLIC PANEL
ANION GAP: 9 (ref 5–15)
BUN: 13 mg/dL (ref 6–20)
CHLORIDE: 103 mmol/L (ref 101–111)
CO2: 27 mmol/L (ref 22–32)
Calcium: 9.1 mg/dL (ref 8.9–10.3)
Creatinine, Ser: 0.96 mg/dL (ref 0.44–1.00)
GFR calc Af Amer: >60 mL/min (ref >60)
GFR calc non Af Amer: >60 mL/min (ref >60)
Glucose, Bld: 169 mg/dL — ABNORMAL HIGH (ref 65–99)
POTASSIUM: 3.9 mmol/L (ref 3.5–5.1)
Sodium: 139 mmol/L (ref 135–145)

## 2016-07-01 LAB — CREATININE, SERUM: Creatinine, Ser: 0.84 mg/dL (ref 0.44–1.00)

## 2016-07-01 LAB — I-STAT BETA HCG BLOOD, ED (MC, WL, AP ONLY)

## 2016-07-01 LAB — POCT ACTIVATED CLOTTING TIME
ACTIVATED CLOTTING TIME: 246 s
ACTIVATED CLOTTING TIME: 263 s
ACTIVATED CLOTTING TIME: 263 s
Activated Clotting Time: >1000 seconds

## 2016-07-01 LAB — CBG MONITORING, ED: GLUCOSE-CAPILLARY: 172 mg/dL — AB (ref 65–99)

## 2016-07-01 LAB — TROPONIN I
TROPONIN I: 0.05 ng/mL — AB (ref <0.03)
Troponin I: 0.29 ng/mL (ref <0.03)
Troponin I: 2.49 ng/mL (ref <0.03)
Troponin I: 15.91 ng/mL (ref <0.03)

## 2016-07-01 LAB — PROTIME-INR
INR: 1.01
PROTHROMBIN TIME: 13.4 s (ref 11.4–15.2)

## 2016-07-01 SURGERY — LEFT HEART CATH AND CORONARY ANGIOGRAPHY
Anesthesia: LOCAL

## 2016-07-01 MED ORDER — HEPARIN (PORCINE) IN NACL 2-0.9 UNIT/ML-% IJ SOLN
INTRAMUSCULAR | Status: AC
  Filled 2016-07-01: qty 1000

## 2016-07-01 MED ORDER — CLONAZEPAM 0.5 MG PO TABS
1.0000 mg | ORAL_TABLET | Freq: Every day | ORAL | Status: DC | PRN
  Administered 2016-07-01 – 2016-07-02 (×2): 1 mg via ORAL
  Filled 2016-07-01 (×2): qty 2

## 2016-07-01 MED ORDER — LIDOCAINE HCL (PF) 1 % IJ SOLN
INTRAMUSCULAR | Status: AC
  Filled 2016-07-01: qty 30

## 2016-07-01 MED ORDER — ONDANSETRON HCL 4 MG PO TABS
4.0000 mg | ORAL_TABLET | Freq: Four times a day (QID) | ORAL | Status: DC | PRN
  Filled 2016-07-01: qty 1

## 2016-07-01 MED ORDER — FENTANYL CITRATE (PF) 100 MCG/2ML IJ SOLN
INTRAMUSCULAR | Status: AC
  Filled 2016-07-01: qty 2

## 2016-07-01 MED ORDER — HEPARIN SODIUM (PORCINE) 1000 UNIT/ML IJ SOLN
INTRAMUSCULAR | Status: AC
  Filled 2016-07-01: qty 1

## 2016-07-01 MED ORDER — MIDAZOLAM HCL 2 MG/2ML IJ SOLN
INTRAMUSCULAR | Status: DC | PRN
Start: 2016-07-01 — End: 2016-07-01
  Administered 2016-07-01: 2 mg via INTRAVENOUS

## 2016-07-01 MED ORDER — NITROGLYCERIN 0.4 MG/SPRAY TL SOLN
1.0000 | Status: DC | PRN
  Filled 2016-07-01: qty 4.9

## 2016-07-01 MED ORDER — PRAZOSIN HCL 5 MG PO CAPS
5.0000 mg | ORAL_CAPSULE | Freq: Every day | ORAL | Status: DC
  Administered 2016-07-01: 22:00:00 5 mg via ORAL
  Filled 2016-07-01 (×2): qty 1

## 2016-07-01 MED ORDER — HEPARIN (PORCINE) IN NACL 2-0.9 UNIT/ML-% IJ SOLN
INTRAMUSCULAR | Status: DC | PRN
  Administered 2016-07-01: 1500 mL via INTRA_ARTERIAL

## 2016-07-01 MED ORDER — ATORVASTATIN CALCIUM 80 MG PO TABS
80.0000 mg | ORAL_TABLET | Freq: Every day | ORAL | Status: DC
  Administered 2016-07-01 – 2016-07-02 (×2): 80 mg via ORAL
  Filled 2016-07-01 (×2): qty 1

## 2016-07-01 MED ORDER — BISACODYL 5 MG PO TBEC
5.0000 mg | DELAYED_RELEASE_TABLET | Freq: Every day | ORAL | Status: DC | PRN
  Filled 2016-07-01: qty 1

## 2016-07-01 MED ORDER — BIVALIRUDIN 250 MG IV SOLR
INTRAVENOUS | Status: AC
  Filled 2016-07-01: qty 250

## 2016-07-01 MED ORDER — SODIUM CHLORIDE 0.9% FLUSH: 3.0000 mL | INTRAVENOUS | Status: DC | PRN

## 2016-07-01 MED ORDER — HYDRALAZINE HCL 20 MG/ML IJ SOLN: 5.0000 mg | INTRAMUSCULAR | Status: DC | PRN

## 2016-07-01 MED ORDER — FENOFIBRATE 160 MG PO TABS
160.0000 mg | ORAL_TABLET | Freq: Every day | ORAL | Status: DC
  Administered 2016-07-01 – 2016-07-03 (×3): 160 mg via ORAL
  Filled 2016-07-01 (×3): qty 1

## 2016-07-01 MED ORDER — NITROGLYCERIN IN D5W 200-5 MCG/ML-% IV SOLN
2.0000 ug/min | INTRAVENOUS | Status: DC
  Administered 2016-07-01: 5 ug/min via INTRAVENOUS
  Filled 2016-07-01: qty 250

## 2016-07-01 MED ORDER — OMEGA-3-ACID ETHYL ESTERS 1 G PO CAPS
2.0000 g | ORAL_CAPSULE | Freq: Two times a day (BID) | ORAL | Status: DC
  Administered 2016-07-01 – 2016-07-03 (×4): 2 g via ORAL
  Filled 2016-07-01 (×4): qty 2

## 2016-07-01 MED ORDER — HEPARIN SODIUM (PORCINE) 1000 UNIT/ML IJ SOLN
INTRAMUSCULAR | Status: DC | PRN
  Administered 2016-07-01: 4000 [IU] via INTRAVENOUS
  Administered 2016-07-01 (×2): 6000 [IU] via INTRAVENOUS

## 2016-07-01 MED ORDER — TRAZODONE HCL 50 MG PO TABS: 25.0000 mg | ORAL_TABLET | Freq: Every evening | ORAL | Status: DC | PRN

## 2016-07-01 MED ORDER — ASPIRIN 81 MG PO CHEW
324.0000 mg | CHEWABLE_TABLET | Freq: Once | ORAL | Status: AC
  Administered 2016-07-01: 324 mg via ORAL
  Filled 2016-07-01: qty 4

## 2016-07-01 MED ORDER — TICAGRELOR 90 MG PO TABS
90.0000 mg | ORAL_TABLET | Freq: Two times a day (BID) | ORAL | Status: DC
  Administered 2016-07-02 – 2016-07-03 (×3): 90 mg via ORAL
  Filled 2016-07-01 (×3): qty 1

## 2016-07-01 MED ORDER — ALBUTEROL SULFATE (2.5 MG/3ML) 0.083% IN NEBU: 2.5000 mg | INHALATION_SOLUTION | Freq: Four times a day (QID) | RESPIRATORY_TRACT | Status: DC | PRN

## 2016-07-01 MED ORDER — FENTANYL CITRATE (PF) 100 MCG/2ML IJ SOLN
INTRAMUSCULAR | Status: DC | PRN
  Administered 2016-07-01 (×2): 50 ug via INTRAVENOUS

## 2016-07-01 MED ORDER — ACETAMINOPHEN 500 MG PO TABS: 1000.0000 mg | ORAL_TABLET | Freq: Once | ORAL | Status: DC

## 2016-07-01 MED ORDER — VERAPAMIL HCL 2.5 MG/ML IV SOLN
INTRAVENOUS | Status: AC
  Filled 2016-07-01: qty 2

## 2016-07-01 MED ORDER — SODIUM CHLORIDE 0.9% FLUSH
3.0000 mL | Freq: Two times a day (BID) | INTRAVENOUS | Status: DC
  Administered 2016-07-01: 22:00:00 3 mL via INTRAVENOUS

## 2016-07-01 MED ORDER — IBUPROFEN 800 MG PO TABS
800.0000 mg | ORAL_TABLET | Freq: Once | ORAL | Status: AC
  Administered 2016-07-01: 800 mg via ORAL
  Filled 2016-07-01: qty 1

## 2016-07-01 MED ORDER — POLYETHYLENE GLYCOL 3350 17 G PO PACK: 17.0000 g | PACK | Freq: Every day | ORAL | Status: DC | PRN

## 2016-07-01 MED ORDER — PNEUMOCOCCAL VAC POLYVALENT 25 MCG/0.5ML IJ INJ
0.5000 mL | INJECTION | INTRAMUSCULAR | Status: AC
  Administered 2016-07-02: 10:00:00 0.5 mL via INTRAMUSCULAR
  Filled 2016-07-01: qty 0.5

## 2016-07-01 MED ORDER — SODIUM CHLORIDE 0.9 % IV SOLN
INTRAVENOUS | Status: DC | PRN
  Administered 2016-07-01: 1.75 mg/kg/h via INTRAVENOUS

## 2016-07-01 MED ORDER — MIDAZOLAM HCL 2 MG/2ML IJ SOLN
INTRAMUSCULAR | Status: AC
  Filled 2016-07-01: qty 2

## 2016-07-01 MED ORDER — ONDANSETRON 4 MG PO TBDP
4.0000 mg | ORAL_TABLET | Freq: Once | ORAL | Status: AC
  Administered 2016-07-01: 4 mg via ORAL
  Filled 2016-07-01: qty 1

## 2016-07-01 MED ORDER — BIVALIRUDIN BOLUS VIA INFUSION - CUPID
INTRAVENOUS | Status: DC | PRN
  Administered 2016-07-01: 108.9 mg via INTRAVENOUS

## 2016-07-01 MED ORDER — ONDANSETRON HCL 4 MG/2ML IJ SOLN: 4.0000 mg | Freq: Four times a day (QID) | INTRAMUSCULAR | Status: DC | PRN

## 2016-07-01 MED ORDER — ACETAMINOPHEN 325 MG PO TABS: 650.0000 mg | ORAL_TABLET | Freq: Four times a day (QID) | ORAL | Status: DC | PRN

## 2016-07-01 MED ORDER — DIAZEPAM 5 MG PO TABS: 5.0000 mg | ORAL_TABLET | ORAL | Status: DC

## 2016-07-01 MED ORDER — FLUTICASONE FUROATE-VILANTEROL 100-25 MCG/INH IN AEPB: 1.0000 | INHALATION_SPRAY | Freq: Every day | RESPIRATORY_TRACT | Status: DC

## 2016-07-01 MED ORDER — TICAGRELOR 90 MG PO TABS
ORAL_TABLET | ORAL | Status: AC
  Filled 2016-07-01: qty 2

## 2016-07-01 MED ORDER — NITROGLYCERIN 1 MG/10 ML FOR IR/CATH LAB
INTRA_ARTERIAL | Status: DC | PRN
Start: 2016-07-01 — End: 2016-07-01
  Administered 2016-07-01: 200 ug via INTRACORONARY

## 2016-07-01 MED ORDER — MORPHINE SULFATE (PF) 4 MG/ML IV SOLN: 2.0000 mg | INTRAVENOUS | Status: DC | PRN

## 2016-07-01 MED ORDER — ACETAMINOPHEN 650 MG RE SUPP: 650.0000 mg | Freq: Four times a day (QID) | RECTAL | Status: DC | PRN

## 2016-07-01 MED ORDER — HYDRALAZINE HCL 20 MG/ML IJ SOLN: 10.0000 mg | Freq: Four times a day (QID) | INTRAMUSCULAR | Status: DC | PRN

## 2016-07-01 MED ORDER — IOPAMIDOL (ISOVUE-370) INJECTION 76%
INTRAVENOUS | Status: AC
  Filled 2016-07-01: qty 100

## 2016-07-01 MED ORDER — ANGIOPLASTY BOOK
Freq: Once | Status: AC
  Administered 2016-07-01: 20:00:00
  Filled 2016-07-01: qty 1

## 2016-07-01 MED ORDER — ASPIRIN EC 325 MG PO TBEC
325.0000 mg | DELAYED_RELEASE_TABLET | Freq: Every day | ORAL | Status: DC
  Administered 2016-07-01: 325 mg via ORAL
  Filled 2016-07-01: qty 1

## 2016-07-01 MED ORDER — GI COCKTAIL ~~LOC~~: 30.0000 mL | Freq: Four times a day (QID) | ORAL | Status: DC | PRN

## 2016-07-01 MED ORDER — IRBESARTAN 300 MG PO TABS
300.0000 mg | ORAL_TABLET | Freq: Every day | ORAL | Status: DC
  Administered 2016-07-01: 300 mg via ORAL
  Filled 2016-07-01: qty 1

## 2016-07-01 MED ORDER — SODIUM CHLORIDE 0.9 % IV SOLN: 250.0000 mL | INTRAVENOUS | Status: DC | PRN

## 2016-07-01 MED ORDER — LABETALOL HCL 5 MG/ML IV SOLN: 10.0000 mg | INTRAVENOUS | Status: DC | PRN

## 2016-07-01 MED ORDER — HEART ATTACK BOUNCING BOOK
Freq: Once | Status: AC
  Administered 2016-07-01: 20:00:00
  Filled 2016-07-01: qty 1

## 2016-07-01 MED ORDER — SODIUM CHLORIDE 0.9 % IV SOLN
INTRAVENOUS | Status: DC
  Administered 2016-07-01: 20:00:00 via INTRAVENOUS

## 2016-07-01 MED ORDER — FLUOXETINE HCL 20 MG PO CAPS
80.0000 mg | ORAL_CAPSULE | Freq: Every day | ORAL | Status: DC
  Administered 2016-07-01 – 2016-07-02 (×2): 80 mg via ORAL
  Filled 2016-07-01 (×2): qty 4

## 2016-07-01 MED ORDER — FLUTICASONE FUROATE-VILANTEROL 100-25 MCG/INH IN AEPB
1.0000 | INHALATION_SPRAY | Freq: Every day | RESPIRATORY_TRACT | Status: DC
  Administered 2016-07-01: 20:00:00 1 via RESPIRATORY_TRACT
  Filled 2016-07-01: qty 28

## 2016-07-01 MED ORDER — TICAGRELOR 90 MG PO TABS
ORAL_TABLET | ORAL | Status: DC | PRN
  Administered 2016-07-01: 180 mg via ORAL

## 2016-07-01 MED ORDER — ENOXAPARIN SODIUM 40 MG/0.4ML ~~LOC~~ SOLN
40.0000 mg | SUBCUTANEOUS | Status: DC
  Filled 2016-07-01: qty 0.4

## 2016-07-01 MED ORDER — VERAPAMIL HCL 2.5 MG/ML IV SOLN
INTRAVENOUS | Status: DC | PRN
  Administered 2016-07-01: 10 mL via INTRA_ARTERIAL

## 2016-07-01 MED ORDER — LIDOCAINE HCL (PF) 1 % IJ SOLN
INTRAMUSCULAR | Status: DC | PRN
Start: 2016-07-01 — End: 2016-07-01
  Administered 2016-07-01: 2 mL via INTRADERMAL

## 2016-07-01 MED ORDER — ALBUTEROL SULFATE HFA 108 (90 BASE) MCG/ACT IN AERS: 2.0000 | INHALATION_SPRAY | Freq: Four times a day (QID) | RESPIRATORY_TRACT | Status: DC | PRN

## 2016-07-01 MED ORDER — ONDANSETRON HCL 4 MG/2ML IJ SOLN
4.0000 mg | Freq: Four times a day (QID) | INTRAMUSCULAR | Status: DC
  Administered 2016-07-01: 4 mg via INTRAVENOUS
  Filled 2016-07-01: qty 2

## 2016-07-01 MED ORDER — NITROGLYCERIN 1 MG/10 ML FOR IR/CATH LAB
INTRA_ARTERIAL | Status: AC
  Filled 2016-07-01: qty 10

## 2016-07-01 MED ORDER — IOPAMIDOL (ISOVUE-370) INJECTION 76%
INTRAVENOUS | Status: DC | PRN
  Administered 2016-07-01: 180 mL via INTRA_ARTERIAL

## 2016-07-01 MED ORDER — AMLODIPINE BESYLATE 5 MG PO TABS
5.0000 mg | ORAL_TABLET | Freq: Every day | ORAL | Status: DC
Start: 2016-07-01 — End: 2016-07-02
  Administered 2016-07-01: 5 mg via ORAL
  Filled 2016-07-01: qty 1

## 2016-07-01 MED ORDER — LEVOTHYROXINE SODIUM 25 MCG PO TABS
25.0000 ug | ORAL_TABLET | Freq: Every day | ORAL | Status: DC
  Administered 2016-07-01 – 2016-07-03 (×3): 25 ug via ORAL
  Filled 2016-07-01 (×3): qty 1

## 2016-07-01 MED ORDER — NICOTINE 14 MG/24HR TD PT24
14.0000 mg | MEDICATED_PATCH | Freq: Every day | TRANSDERMAL | Status: DC
  Administered 2016-07-01 – 2016-07-03 (×3): 14 mg via TRANSDERMAL
  Filled 2016-07-01 (×3): qty 1

## 2016-07-01 MED ORDER — QUETIAPINE FUMARATE 25 MG PO TABS
25.0000 mg | ORAL_TABLET | Freq: Every day | ORAL | Status: DC
  Administered 2016-07-01 – 2016-07-02 (×2): 25 mg via ORAL
  Filled 2016-07-01 (×2): qty 1

## 2016-07-01 SURGICAL SUPPLY — 22 items
BALLN EMERGE MR 2.5X15 (BALLOONS) ×2
BALLN ~~LOC~~ EUPHORA RX 3.0X15 (BALLOONS) ×2
BALLOON EMERGE MR 2.5X15 (BALLOONS) IMPLANT
BALLOON ~~LOC~~ EUPHORA RX 3.0X15 (BALLOONS) IMPLANT
CATH INFINITI 5 FR JL3.5 (CATHETERS) ×1 IMPLANT
CATH INFINITI 5FR ANG PIGTAIL (CATHETERS) ×1 IMPLANT
CATH INFINITI JR4 5F (CATHETERS) ×1 IMPLANT
CATH VISTA GUIDE 6FR XBLAD3.5 (CATHETERS) ×1 IMPLANT
DEVICE RAD COMP TR BAND LRG (VASCULAR PRODUCTS) ×1 IMPLANT
ELECT DEFIB PAD ADLT CADENCE (PAD) ×1 IMPLANT
GLIDESHEATH SLEND SS 6F .021 (SHEATH) ×1 IMPLANT
GUIDEWIRE INQWIRE 1.5J.035X260 (WIRE) IMPLANT
HOVERMATT SINGLE USE (MISCELLANEOUS) ×1 IMPLANT
INQWIRE 1.5J .035X260CM (WIRE) ×2
KIT ENCORE 26 ADVANTAGE (KITS) ×1 IMPLANT
KIT HEART LEFT (KITS) ×2 IMPLANT
PACK CARDIAC CATHETERIZATION (CUSTOM PROCEDURE TRAY) ×2 IMPLANT
STENT SYNERGY DES 3X20 (Permanent Stent) ×1 IMPLANT
SYR MEDRAD MARK V 150ML (SYRINGE) ×2 IMPLANT
TRANSDUCER W/STOPCOCK (MISCELLANEOUS) ×3 IMPLANT
TUBING CIL FLEX 10 FLL-RA (TUBING) ×2 IMPLANT
WIRE COUGAR XT STRL 190CM (WIRE) ×2 IMPLANT

## 2016-07-01 NOTE — H&P (Signed)
History and Physical    Kaitlyn Chambers [id number] DOB: Feb 08, 1981 DOA: 07/01/2016  PCP: Elyn Peers, MD Patient coming from: Home  Chief Complaint: chest pain  HPI: Kaitlyn Chambers is a 35 y.o. female with medical history significant of PCOS on Metformin (patient denied personal history of diabetes mellitus and insisted on metformin prescribed by her PCP for polycystic ovarian syndrome), bipolar disorder. HTN, ongoing tobacco abuse for many years, morbid obesity who presented to the ED with c/o chest pain. Patient reported that first onset of pain was around 8:00 last night when she was sitting in a chair. She took sublingual nitroglycerin that relieved the pain and went to bed, but was awaken around 1 AM by another attack of chest pain. She took another nitroglycerin but this time and pain persisted and patient called EMS and brought her to the emergency department. She describes pain as severe and squeezing radiating, radiating to the neck and left arm. He experienced shortness of breath, diaphoresis, nausea and vomiting She has strong family history of CAD and significant risk factors   ED Course: Upon arrival to the emergency department after blood pressure was accelerated to 163/111 mmHg, initial i-STAT troponin was 0.01, but then it increased to 0.09. However, serum troponin 20 minutes later was 0.05. Chest x-ray didn't show any acute cardiopulmonary findings. EKG showed normal sinus rhythm, occasional PVCs, no acute T-wave changes   Review of Systems: As per HPI otherwise 10 point review of systems negative.   Ambulatory Status: independent  Past Medical History:  Diagnosis Date  . Anxiety   . Bipolar 1 disorder, manic, full remission ([place])   . Diabetes mellitus    boarderline diabetic  . Metabolic syndrome   . PCOS (polycystic ovarian syndrome)     Past Surgical History:  Procedure Laterality Date  . CARPECTOMY      Social History   Social History  . Marital  status: Divorced    Spouse name: Kaitlyn Chambers  . Number of children: Kaitlyn Chambers  . Years of education: Kaitlyn Chambers   Occupational History  . Not on file.   Social History Main Topics  . Smoking status: Current Every Day Smoker    Packs/day: 0.50    Years: 20.00    Types: Cigarettes  . Smokeless tobacco: Never Used  . Alcohol use No  . Drug use:     Types: Marijuana  . Sexual activity: Yes    Birth control/ protection: None     Comment: 2 weeks ago   Other Topics Concern  . Not on file   Social History Narrative  . No narrative on file    Allergies  Allergen Reactions  . Levaquin [Levofloxacin] Rash  . Metoprolol Nausea And Vomiting and Other (See Comments)    Sweats and dizziness    Family History  Problem Relation Age of Onset  . Diabetes Mother   . Heart failure Mother     Prior to Admission medications   Medication Sig Start Date End Date Taking? Authorizing Provider  albuterol (PROVENTIL HFA;VENTOLIN HFA) 108 (90 BASE) MCG/ACT inhaler Inhale 2 puffs into the lungs every 6 (six) hours as needed for wheezing or shortness of breath.    Yes Historical Provider, MD  BREO ELLIPTA 100-25 MCG/INH AEPB Inhale 1 puff into the lungs daily. 04/25/16  Yes Historical Provider, MD  Chlorpheniramine-APAP (CORICIDIN) 2-325 MG TABS Take 2 tablets by mouth every 4 (four) hours as needed (For cold symptoms.).   Yes Historical Provider, MD  clonazePAM Bobbye [place]) 1  MG tablet Take 1 mg by mouth daily as needed for anxiety.   Yes Historical Provider, MD  fenofibrate (TRICOR) 145 MG tablet Take 145 mg by mouth daily.   Yes Historical Provider, MD  FLUoxetine (PROZAC) 40 MG capsule Take 80 mg by mouth at bedtime. 04/17/16  Yes Historical Provider, MD  ibuprofen (ADVIL,MOTRIN) 200 MG tablet Take 400 mg by mouth every 6 (six) hours as needed for fever.   Yes Historical Provider, MD  levothyroxine (SYNTHROID, LEVOTHROID) 25 MCG tablet Take 25 mcg by mouth daily before breakfast.   Yes Historical Provider, MD    metFORMIN (GLUCOPHAGE) 1000 MG tablet Take 1,000 mg by mouth 2 (two) times daily with a meal.   Yes Historical Provider, MD  Multiple Vitamins-Minerals (MULTIVITAMIN WITH MINERALS) tablet Take 1 tablet by mouth daily.   Yes Historical Provider, MD  prazosin (MINIPRESS) 5 MG capsule Take 5 mg by mouth at bedtime.   Yes Historical Provider, MD  QUEtiapine (SEROQUEL) 25 MG tablet Take 25 mg by mouth at bedtime.   Yes Historical Provider, MD  valsartan (DIOVAN) 320 MG tablet Take 320 mg by mouth daily. 03/26/16  Yes Historical Provider, MD  VASCEPA 1 g CAPS Take 2 g by mouth 2 (two) times daily. 04/18/16  Yes Historical Provider, MD  amoxicillin-clavulanate (AUGMENTIN) 875-125 MG tablet Take 1 tablet by mouth every 12 (twelve) hours. Patient not taking: Reported on 07/01/2016 05/12/16   Kaitlyn Lyn Mackuen, MD  benzonatate (TESSALON PERLES) 100 MG capsule Take 1 capsule (100 mg total) by mouth 3 (three) times daily as needed for cough. Patient not taking: Reported on 07/01/2016 05/12/16   Kaitlyn Lyn Mackuen, MD  diclofenac (VOLTAREN) 75 MG EC tablet Take 1 tablet (75 mg total) by mouth 2 (two) times daily. Take with food Patient not taking: Reported on 07/01/2016 01/11/16   Melynda Ripple, MD  guaiFENesin-codeine 100-10 MG/5ML syrup Take 5 mLs by mouth every 6 (six) hours as needed for cough. Patient not taking: Reported on 07/01/2016 05/12/16   Kaitlyn Lyn Mackuen, MD  traMADol (ULTRAM) 50 MG tablet 1-2 tabs po q 6 hr prn pain Maximum dose= 8 tablets per day Patient not taking: Reported on 07/01/2016 01/11/16   Melynda Ripple, MD    Physical Exam: Vitals:   07/01/16 0745 07/01/16 0800 07/01/16 0815 07/01/16 0830  BP: (!) 150/105 152/95 (!) 149/107 (!) 144/103  Pulse: 70 79 77 75  Resp: 19 21 18 20   Temp:      TempSrc:      SpO2: 95% 95% 94% 95%  Weight:      Height:         General: morbidly obesed, appears calm and comfortable, sleeping Eyes: PERRLA, EOMI, normal lids,  iris ENT:  grossly normal hearing, lips & tongue, mucous membranes moist and intact Neck: no lymphoadenopathy, masses or thyromegaly Cardiovascular: RRR, no m/r/g. No JVD, carotid bruits. No LE edema.  Respiratory: bilateral no wheezes, rales, rhonchi or cracles. Normal respiratory effort. No accessory muscle use observed Abdomen: soft, non-tender, non-distended, no organomegaly or masses appreciated. BS present in all quadrants Skin: no rash, ulcers or induration seen on limited exam Musculoskeletal: grossly normal tone BUE/BLE, good ROM, no bony abnormality or joint deformities observed Psychiatric: grossly normal mood and affect, speech fluent and appropriate, alert and oriented x3 Neurologic: CN II-XII grossly intact, moves all extremities in coordinated fashion, sensation intact  Labs on Admission: I have personally reviewed following labs and imaging studies  CBC, BMP  GFR: Estimated  Creatinine Clearance: 120.3 mL/min (by C-G formula based on SCr of 0.96 mg/dL).   Creatinine Clearance: Estimated Creatinine Clearance: 120.3 mL/min (by C-G formula based on SCr of 0.96 mg/dL).    Radiological Exams on Admission: Dg Chest 2 View  Result Date: 07/01/2016 CLINICAL DATA:  Chest pain tonight. EXAM: CHEST  2 VIEW COMPARISON:  05/12/2016 FINDINGS: Low lung volumes persist. Interstitial prominence is stable. No focal airspace disease. Normal heart size and mediastinal contours. No pleural fluid or pneumothorax. Unchanged osseous structures. IMPRESSION: No acute abnormality. Electronically Signed   By: Jeb Levering M.D.   On: 07/01/2016 02:53    EKG: Independently reviewed - EKG showed normal sinus rhythm, occasional PVCs, no acute T-wave changes   Assessment/Plan Principal Problem:   Chest pain Active Problems:   Morbid obesity (H[facility])   PCOS (polycystic ovarian syndrome)   Hypertension   Chest Pain  Patient was seen in the past by cardiologist from the [facility] practice and was  supposed to have outpatient ischemic workup. Per request of emergency physician patient was seen by cardiology and awaits for coronary angiography Currently chest pain-free, continue to keep nothing by mouth prior to cath Follow further cardiology recommendations   HTN Suboptimally controlled on Minipress, Diovan, and amlodipine Beta blockers dietitian metoprolol was not initiated due to bleeding in the allergy list Will add hydralazine prn  PCOS - on Metformin that was placed on hold on admission Patient denied personal history of diabetes mellitus and insisted on metformin prescribed by her PCP for polycystic ovarian syndrome Hyperglycemia demonstrated by a BMP, will order hemoglobin A1c to evaluate for possibility of diabetes given her morbid obesity   Hypothyroidism Continue home dose Synthroid  Morbid obesity May benefit from nutritional consult regarding health food choices and portion sizes Lifestyle modifications such as was tobacco cessation and physical activity would be beneficial   Ongoing tobacco use NicoDerm when necessary ordered Tobacco cessation consult would be beneficial   DVT prophylaxis: Lovenox Code Status:  full  Family Communication:  none Disposition Plan:  cards telemetry  Consults called:  cardiology Admission status:  observation   Caprice Red Pager: [phone] [facility]  If 7PM-7AM, please contact night-coverage [email or web address] Password [facility]  07/01/2016, 9:29 AM

## 2016-07-01 NOTE — ED Provider Notes (Signed)
TIME SEEN: 4:10 AM  CHIEF COMPLAINT: Chest pain  HPI: Pt is a 35 y.o. female with history of borderline diabetes, hypertension, hyperlipidemia, tobacco use, family history of a mother with cardiac disease in her 2s who presents to the emergency department with complaints of left-sided chest pain that radiated up into her neck that started yesterday afternoon around 1 PM.   She had nausea and vomiting with this episode. States that her cardiologist Dr. Montez Morita gave her prescription of nitroglycerin recently and she has had intermittent chest pain and she took one and had complete resolution of pain. States last night she woke up at around 1 AM and had chest pain again in the center of her chest radiating under her neck that she describes as a squeezing pain. Did feel dizzy with it and again nauseous and vomiting. No shortness of breath or diaphoresis. Again to nitroglycerin and pain has resolved. Does have a family history of a mother who had an MI in her 80s. Patient denies history of stress test or cardiac catheterization.  No history of PE, DVT, exogenous estrogen use, recent fractures, surgery, trauma, hospitalization or prolonged travel. No lower extremity swelling or pain. No calf tenderness.   ROS: See HPI Constitutional: no fever  Eyes: no drainage  ENT: no runny nose   Cardiovascular:   chest pain  Resp: no SOB  GI: no vomiting GU: no dysuria Integumentary: no rash  Allergy: no hives  Musculoskeletal: no leg swelling  Neurological: no slurred speech ROS otherwise negative  PAST MEDICAL HISTORY/PAST SURGICAL HISTORY:  Past Medical History:  Diagnosis Date  . Anxiety   . Bipolar 1 disorder, manic, full remission ([place])   . Diabetes mellitus    boarderline diabetic  . Metabolic syndrome   . PCOS (polycystic ovarian syndrome)     MEDICATIONS:  Prior to Admission medications   Medication Sig Start Date End Date Taking? Authorizing Provider  albuterol (PROVENTIL HFA;VENTOLIN HFA)  108 (90 BASE) MCG/ACT inhaler Inhale 2 puffs into the lungs every 6 (six) hours as needed for wheezing or shortness of breath.    Yes Historical Provider, MD  BREO ELLIPTA 100-25 MCG/INH AEPB Inhale 1 puff into the lungs daily. 04/25/16  Yes Historical Provider, MD  Chlorpheniramine-APAP (CORICIDIN) 2-325 MG TABS Take 2 tablets by mouth every 4 (four) hours as needed (For cold symptoms.).   Yes Historical Provider, MD  clonazePAM (KLONOPIN) 1 MG tablet Take 1 mg by mouth daily as needed for anxiety.   Yes Historical Provider, MD  fenofibrate (TRICOR) 145 MG tablet Take 145 mg by mouth daily.   Yes Historical Provider, MD  FLUoxetine (PROZAC) 40 MG capsule Take 80 mg by mouth at bedtime. 04/17/16  Yes Historical Provider, MD  ibuprofen (ADVIL,MOTRIN) 200 MG tablet Take 400 mg by mouth every 6 (six) hours as needed for fever.   Yes Historical Provider, MD  levothyroxine (SYNTHROID, LEVOTHROID) 25 MCG tablet Take 25 mcg by mouth daily before breakfast.   Yes Historical Provider, MD  metFORMIN (GLUCOPHAGE) 1000 MG tablet Take 1,000 mg by mouth 2 (two) times daily with a meal.   Yes Historical Provider, MD  Multiple Vitamins-Minerals (MULTIVITAMIN WITH MINERALS) tablet Take 1 tablet by mouth daily.   Yes Historical Provider, MD  prazosin (MINIPRESS) 5 MG capsule Take 5 mg by mouth at bedtime.   Yes Historical Provider, MD  QUEtiapine (SEROQUEL) 25 MG tablet Take 25 mg by mouth at bedtime.   Yes Historical Provider, MD  valsartan (DIOVAN) 320  MG tablet Take 320 mg by mouth daily. 03/26/16  Yes Historical Provider, MD  VASCEPA 1 g CAPS Take 2 g by mouth 2 (two) times daily. 04/18/16  Yes Historical Provider, MD  amoxicillin-clavulanate (AUGMENTIN) 875-125 MG tablet Take 1 tablet by mouth every 12 (twelve) hours. Patient not taking: Reported on 07/01/2016 05/12/16   Courteney Lyn Mackuen, MD  benzonatate (TESSALON PERLES) 100 MG capsule Take 1 capsule (100 mg total) by mouth 3 (three) times daily as needed for  cough. Patient not taking: Reported on 07/01/2016 05/12/16   Courteney Lyn Mackuen, MD  diclofenac (VOLTAREN) 75 MG EC tablet Take 1 tablet (75 mg total) by mouth 2 (two) times daily. Take with food Patient not taking: Reported on 07/01/2016 01/11/16   Melynda Ripple, MD  guaiFENesin-codeine 100-10 MG/5ML syrup Take 5 mLs by mouth every 6 (six) hours as needed for cough. Patient not taking: Reported on 07/01/2016 05/12/16   Courteney Lyn Mackuen, MD  traMADol (ULTRAM) 50 MG tablet 1-2 tabs po q 6 hr prn pain Maximum dose= 8 tablets per day Patient not taking: Reported on 07/01/2016 01/11/16   Melynda Ripple, MD    ALLERGIES:  Allergies  Allergen Reactions  . Levaquin [Levofloxacin] Rash  . Metoprolol Nausea And Vomiting and Other (See Comments)    Sweats and dizziness    SOCIAL HISTORY:  Social History  Substance Use Topics  . Smoking status: Current Every Day Smoker    Packs/day: 0.50    Years: 20.00    Types: Cigarettes  . Smokeless tobacco: Never Used  . Alcohol use No    FAMILY HISTORY: Family History  Problem Relation Age of Onset  . Diabetes Mother   . Heart failure Mother     EXAM: BP 146/100   Pulse 66   Temp 98.7 F (37.1 C) (Oral)   Resp 20   Ht 5\' 7"  (1.702 m)   Wt (!) 320 lb (145.2 kg)   LMP 06/21/2016 (Exact Date)   SpO2 96%   BMI 50.12 kg/m  CONSTITUTIONAL: Alert and oriented and responds appropriately to questions. Morbidly obese, no significant distress HEAD: Normocephalic EYES: Conjunctivae clear, PERRL, EOMI ENT: normal nose; no rhinorrhea; moist mucous membranes NECK: Supple, no meningismus, no nuchal rigidity, no LAD  CARD: RRR; S1 and S2 appreciated; no murmurs, no clicks, no rubs, no gallops RESP: Normal chest excursion without splinting or tachypnea; breath sounds clear and equal bilaterally; no wheezes, no rhonchi, no rales, no hypoxia or respiratory distress, speaking full sentences ABD/GI: Normal bowel sounds; non-distended; soft,  non-tender, no rebound, no guarding, no peritoneal signs, no hepatosplenomegaly BACK:  The back appears normal and is non-tender to palpation, there is no CVA tenderness EXT: Normal ROM in all joints; non-tender to palpation; no edema; normal capillary refill; no cyanosis, no calf tenderness or swelling    SKIN: Normal color for age and race; warm; no rash NEURO: Moves all extremities equally, sensation to light touch intact diffusely, cranial nerves II through XII intact, normal speech PSYCH: The patient's mood and manner are appropriate. Grooming and personal hygiene are appropriate.  MEDICAL DECISION MAKING: Patient here with chest pain. Has multiple risk factors for ACS but because of her age her heart score is only 3. EKG shows no new ischemic abnormality. First troponin is negative. Chest x-ray clear. Have discussed with her at length that I recommend that she have a stress test. I do not feel it needs to be done inpatient at this time. She states she  will call her cardiologist in the morning. Plan is to repeat second troponin which would be more than 6 hours after the onset of initial symptoms.  ED PROGRESS: Patient's second troponin is mildly positive. She is still pain-free. Will discuss with cardiology. She has received aspirin yesterday.  Will give dose now.     6:50 AM  D/w Dr. [name] with cardiology. Given troponin is only mildly elevated and some of her symptoms seem to be atypical, recommends medical admission. Her PCP is Dr. Criss Rosales. We'll discuss with medicine on call. Patient comfortable with this plan. Cardiology will see in consult.  We do not feel patient needs heparin drip at this time.  Serum troponin is pending per cards recommendations.  7:15 AM  D/w Brenton Grills PA with hospitalist service. She agrees on admission to telemetry, observation. Attending is Dr. Barbaraann Faster. I will place admission orders per her request. Patient has been updated with this plan. Care transferred to  hospitalist service.   I reviewed all nursing notes, vitals, pertinent old records, EKGs, labs, imaging (as available).    EKG Interpretation  Date/Time:  Friday July 01 2016 02:10:41 EST Ventricular Rate:  63 PR Interval:    QRS Duration: 108 QT Interval:  417 QTC Calculation: 427 R Axis:   73 Text Interpretation:  Sinus rhythm Ventricular premature complex Low voltage, precordial leads No significant change since last tracing Confirmed by Malayna Noori,  DO, Hibba Schram [phone]) on 07/01/2016 2:13:43 AM         [place], DO 07/01/16 [id number]

## 2016-07-01 NOTE — Progress Notes (Signed)
TR BAND REMOVAL  LOCATION:    right radial  DEFLATED PER PROTOCOL:    Yes.    TIME BAND OFF / DRESSING APPLIED:    2330    SITE UPON ARRIVAL:    Level 0  SITE AFTER BAND REMOVAL:    Level 0  CIRCULATION SENSATION AND MOVEMENT:    Within Normal Limits   Yes.    COMMENTS:   Radial site teaching completed. Teach back done.

## 2016-07-01 NOTE — Progress Notes (Signed)
Report received for pt coming to 2w29.

## 2016-07-01 NOTE — Interval H&P Note (Signed)
History and Physical Interval Note:  07/01/2016 5:07 PM  Kaitlyn Chambers  has presented today for cardiac cath with the diagnosis of chest pain/NSTEMI. The various methods of treatment have been discussed with the patient and family. After consideration of risks, benefits and other options for treatment, the patient has consented to  Procedure(s): Right/Left Heart Cath and Coronary Angiography (N/A) as a surgical intervention .  The patient's history has been reviewed, patient examined, no change in status, stable for surgery.  I have reviewed the patient's chart and labs.  Questions were answered to the patient's satisfaction.    Cath Lab Visit (complete for each Cath Lab visit)  Clinical Evaluation Leading to the Procedure:   ACS: Yes.    Non-ACS:    Anginal Classification: CCS III  Anti-ischemic medical therapy: No Therapy  Non-Invasive Test Results: No non-invasive testing performed  Prior CABG: No previous CABG         Lauree Chandler

## 2016-07-01 NOTE — ED Notes (Signed)
Pt states she called 9-1-1 2 times. States the pain would go away but come back worse. Pt states she waited an hour before calling 9-1-1. Pt states she took nitro with relief so she went to sleep. Pt states she woke up to chest pain, took another nitro, and called 9-1-1.

## 2016-07-01 NOTE — Consult Note (Signed)
Cardiology Consult    Patient ID: Kaitlyn Chambers MRN: [id number], DOB/AGE: 1981-04-15   Admit date: 07/01/2016 Date of Consult: 07/01/2016  Primary Physician: Elyn Peers, MD Reason for Consult: Chest pain Primary Cardiologist: New, Dr. [name] Requesting Provider: Dr. Marily Memos   Patient Profile    Kaitlyn Chambers is a 35 year old female with a past medical history of anxiety, Bipolar disorder, DM, current tobacco abuse and PCOS. She presented to the ED on 07/01/16 with chest pain.   History of Present Illness    Kaitlyn Chambers was resting in her bed yesterday afternoon around 2:00 pm and started having substernal chest pain with radiation to the left side of her jaw.   She continued to rest but had intermittent chest pain all afternoon. Her pain waxed and waned in severity and was associated with nausea and vomiting. She called EMS and was given SL Nitro. This relieved her pain and she did not want to be transported to the hospital.   She then had worsening chest pain that awakened her from sleep, so she called 911 again. Her initial troponin was 0.05. She is a current every day smoker, also admits to marijuana use. She did use marijuana last night.   She tells me that her mother had an MI at the age of 32 and did have stenting. She is still having chest pain at the time of my encounter.   Past Medical History   Past Medical History:  Diagnosis Date  . Anxiety   . Bipolar 1 disorder, manic, full remission ([place])   . Diabetes mellitus    boarderline diabetic  . Metabolic syndrome   . PCOS (polycystic ovarian syndrome)     Past Surgical History:  Procedure Laterality Date  . CARPECTOMY       Allergies  Allergies  Allergen Reactions  . Levaquin [Levofloxacin] Rash  . Metoprolol Nausea And Vomiting and Other (See Comments)    Sweats and dizziness    Inpatient Medications      Family History    Family History  Problem Relation Age of Onset  . Diabetes  Mother   . Heart failure Mother     Social History    Social History   Social History  . Marital status: Divorced    Spouse name: N/A  . Number of children: N/A  . Years of education: N/A   Occupational History  . Not on file.   Social History Main Topics  . Smoking status: Current Every Day Smoker    Packs/day: 0.50    Years: 20.00    Types: Cigarettes  . Smokeless tobacco: Never Used  . Alcohol use No  . Drug use:     Types: Marijuana  . Sexual activity: Yes    Birth control/ protection: None     Comment: 2 weeks ago   Other Topics Concern  . Not on file   Social History Narrative  . No narrative on file     Review of Systems    General:  No chills, fever, night sweats or weight changes.  Cardiovascular:  + chest pain, dyspnea on exertion, edema, orthopnea, palpitations, paroxysmal nocturnal dyspnea. Dermatological: No rash, lesions/masses Respiratory: No cough, dyspnea Urologic: No hematuria, dysuria Abdominal:   + nausea, + vomiting, diarrhea, bright red blood per rectum, melena, or hematemesis Neurologic:  No visual changes, wkns, changes in mental status. All other systems reviewed and are otherwise negative except as noted above.  Physical Exam  Blood pressure (!) 150/105, pulse 70, temperature 98.7 F (37.1 C), temperature source Oral, resp. rate 19, height 5\' 7"  (1.702 m), weight (!) 320 lb (145.2 kg), last menstrual period 06/21/2016, SpO2 95 %.  General: Obese female Psych: Normal affect. Neuro: Alert and oriented X 3. Moves all extremities spontaneously. HEENT: Normal  Neck: Supple without bruits or JVD. Lungs:  Resp regular and unlabored, CTA. Heart: RRR no s3, s4, or murmurs. Abdomen: Soft, non-tender, non-distended, BS + x 4.  Extremities: No clubbing, cyanosis or edema. DP/PT/Radials 2+ and equal bilaterally.  Labs    Troponin [facility] of Care Test)  Recent Labs  07/01/16 0625  TROPIPOC 0.09*    Recent Labs  07/01/16 0642    TROPONINI 0.05*   Lab Results  Component Value Date   WBC 12.0 (H) 07/01/2016   HGB 14.1 07/01/2016   HCT 40.5 07/01/2016   MCV 89.4 07/01/2016   PLT 245 07/01/2016    Recent Labs Lab 07/01/16 0241  NA 139  K 3.9  CL 103  CO2 27  BUN 13  CREATININE 0.96  CALCIUM 9.1  GLUCOSE 169*   No results found for: CHOL, HDL, LDLCALC, TRIG Lab Results  Component Value Date   DDIMER <0.27 07/02/2014     Radiology Studies    Dg Chest 2 View  Result Date: 07/01/2016 CLINICAL DATA:  Chest pain tonight. EXAM: CHEST  2 VIEW COMPARISON:  05/12/2016 FINDINGS: Low lung volumes persist. Interstitial prominence is stable. No focal airspace disease. Normal heart size and mediastinal contours. No pleural fluid or pneumothorax. Unchanged osseous structures. IMPRESSION: No acute abnormality. Electronically Signed   By: Jeb Levering M.D.   On: 07/01/2016 02:53    EKG & Cardiac Imaging    EKG: NSR, low voltage in precordial leads.    Assessment & Plan    1. Unstable angina/NSTEMI: Presented with chest pain that awakened her from sleep with associated SOB and nausea/vomiting. She has a family history of MI and has risk factors of DM and tobacco abuse.   It would be hard to get a reliable result by stress test due to her weight, so we will proceed with heart cath given her symptoms. Given her persistently elevated troponin albeit low level and her symptoms we have decided to proceed with catheterization.  2. DM: Per primary team  3. Family history of CAD    Signed, Arbutus Leas, NP 07/01/2016, 8:14 AM Pager: [phone]  Personally seen and examined. Agree with above.  35 year old female with morbid obesity, smoker, diabetes with early family history of CAD, mother MI in her 21s who presented with symptoms of both typical and atypical chest pain, nausea, pain radiating to her jaw and arm with second troponin coming back mildly positive at 0.05, non-ST elevation myocardial  infarction.  Non-ST elevation myocardial infarction  - Her minimally elevated troponin in the setting of her chest discomfort with both typical and atypical features could be an element of demand ischemia however given her body habitus, at times, convincing symptoms, strong risk factors, smoking history we will proceed with cardiac catheterization to get a definitive diagnosis. She's been to the emergency room in the past with chest pain. A nuclear stress test would likely have low sensitivity given her body habitus. We discussed cardiac catheterization at length the risks of stroke, heart attack, renal impairment, death. She is going to proceed. She is anxious and cried at times, remembering that her grandmother died on an operating table. Tried to reassure  her.  - Heart regular rate and rhythm, distant heart sounds, no appreciable rubs, lungs are clear. Multiple tattoos throughout body. Excellent radial pulse.  - Aspirin. She is supposedly allergic to beta blockers causing nausea and vomiting. We will hold off at this time. Statin. Blood pressure control. EKG with no obvious ST segment elevation. Assess ejection fraction during heart catheterization.  Uncontrolled hypertension  - She is on antihypertensives at home.  - I will give her Norvasc 5 mg now.  - I will add a nitroglycerin drip as well in the meantime.  Morbid obesity  - Encourage weight loss.  Tobacco use  - Encourage cessation  - She said that she was going to try Chantix but her psychiatrist went on a sabbatical is.  Bipolar disorder  - Per primary team  -

## 2016-07-01 NOTE — H&P (View-Only) (Signed)
Cardiology Consult    Patient ID: Kaitlyn Chambers MRN: [id number], DOB/AGE: 37-Aug-1982   Admit date: 07/01/2016 Date of Consult: 07/01/2016  Primary Physician: Elyn Peers, MD Reason for Consult: Chest pain Primary Cardiologist: New, Dr. [name] Requesting Provider: Dr. Marily Memos   Patient Profile    Kaitlyn Chambers is a 35 year old female with a past medical history of anxiety, Bipolar disorder, DM, current tobacco abuse and PCOS. She presented to the ED on 07/01/16 with chest pain.   History of Present Illness    Kaitlyn Chambers was resting in her bed yesterday afternoon around 2:00 pm and started having substernal chest pain with radiation to the left side of her jaw.   She continued to rest but had intermittent chest pain all afternoon. Her pain waxed and waned in severity and was associated with nausea and vomiting. She called EMS and was given SL Nitro. This relieved her pain and she did not want to be transported to the hospital.   She then had worsening chest pain that awakened her from sleep, so she called 911 again. Her initial troponin was 0.05. She is a current every day smoker, also admits to marijuana use. She did use marijuana last night.   She tells me that her mother had an MI at the age of 22 and did have stenting. She is still having chest pain at the time of my encounter.   Past Medical History   Past Medical History:  Diagnosis Date  . Anxiety   . Bipolar 1 disorder, manic, full remission ([place])   . Diabetes mellitus    boarderline diabetic  . Metabolic syndrome   . PCOS (polycystic ovarian syndrome)     Past Surgical History:  Procedure Laterality Date  . CARPECTOMY       Allergies  Allergies  Allergen Reactions  . Levaquin [Levofloxacin] Rash  . Metoprolol Nausea And Vomiting and Other (See Comments)    Sweats and dizziness    Inpatient Medications      Family History    Family History  Problem Relation Age of Onset  . Diabetes  Mother   . Heart failure Mother     Social History    Social History   Social History  . Marital status: Divorced    Spouse name: N/A  . Number of children: N/A  . Years of education: N/A   Occupational History  . Not on file.   Social History Main Topics  . Smoking status: Current Every Day Smoker    Packs/day: 0.50    Years: 20.00    Types: Cigarettes  . Smokeless tobacco: Never Used  . Alcohol use No  . Drug use:     Types: Marijuana  . Sexual activity: Yes    Birth control/ protection: None     Comment: 2 weeks ago   Other Topics Concern  . Not on file   Social History Narrative  . No narrative on file     Review of Systems    General:  No chills, fever, night sweats or weight changes.  Cardiovascular:  + chest pain, dyspnea on exertion, edema, orthopnea, palpitations, paroxysmal nocturnal dyspnea. Dermatological: No rash, lesions/masses Respiratory: No cough, dyspnea Urologic: No hematuria, dysuria Abdominal:   + nausea, + vomiting, diarrhea, bright red blood per rectum, melena, or hematemesis Neurologic:  No visual changes, wkns, changes in mental status. All other systems reviewed and are otherwise negative except as noted above.  Physical Exam  Blood pressure (!) 150/105, pulse 70, temperature 98.7 F (37.1 C), temperature source Oral, resp. rate 19, height 5\' 7"  (1.702 m), weight (!) 320 lb (145.2 kg), last menstrual period 06/21/2016, SpO2 95 %.  General: Obese female Psych: Normal affect. Neuro: Alert and oriented X 3. Moves all extremities spontaneously. HEENT: Normal  Neck: Supple without bruits or JVD. Lungs:  Resp regular and unlabored, CTA. Heart: RRR no s3, s4, or murmurs. Abdomen: Soft, non-tender, non-distended, BS + x 4.  Extremities: No clubbing, cyanosis or edema. DP/PT/Radials 2+ and equal bilaterally.  Labs    Troponin [facility] of Care Test)  Recent Labs  07/01/16 0625  TROPIPOC 0.09*    Recent Labs  07/01/16 0642    TROPONINI 0.05*   Lab Results  Component Value Date   WBC 12.0 (H) 07/01/2016   HGB 14.1 07/01/2016   HCT 40.5 07/01/2016   MCV 89.4 07/01/2016   PLT 245 07/01/2016    Recent Labs Lab 07/01/16 0241  NA 139  K 3.9  CL 103  CO2 27  BUN 13  CREATININE 0.96  CALCIUM 9.1  GLUCOSE 169*   No results found for: CHOL, HDL, LDLCALC, TRIG Lab Results  Component Value Date   DDIMER <0.27 07/02/2014     Radiology Studies    Dg Chest 2 View  Result Date: 07/01/2016 CLINICAL DATA:  Chest pain tonight. EXAM: CHEST  2 VIEW COMPARISON:  05/12/2016 FINDINGS: Low lung volumes persist. Interstitial prominence is stable. No focal airspace disease. Normal heart size and mediastinal contours. No pleural fluid or pneumothorax. Unchanged osseous structures. IMPRESSION: No acute abnormality. Electronically Signed   By: Jeb Levering M.D.   On: 07/01/2016 02:53    EKG & Cardiac Imaging    EKG: NSR, low voltage in precordial leads.    Assessment & Plan    1. Unstable angina/NSTEMI: Presented with chest pain that awakened her from sleep with associated SOB and nausea/vomiting. She has a family history of MI and has risk factors of DM and tobacco abuse.   It would be hard to get a reliable result by stress test due to her weight, so we will proceed with heart cath given her symptoms. Given her persistently elevated troponin albeit low level and her symptoms we have decided to proceed with catheterization.  2. DM: Per primary team  3. Family history of CAD    Signed, Arbutus Leas, NP 07/01/2016, 8:14 AM Pager: [phone]  Personally seen and examined. Agree with above.  35 year old female with morbid obesity, smoker, diabetes with early family history of CAD, mother MI in her 47s who presented with symptoms of both typical and atypical chest pain, nausea, pain radiating to her jaw and arm with second troponin coming back mildly positive at 0.05, non-ST elevation myocardial  infarction.  Non-ST elevation myocardial infarction  - Her minimally elevated troponin in the setting of her chest discomfort with both typical and atypical features could be an element of demand ischemia however given her body habitus, at times, convincing symptoms, strong risk factors, smoking history we will proceed with cardiac catheterization to get a definitive diagnosis. She's been to the emergency room in the past with chest pain. A nuclear stress test would likely have low sensitivity given her body habitus. We discussed cardiac catheterization at length the risks of stroke, heart attack, renal impairment, death. She is going to proceed. She is anxious and cried at times, remembering that her grandmother died on an operating table. Tried to reassure  her.  - Heart regular rate and rhythm, distant heart sounds, no appreciable rubs, lungs are clear. Multiple tattoos throughout body. Excellent radial pulse.  - Aspirin. She is supposedly allergic to beta blockers causing nausea and vomiting. We will hold off at this time. Statin. Blood pressure control. EKG with no obvious ST segment elevation. Assess ejection fraction during heart catheterization.  Uncontrolled hypertension  - She is on antihypertensives at home.  - I will give her Norvasc 5 mg now.  - I will add a nitroglycerin drip as well in the meantime.  Morbid obesity  - Encourage weight loss.  Tobacco use  - Encourage cessation  - She said that she was going to try Chantix but her psychiatrist went on a sabbatical is.  Bipolar disorder  - Per primary team  -

## 2016-07-01 NOTE — Progress Notes (Signed)
CRITICAL VALUE ALERT  Critical value received:  Troponin 15.91  Date of notification: 07/01/2016   Time of notification:  2110  Critical value read back:Yes.    Nurse who received alert:  Helayne Seminole, RN   MD notified (1st page):   Yes. 2115  Time of first page:  2115  MD notified (2nd page): n/a   Time of second page:  Responding MD:  Dr. Lamona Curl   Time MD responded:  2120

## 2016-07-01 NOTE — ED Notes (Signed)
Pt oob to bedside commode for 400 cc urine

## 2016-07-01 NOTE — ED Triage Notes (Signed)
EMS reports this is their second time out to this residence for the patient.Initially the pt took 2 nitro with relief. EMS reports patient went to bed after taking her BP meds and was awakened by the pain. EMS states the patient took another nitro prior to calling 9-1-1. EMS reports they gave her 324 ASA, 1 nitro, 4mg  of Zofran, and a 100 ml bolus through a 20 g IV in the pt's hand.

## 2016-07-01 NOTE — ED Notes (Signed)
Cardiology PA at bedside. 

## 2016-07-01 NOTE — ED Notes (Addendum)
Pt c/o increased chest pain 1 with intermittent nausea and sweating. Nitro gtt increased to 20 mcg. BP 127/91. Dr. Marily Memos aware

## 2016-07-02 DIAGNOSIS — E282 Polycystic ovarian syndrome: Secondary | ICD-10-CM

## 2016-07-02 DIAGNOSIS — I10 Essential (primary) hypertension: Secondary | ICD-10-CM

## 2016-07-02 LAB — LIPID PANEL
CHOL/HDL RATIO: 8 ratio
Cholesterol: 225 mg/dL — ABNORMAL HIGH (ref 0–200)
HDL: 28 mg/dL — AB (ref >40)
LDL CALC: UNDETERMINED mg/dL (ref 0–99)
TRIGLYCERIDES: 510 mg/dL — AB (ref <150)
VLDL: UNDETERMINED mg/dL (ref 0–40)

## 2016-07-02 LAB — CBC
HEMATOCRIT: 41.2 % (ref 36.0–46.0)
Hemoglobin: 14.1 g/dL (ref 12.0–15.0)
MCH: 30.7 pg (ref 26.0–34.0)
MCHC: 34.2 g/dL (ref 30.0–36.0)
MCV: 89.8 fL (ref 78.0–100.0)
Platelets: 233 10*3/uL (ref 150–400)
RBC: 4.59 MIL/uL (ref 3.87–5.11)
RDW: 13 % (ref 11.5–15.5)
WBC: 11.4 10*3/uL — AB (ref 4.0–10.5)

## 2016-07-02 LAB — BASIC METABOLIC PANEL
ANION GAP: 9 (ref 5–15)
BUN: 10 mg/dL (ref 6–20)
CO2: 25 mmol/L (ref 22–32)
Calcium: 9.1 mg/dL (ref 8.9–10.3)
Chloride: 104 mmol/L (ref 101–111)
Creatinine, Ser: 0.85 mg/dL (ref 0.44–1.00)
GLUCOSE: 121 mg/dL — AB (ref 65–99)
POTASSIUM: 3.7 mmol/L (ref 3.5–5.1)
Sodium: 138 mmol/L (ref 135–145)

## 2016-07-02 LAB — GLUCOSE, CAPILLARY
Glucose-Capillary: 153 mg/dL — ABNORMAL HIGH (ref 65–99)
Glucose-Capillary: 134 mg/dL — ABNORMAL HIGH (ref 65–99)

## 2016-07-02 MED ORDER — LIVING WELL WITH DIABETES BOOK
Freq: Once | Status: AC
  Administered 2016-07-02: 23:00:00
  Filled 2016-07-02: qty 1

## 2016-07-02 MED ORDER — ASPIRIN EC 81 MG PO TBEC
81.0000 mg | DELAYED_RELEASE_TABLET | Freq: Every day | ORAL | Status: DC
  Administered 2016-07-02 – 2016-07-03 (×2): 81 mg via ORAL
  Filled 2016-07-02 (×2): qty 1

## 2016-07-02 MED ORDER — CLONAZEPAM 0.5 MG PO TABS
1.0000 mg | ORAL_TABLET | Freq: Three times a day (TID) | ORAL | Status: DC
  Administered 2016-07-02 – 2016-07-03 (×2): 1 mg via ORAL
  Filled 2016-07-02 (×2): qty 2

## 2016-07-02 MED ORDER — INSULIN ASPART 100 UNIT/ML ~~LOC~~ SOLN: 0.0000 [IU] | Freq: Every day | SUBCUTANEOUS | Status: DC

## 2016-07-02 MED ORDER — ONDANSETRON HCL 4 MG/2ML IJ SOLN: 4.0000 mg | Freq: Three times a day (TID) | INTRAMUSCULAR | Status: DC | PRN

## 2016-07-02 MED ORDER — IRBESARTAN 75 MG PO TABS
75.0000 mg | ORAL_TABLET | Freq: Every day | ORAL | Status: DC
  Administered 2016-07-02 – 2016-07-03 (×2): 75 mg via ORAL
  Filled 2016-07-02 (×2): qty 1

## 2016-07-02 MED ORDER — INSULIN ASPART 100 UNIT/ML ~~LOC~~ SOLN
0.0000 [IU] | Freq: Three times a day (TID) | SUBCUTANEOUS | Status: DC
  Administered 2016-07-02: 2 [IU] via SUBCUTANEOUS
  Administered 2016-07-03: 1 [IU] via SUBCUTANEOUS

## 2016-07-02 NOTE — Progress Notes (Signed)
Patient Name: Kaitlyn Chambers Date of Encounter: 07/02/2016  Primary Cardiologist: [facility] Problem List     Principal Problem:   Chest pain Active Problems:   Morbid obesity (HCC)   PCOS (polycystic ovarian syndrome)   Hypertension   NSTEMI (non-ST elevated myocardial infarction) ([place])     Subjective   She is feeling much better after catheterization. "A weight was lifted off my chest ". She really wants to go home to be with her 3 children. No shortness of breath. [place] in bed. Blood pressure quite soft 85 this morning.  Inpatient Medications    Scheduled Meds: . aspirin EC  81 mg Oral Daily  . atorvastatin  80 mg Oral q1800  . fenofibrate  160 mg Oral Daily  . FLUoxetine  80 mg Oral QHS  . fluticasone furoate-vilanterol  1 puff Inhalation Daily  . irbesartan  75 mg Oral Daily  . levothyroxine  25 mcg Oral QAC breakfast  . nicotine  14 mg Transdermal Daily  . omega-3 acid ethyl esters  2 g Oral BID  . ondansetron (ZOFRAN) IV  4 mg Intravenous Q6H  . pneumococcal 23 valent vaccine  0.5 mL Intramuscular Tomorrow-1000  . QUEtiapine  25 mg Oral QHS  . sodium chloride flush  3 mL Intravenous Q12H  . ticagrelor  90 mg Oral BID   Continuous Infusions:  PRN Meds: sodium chloride, acetaminophen **OR** acetaminophen, albuterol, bisacodyl, clonazePAM, gi cocktail, hydrALAZINE, morphine injection, nitroGLYCERIN, polyethylene glycol, sodium chloride flush, traZODone   Vital Signs    Vitals:   07/02/16 0345 07/02/16 0403 07/02/16 0710 07/02/16 0712  BP: (!) 108/47 (!) 91/50 (!) 88/27 (!) 83/42  Pulse: 78 80 79   Resp: (!) 21 (!) 21 (!) 22   Temp:  97.1 F (36.2 C) 98.2 F (36.8 C)   TempSrc:  Axillary Oral   SpO2: 93% 97% 95%   Weight:  (!) 337 lb 4.9 oz (153 kg)    Height:        Intake/Output Summary (Last 24 hours) at 07/02/16 0812 Last data filed at 07/02/16 0352  Gross per 24 hour  Intake          1110.64 ml  Output                0 ml    Net          1110.64 ml   Filed Weights   07/01/16 0218 07/02/16 0403  Weight: (!) 320 lb (145.2 kg) (!) 337 lb 4.9 oz (153 kg)    Physical Exam    GEN: Well nourished, well developed, in no acute distress. Obese HEENT: Grossly normal.  Neck: Supple, no JVD, carotid bruits, or masses. Cardiac: RRR, no murmurs, rubs, or gallops. No clubbing, cyanosis, edema.  Radials/DP/PT 2+ and equal bilaterally.  Respiratory:  Respirations regular and unlabored, clear to auscultation bilaterally. GI: Soft, nontender, nondistended, BS + x 4. MS: no deformity or atrophy. Skin: warm and dry, no rash. Tattoos, radial site appears normal. Neuro:  Strength and sensation are intact. Psych: AAOx3.  Normal affect.  Labs    CBC  Recent Labs  07/01/16 0241 07/01/16 0910 07/02/16 0229  WBC 12.0* 11.9* 11.4*  NEUTROABS 7.6  --   --   HGB 14.1 14.2 14.1  HCT 40.5 40.1 41.2  MCV 89.4 88.7 89.8  PLT 245 243 0000000   Basic Metabolic Panel  Recent Labs  07/01/16 0241 07/01/16 0910 07/02/16 0229  NA 139  --  138  K 3.9  --  3.7  CL 103  --  104  CO2 27  --  25  GLUCOSE 169*  --  121*  BUN 13  --  10  CREATININE 0.96 0.84 0.85  CALCIUM 9.1  --  9.1   Liver Function Tests No results for input(s): AST, ALT, ALKPHOS, BILITOT, PROT, ALBUMIN in the last 72 hours. No results for input(s): LIPASE, AMYLASE in the last 72 hours. Cardiac Enzymes  Recent Labs  07/01/16 0910 07/01/16 1545 07/01/16 1958  TROPONINI 0.29* 2.49* 15.91*   BNP Invalid input(s): POCBNP D-Dimer No results for input(s): DDIMER in the last 72 hours. Hemoglobin A1C  Recent Labs  07/01/16 0944  HGBA1C 6.7*   Fasting Lipid Panel No results for input(s): CHOL, HDL, LDLCALC, TRIG, CHOLHDL, LDLDIRECT in the last 72 hours. Thyroid Function Tests No results for input(s): TSH, T4TOTAL, T3FREE, THYROIDAB in the last 72 hours.  Invalid input(s): FREET3  Telemetry    Isolated couplet noted, no adverse arrhythmias,  sinus rhythm - Personally Reviewed  ECG    Original EKG with normal sinus rhythm- Personally Reviewed  Radiology    Dg Chest 2 View  Result Date: 07/01/2016 CLINICAL DATA:  Chest pain tonight. EXAM: CHEST  2 VIEW COMPARISON:  05/12/2016 FINDINGS: Low lung volumes persist. Interstitial prominence is stable. No focal airspace disease. Normal heart size and mediastinal contours. No pleural fluid or pneumothorax. Unchanged osseous structures. IMPRESSION: No acute abnormality. Electronically Signed   By: Jeb Levering M.D.   On: 07/01/2016 02:53    Cardiac Studies   Cardiac catheterization: 07/01/16 Burnell Blanks, MD 07/01/2016 Routine  Narrative & Impression      The left ventricular ejection fraction is 50-55% by visual estimate.  The left ventricular systolic function is normal.  LV end diastolic pressure is normal.  There is no mitral valve regurgitation.  Mid RCA lesion, 100 %stenosed.  A STENT SYNERGY DES 3X20 drug eluting stent was successfully placed.  Prox Cx to Mid Cx lesion, 100 %stenosed.  Post intervention, there is a 0% residual stenosis.   1. Acute lateral MI secondary to occluded Circumflex (NSTEMI) 2. Chronic occlusion proximal RCA with filling of distal vessel from left to right collaterals.  3. Preserved LV systolic function 4. Successful PTCA/DES x 1 mid Circumflex.  Recommendations: DAPT for one year with ASA/Brilinta. Continue statin. Beta blocker as tolerated.         Patient Profile     35 year old female with non-ST elevation myocardial infarction with cardiac catheterization demonstrated occluded right coronary artery with collateral blood flow, occluded circumflex artery with successful drug eluting stent placement, morbid obesity, marijuana use, diabetes, hypertension  Assessment & Plan    Non-ST elevation myocardial infarction  - DES to circumflex. No intervention to chronically occluded right coronary with collateral blood  flow. Preserved LV function on catheterization.  - Cardiac rehabilitation  - No adverse arrhythmias overnight, couplet only  - Blood pressure was low overnight, mid 80s. I have discontinued her amlodipine 5 mg and praises and that she takes before bedtime and have cut back her irbesartan from 300 mg once a day down to 75 mg once a day.  - Continue with dual antiplatelet therapy, aspirin, Brilinta for one year.  - I did not add beta blocker because of hypotension but this would be advisable.  - This makes me wonder if she has been taking her medications at home.  - Encourage tobacco cessation, marijuana cessation  -  I would like to watch her over the next 24 hours to ensure that she does not have any adverse arrhythmias and then hopeful discharge tomorrow.  Morbid obesity  - Continue to encourage weight loss  Diabetes  - Per primary team, weight loss  Essential hypertension  - Medication changes made as above.  Signed, Candee Furbish, MD  07/02/2016, 8:12 AM

## 2016-07-02 NOTE — Progress Notes (Signed)
CARDIAC REHAB PHASE I   PRE:  Rate/Rhythm: 90 SR  BP:  Supine:   Sitting: 116/57  Standing:     SaO2:   MODE:  Ambulation: 1000 ft   POST:  Rate/Rhythm: 105 ST  BP:  Supine:   Sitting: 132/79   Standing:    SaO2:  0915-1025 Pt tolerated ambulation well without c/o of cp or SOB. VS stable. Pt to side of bed after walk with call light in reach. Completed MI and Stent education with pt, sister and finance. Pt voces understanding. We discussed smoking cessation and I strongly encouraged it. I gave pt cessation information and fake cigarette. Pt seems very determined to quit. We discussed her A1C. She is asking for help to find a new PCP, also asking for a glucose meter and would like a referral to Outpt. Diabetic education. I will send referral to [place]. CRP. She is feeling overwhelmed with all that has happened to her and became tearful during education. I ask her RN if she could place a care manager consult for the pt.  Rodney Langton RN 07/02/2016 10:24 AM

## 2016-07-02 NOTE — Progress Notes (Signed)
Inpatient Diabetes Program Recommendations  AACE/ADA: New Consensus Statement on Inpatient Glycemic Control (2015)  Target Ranges:  Prepandial:   less than 140 mg/dL      Peak postprandial:   less than 180 mg/dL (1-2 hours)      Critically ill patients:  140 - 180 mg/dL   Results for AVAIAH, GULICK (MRN [id number]) as of 07/02/2016 14:26  Ref. Range 07/01/2016 02:41 07/01/2016 09:44 07/02/2016 02:29  Glucose Latest Ref Range: 65 - 99 mg/dL 169 (H)  121 (H)  Hemoglobin A1C Latest Ref Range: 4.8 - 5.6 %  6.7 (H)     Review of Glycemic Control  Diabetes history: No Outpatient Diabetes medications: Metformin 1000 mg BID (per patient taking for PCOS) Current orders for Inpatient glycemic control: None  Inpatient Diabetes Program Recommendations: Insulin-Correction: While inpatient, please consider ordering CBGs and Novolog correction scale ACHS. Oral DM medication: Please consider ordering Metformin 1000 mg BID (as taken as an outpatient). HgbA1C: A1C 6.7% on 07/01/16 indicating an average glucose of 146 mg/dl over the past 2-3 months. Per chart review, patient is being diagnosed with DM2. Outpatient Referral: Recommend patient establish care with PCP and follow up regarding DM control.  NOTE: Spoke with patient over the phone about new diabetes diagnosis. Patient reports that she has never been dx with DM in the past. Patient states that she was taking Metformin for PCOS and Metabolic Syndrome. Patient reports that her mother had DM and passed away at the age of 37 years old and that she had several complications from uncontrolled DM.   Discussed A1C results (6.7%on 07/01/16) and explained what an A1C is and informed patient that her current A1C indicates an average glucose of 146 mg/dl over the past 2-3 months. Discussed basic pathophysiology of DM Type 2, basic home care, importance of checking CBGs and maintaining good CBG control to prevent long-term and short-term complications.  Reviewed glucose and A1C goals.  Discussed impact of nutrition, exercise, stress, sickness, and medications on diabetes control. Informed patient she should be receiving Living Well with diabetes booklet and encouraged patient to read through entire book once she receives it. Patient reports that she use to check her own glucose and she also use to check her mother's glucose.   Asked patient to check her glucose at least 2 x per day and to keep a log book of glucose readings. Patient reports that she has not seen her PCP in "a long time" and that she will need to get a new PCP and she has asked for a list of providers taking new patients in the area.  Encouraged patient to call and get established with new PCP as soon as possible.  Explained how the doctor she follows up with can use the log book to continue to make any needed adjustments with DM medications.  Patient states that she knows first hand what her mother went through due to uncontrolled DM and she plans to do everything she can to take care of herself.  Patient verbalized understanding of information discussed and she states that she has no further questions at this time related to diabetes.   RNs to provide ongoing basic DM education at bedside with this patient. Anticipate patient can continue to take Metformin and make lifestyle changes (diet and exercise) for DM control at this time.  Thanks, Barnie Alderman, RN, MSN, CDE Diabetes Coordinator Inpatient Diabetes Program [phone] (Team Pager from 8am to 5pm)

## 2016-07-02 NOTE — Progress Notes (Signed)
PROGRESS NOTE  Kaitlyn Chambers  [id number] DOB: September 22, 1980  DOA: 07/01/2016 PCP: Elyn Peers, MD   Brief Narrative:  35 year old female with PMH of morbid obesity, PCOS/metabolic syndrome on metformin (denied history of DM), HTN, bipolar disorder, tobacco & THC abuse presented to [facility] ED on 07/01/16 with substernal chest pain radiating to left side of her jaw at around 2 PM on 12/28. She continued to rest but had intermittent chest pain on the afternoon. Her pain waxed and waned in severity and associated with nausea and vomiting. EMS was called and was given sublingual NTG which relieved her pain and she did not want to be transported to the hospital. She then had worsening chest pain that awakened her from sleep and she called 911 again. Family history of MI in mother at age 66 who had stenting. Cardiology assessed her with NSTEMI and she underwent cardiac cath and DES to circumflex on 12/29.   Assessment & Plan:   Principal Problem:   Chest pain Active Problems:   Morbid obesity ([place])   PCOS (polycystic ovarian syndrome)   Hypertension   NSTEMI (non-ST elevated myocardial infarction) ([place])   1. NSTEMI: Patient presented with chest pain with both typical and atypical features. Prior to cath troponin peaked at 2.49 which probably increased to 15.91 after cath. Given her multiple significant risk factors (obesity, typical and atypical symptoms, tobacco abuse, family history, metabolic syndrome) she underwent Cardiac catheterization and DES to circumflex. No intervention to chronically occluded RCA with collateral blood flow. Preserved LV function on catheterization and by echo. Cardiac rehabilitation. Continue the DAPT (aspirin and Brilinta) for one year. Beta blockers when blood pressure allows. Tobacco cessation counseled. 2. Uncontrolled hypertension: Blood pressures low overnight and this morning. Amlodipine and Minipress discontinued. Irbesarten reduced from 300 mg to 75 mg  daily. 3. Morbid obesity/Body mass index is 52.83 kg/m.: Eventually will need diet, exercise and weight loss. 4. Tobacco & THC abuse: Cessation counseled. 5. Bipolar disorder/anxiety: Continue home medications. 6. New type II DM: Hemoglobin A1c 6.7 on 07/01/16. Check CBGs and will order SSI while inpatient. Continue metformin 1 g twice a day as outpatient from 07/04/16. Diabetes coordinator consultation appreciated. Patient has been counseled. 7. PCOS: Continue metformin as outpatient. 8. Hypothyroid: Continue Synthroid. 9. Hyperlipidemia: Continue atorvastatin, fenofibrate and omega-3   DVT prophylaxis: Lovenox  Code Status: Full  Family Communication: Discussed with patient's fianc and sister at bedside  Disposition Plan: Monitor overnight and possible discharge in 12/31.   Consultants:   Cardiology   Procedures:   Cardiac catheterization 07/01/16: Narrative & Impression      The left ventricular ejection fraction is 50-55% by visual estimate.  The left ventricular systolic function is normal.  LV end diastolic pressure is normal.  There is no mitral valve regurgitation.  Mid RCA lesion, 100 %stenosed.  A STENT SYNERGY DES 3X20 drug eluting stent was successfully placed.  Prox Cx to Mid Cx lesion, 100 %stenosed.  Post intervention, there is a 0% residual stenosis.   1. Acute lateral MI secondary to occluded Circumflex (NSTEMI) 2. Chronic occlusion proximal RCA with filling of distal vessel from left to right collaterals.  3. Preserved LV systolic function 4. Successful PTCA/DES x 1 mid Circumflex.  Recommendations: DAPT for one year with ASA/Brilinta. Continue statin. Beta blocker as tolerated    Antimicrobials:   None  Subjective: Seen this morning. Ambulating comfortably with cardiac rehabilitation. No chest pain, dyspnea, palpitations or dizziness reported. Anxious to go home.   Objective:  Vitals:   07/02/16 0710 07/02/16 0712 07/02/16 1026 07/02/16  1302  BP: (!) 88/27 (!) 83/42 (!) 90/50 122/63  Pulse: 79     Resp: (!) 22   16  Temp: 98.2 F (36.8 C)   98 F (36.7 C)  TempSrc: Oral   Oral  SpO2: 95%   98%  Weight:      Height:        Intake/Output Summary (Last 24 hours) at 07/02/16 1603 Last data filed at 07/02/16 1313  Gross per 24 hour  Intake          1710.64 ml  Output              700 ml  Net          1010.64 ml   Filed Weights   07/01/16 0218 07/02/16 0403  Weight: (!) 145.2 kg (320 lb) (!) 153 kg (337 lb 4.9 oz)    Examination:  General exam: Pleasant young female, moderately built and morbidly obese woman seen ambulating comfortably in the halls.  Respiratory system: Clear to auscultation. Respiratory effort normal. Cardiovascular system: S1 & S2 heard, RRR. No JVD, murmurs, rubs, gallops or clicks. No pedal edema.Telemetry: Sinus rhythm with occasional PVCs  Gastrointestinal system: Abdomen is nondistended, soft and nontender. No organomegaly or masses felt. Normal bowel sounds heard. Central nervous system: Alert and oriented. No focal neurological deficits. Extremities: Symmetric 5 x 5 power. Skin: No rashes, lesions or ulcers Psychiatry: Judgement and insight appear normal. Mood & affect appropriate.     Data Reviewed: I have personally reviewed following labs and imaging studies  CBC:  Recent Labs Lab 07/01/16 0241 07/01/16 0910 07/02/16 0229  WBC 12.0* 11.9* 11.4*  NEUTROABS 7.6  --   --   HGB 14.1 14.2 14.1  HCT 40.5 40.1 41.2  MCV 89.4 88.7 89.8  PLT 245 243 0000000   Basic Metabolic Panel:  Recent Labs Lab 07/01/16 0241 07/01/16 0910 07/02/16 0229  NA 139  --  138  K 3.9  --  3.7  CL 103  --  104  CO2 27  --  25  GLUCOSE 169*  --  121*  BUN 13  --  10  CREATININE 0.96 0.84 0.85  CALCIUM 9.1  --  9.1   GFR: Estimated Creatinine Clearance: 140.5 mL/min (by C-G formula based on SCr of 0.85 mg/dL). Liver Function Tests: No results for input(s): AST, ALT, ALKPHOS, BILITOT, PROT,  ALBUMIN in the last 168 hours. No results for input(s): LIPASE, AMYLASE in the last 168 hours. No results for input(s): AMMONIA in the last 168 hours. Coagulation Profile:  Recent Labs Lab 07/01/16 0922  INR 1.01   Cardiac Enzymes:  Recent Labs Lab 07/01/16 0642 07/01/16 0910 07/01/16 1545 07/01/16 1958  TROPONINI 0.05* 0.29* 2.49* 15.91*   BNP (last 3 results) No results for input(s): PROBNP in the last 8760 hours. HbA1C:  Recent Labs  07/01/16 0944  HGBA1C 6.7*   CBG:  Recent Labs Lab 07/01/16 0216  GLUCAP 172*   Lipid Profile:  Recent Labs  07/02/16 1046  CHOL 225*  HDL 28*  LDLCALC UNABLE TO CALCULATE IF TRIGLYCERIDE OVER 400 mg/dL  TRIG 510*  CHOLHDL 8.0   Thyroid Function Tests: No results for input(s): TSH, T4TOTAL, FREET4, T3FREE, THYROIDAB in the last 72 hours. Anemia Panel: No results for input(s): VITAMINB12, FOLATE, FERRITIN, TIBC, IRON, RETICCTPCT in the last 72 hours.  Sepsis Labs: No results for input(s): PROCALCITON, LATICACIDVEN in the last  168 hours.  No results found for this or any previous visit (from the past 240 hour(s)).       Radiology Studies: Dg Chest 2 View  Result Date: 07/01/2016 CLINICAL DATA:  Chest pain tonight. EXAM: CHEST  2 VIEW COMPARISON:  05/12/2016 FINDINGS: Low lung volumes persist. Interstitial prominence is stable. No focal airspace disease. Normal heart size and mediastinal contours. No pleural fluid or pneumothorax. Unchanged osseous structures. IMPRESSION: No acute abnormality. Electronically Signed   By: Jeb Levering M.D.   On: 07/01/2016 02:53        Scheduled Meds: . aspirin EC  81 mg Oral Daily  . atorvastatin  80 mg Oral q1800  . fenofibrate  160 mg Oral Daily  . FLUoxetine  80 mg Oral QHS  . fluticasone furoate-vilanterol  1 puff Inhalation Daily  . irbesartan  75 mg Oral Daily  . levothyroxine  25 mcg Oral QAC breakfast  . living well with diabetes book   Does not apply Once  .  nicotine  14 mg Transdermal Daily  . omega-3 acid ethyl esters  2 g Oral BID  . QUEtiapine  25 mg Oral QHS  . ticagrelor  90 mg Oral BID   Continuous Infusions:   LOS: 1 day     [facility], MD [facility] Pager [phone]  If 7PM-7AM, please contact night-coverage [email or web address] Password [email or web address] 07/02/2016, 4:03 PM

## 2016-07-02 NOTE — Progress Notes (Signed)
Called report to [place] on 3w, pt transferred with all belongings.

## 2016-07-03 DIAGNOSIS — E119 Type 2 diabetes mellitus without complications: Secondary | ICD-10-CM

## 2016-07-03 DIAGNOSIS — E1159 Type 2 diabetes mellitus with other circulatory complications: Secondary | ICD-10-CM

## 2016-07-03 LAB — GLUCOSE, CAPILLARY
Glucose-Capillary: 123 mg/dL — ABNORMAL HIGH (ref 65–99)
Glucose-Capillary: 146 mg/dL — ABNORMAL HIGH (ref 65–99)

## 2016-07-03 MED ORDER — BLOOD GLUCOSE MONITOR KIT: PACK | 0 refills | Status: DC

## 2016-07-03 MED ORDER — IRBESARTAN 75 MG PO TABS: 75.0000 mg | ORAL_TABLET | Freq: Every day | ORAL | 0 refills | Status: DC

## 2016-07-03 MED ORDER — METFORMIN HCL 1000 MG PO TABS: 1000.0000 mg | ORAL_TABLET | Freq: Two times a day (BID) | ORAL | Status: DC

## 2016-07-03 MED ORDER — ATORVASTATIN CALCIUM 80 MG PO TABS: 80.0000 mg | ORAL_TABLET | Freq: Every day | ORAL | 0 refills | Status: DC

## 2016-07-03 MED ORDER — TICAGRELOR 90 MG PO TABS: 90.0000 mg | ORAL_TABLET | Freq: Two times a day (BID) | ORAL | 0 refills | Status: DC

## 2016-07-03 MED ORDER — NICOTINE 14 MG/24HR TD PT24: 14.0000 mg | MEDICATED_PATCH | Freq: Every day | TRANSDERMAL | 0 refills | Status: DC

## 2016-07-03 MED ORDER — ASPIRIN 81 MG PO TBEC: 81.0000 mg | DELAYED_RELEASE_TABLET | Freq: Every day | ORAL | 0 refills | Status: DC

## 2016-07-03 NOTE — Discharge Instructions (Signed)
Blood Glucose Monitoring, Adult Monitoring your blood sugar (glucose) helps you manage your diabetes. It also helps you and your health care provider determine how well your diabetes management plan is working. Blood glucose monitoring involves checking your blood glucose as often as directed, and keeping a record (log) of your results over time. Why should I monitor my blood glucose? Checking your blood glucose regularly can:  Help you understand how food, exercise, illnesses, and medicines affect your blood glucose.  Let you know what your blood glucose is at any time. You can quickly tell if you are having low blood glucose (hypoglycemia) or high blood glucose (hyperglycemia).  Help you and your health care provider adjust your medicines as needed. When should I check my blood glucose? Follow instructions from your health care provider about how often to check your blood glucose. This may depend on:  The type of diabetes you have.  How well-controlled your diabetes is.  Medicines you are taking. If you have type 1 diabetes:  Check your blood glucose at least 2 times a day.  Also check your blood glucose:  Before every insulin injection.  Before and after exercise.  Between meals.  2 hours after a meal.  Occasionally between 2:00 a.m. and 3:00 a.m., as directed.  Before potentially dangerous tasks, like driving or using heavy machinery.  At bedtime.  You may need to check your blood glucose more often, up to 6-10 times a day:  If you use an insulin pump.  If you need multiple daily injections (MDI).  If your diabetes is not well-controlled.  If you are ill.  If you have a history of severe hypoglycemia.  If you have a history of not knowing when your blood glucose is getting low (hypoglycemia unawareness). If you have type 2 diabetes:  If you take insulin or other diabetes medicines, check your blood glucose at least 2 times a day.  If you are on intensive  insulin therapy, check your blood glucose at least 4 times a day. Occasionally, you may also need to check between 2:00 a.m. and 3:00 a.m., as directed.  Also check your blood glucose:  Before and after exercise.  Before potentially dangerous tasks, like driving or using heavy machinery.  You may need to check your blood glucose more often if:  Your medicine is being adjusted.  Your diabetes is not well-controlled.  You are ill. What is a blood glucose log?  A blood glucose log is a record of your blood glucose readings. It helps you and your health care provider:  Look for patterns in your blood glucose over time.  Adjust your diabetes management plan as needed.  Every time you check your blood glucose, write down your result and notes about things that may be affecting your blood glucose, such as your diet and exercise for the day.  Most glucose meters store a record of glucose readings in the meter. Some meters allow you to download your records to a computer. How do I check my blood glucose? Follow these steps to get accurate readings of your blood glucose: Supplies needed   Blood glucose meter.  Test strips for your meter. Each meter has its own strips. You must use the strips that come with your meter.  A needle to prick your finger (lancet). Do not use lancets more than once.  A device that holds the lancet (lancing device).  A journal or log book to write down your results. Procedure  Wash your  hands with soap and water.  Prick the side of your finger (not the tip) with the lancet. Use a different finger each time.  Gently rub the finger until a small drop of blood appears.  Follow instructions that come with your meter for inserting the test strip, applying blood to the strip, and using your blood glucose meter.  Write down your result and any notes. Alternative testing sites  Some meters allow you to use areas of your body other than your finger  (alternative sites) to test your blood.  If you think you may have hypoglycemia, or if you have hypoglycemia unawareness, do not use alternative sites. Use your finger instead.  Alternative sites may not be as accurate as the fingers, because blood flow is slower in these areas. This means that the result you get may be delayed, and it may be different from the result that you would get from your finger.  The most common alternative sites are:  Forearm.  Thigh.  Palm of the hand. Additional tips  Always keep your supplies with you.  If you have questions or need help, all blood glucose meters have a 24-hour hotline number that you can call. You may also contact your health care provider.  After you use a few boxes of test strips, adjust (calibrate) your blood glucose meter by following instructions that came with your meter. This information is not intended to replace advice given to you by your health care provider. Make sure you discuss any questions you have with your health care provider. Document Released: 06/23/2003 Document Revised: 01/08/2016 Document Reviewed: 11/30/2015 Elsevier Interactive Patient Education  2017 [place] Introduction Refer to this sheet in the next few weeks. These instructions provide you with information about caring for yourself after your procedure. Your health care provider may also give you more specific instructions. Your treatment has been planned according to current medical practices, but problems sometimes occur. Call your health care provider if you have any problems or questions after your procedure. What can I expect after the procedure? After your procedure, it is typical to have the following:  Bruising at the radial site that usually fades within 1-2 weeks.  Blood collecting in the tissue (hematoma) that may be painful to the touch. It should usually decrease in size and tenderness within 1-2 weeks. Follow  these instructions at home:  Take medicines only as directed by your health care provider.  You may shower 24-48 hours after the procedure or as directed by your health care provider. Remove the bandage (dressing) and gently wash the site with plain soap and water. Pat the area dry with a clean towel. Do not rub the site, because this may cause bleeding.  Do not take baths, swim, or use a hot tub until your health care provider approves.  Check your insertion site every day for redness, swelling, or drainage.  Do not apply powder or lotion to the site.  Do not flex or bend the affected arm for 24 hours or as directed by your health care provider.  Do not push or pull heavy objects with the affected arm for 24 hours or as directed by your health care provider.  Do not lift over 10 lb (4.5 kg) for 5 days after your procedure or as directed by your health care provider.  Ask your health care provider when it is okay to:  Return to work or school.  Resume usual physical activities or  sports.  Resume sexual activity.  Do not drive home if you are discharged the same day as the procedure. Have someone else drive you.  You may drive 24 hours after the procedure unless otherwise instructed by your health care provider.  Do not operate machinery or power tools for 24 hours after the procedure.  If your procedure was done as an outpatient procedure, which means that you went home the same day as your procedure, a responsible adult should be with you for the first 24 hours after you arrive home.  Keep all follow-up visits as directed by your health care provider. This is important. Contact a health care provider if:  You have a fever.  You have chills.  You have increased bleeding from the radial site. Hold pressure on the site. Get help right away if:  You have unusual pain at the radial site.  You have redness, warmth, or swelling at the radial site.  You have drainage (other  than a small amount of blood on the dressing) from the radial site.  The radial site is bleeding, and the bleeding does not stop after 30 minutes of holding steady pressure on the site.  Your arm or hand becomes pale, cool, tingly, or numb. This information is not intended to replace advice given to you by your health care provider. Make sure you discuss any questions you have with your health care provider. Document Released: 07/23/2010 Document Revised: 11/26/2015 Document Reviewed: 01/06/2014  2017 Elsevier Diabetes Mellitus and Food It is important for you to manage your blood sugar (glucose) level. Your blood glucose level can be greatly affected by what you eat. Eating healthier foods in the appropriate amounts throughout the day at about the same time each day will help you control your blood glucose level. It can also help slow or prevent worsening of your diabetes mellitus. Healthy eating may even help you improve the level of your blood pressure and reach or maintain a healthy weight. General recommendations for healthful eating and cooking habits include:  Eating meals and snacks regularly. Avoid going long periods of time without eating to lose weight.  Eating a diet that consists mainly of plant-based foods, such as fruits, vegetables, nuts, legumes, and whole grains.  Using low-heat cooking methods, such as baking, instead of high-heat cooking methods, such as deep frying. Work with your dietitian to make sure you understand how to use the Nutrition Facts information on food labels. How can food affect me? Carbohydrates  Carbohydrates affect your blood glucose level more than any other type of food. Your dietitian will help you determine how many carbohydrates to eat at each meal and teach you how to count carbohydrates. Counting carbohydrates is important to keep your blood glucose at a healthy level, especially if you are using insulin or taking certain medicines for diabetes  mellitus. Alcohol  Alcohol can cause sudden decreases in blood glucose (hypoglycemia), especially if you use insulin or take certain medicines for diabetes mellitus. Hypoglycemia can be a life-threatening condition. Symptoms of hypoglycemia (sleepiness, dizziness, and disorientation) are similar to symptoms of having too much alcohol. If your health care provider has given you approval to drink alcohol, do so in moderation and use the following guidelines:  Women should not have more than one drink per day, and men should not have more than two drinks per day. One drink is equal to:  12 oz of beer.  5 oz of wine.  1 oz of hard liquor.  Do not drink on an empty stomach.  Keep yourself hydrated. Have water, diet soda, or unsweetened iced tea.  Regular soda, juice, and other mixers might contain a lot of carbohydrates and should be counted. What foods are not recommended? As you make food choices, it is important to remember that all foods are not the same. Some foods have fewer nutrients per serving than other foods, even though they might have the same number of calories or carbohydrates. It is difficult to get your body what it needs when you eat foods with fewer nutrients. Examples of foods that you should avoid that are high in calories and carbohydrates but low in nutrients include:  Trans fats (most processed foods list trans fats on the Nutrition Facts label).  Regular soda.  Juice.  Candy.  Sweets, such as cake, pie, doughnuts, and cookies.  Fried foods. What foods can I eat? Eat nutrient-rich foods, which will nourish your body and keep you healthy. The food you should eat also will depend on several factors, including:  The calories you need.  The medicines you take.  Your weight.  Your blood glucose level.  Your blood pressure level.  Your cholesterol level. You should eat a variety of foods, including:  Protein.  Lean cuts of meat.  Proteins low in  saturated fats, such as fish, egg whites, and beans. Avoid processed meats.  Fruits and vegetables.  Fruits and vegetables that may help control blood glucose levels, such as apples, mangoes, and yams.  Dairy products.  Choose fat-free or low-fat dairy products, such as milk, yogurt, and cheese.  Grains, bread, pasta, and rice.  Choose whole grain products, such as multigrain bread, whole oats, and brown rice. These foods may help control blood pressure.  Fats.  Foods containing healthful fats, such as nuts, avocado, olive oil, canola oil, and fish. Does everyone with diabetes mellitus have the same meal plan? Because every person with diabetes mellitus is different, there is not one meal plan that works for everyone. It is very important that you meet with a dietitian who will help you create a meal plan that is just right for you. This information is not intended to replace advice given to you by your health care provider. Make sure you discuss any questions you have with your health care provider. Document Released: 03/17/2005 Document Revised: 11/26/2015 Document Reviewed: 05/17/2013 Elsevier Interactive Patient Education  2017 [place].    Acute Coronary Syndrome Acute coronary syndrome (ACS) is a serious problem in which there is suddenly not enough blood and oxygen supplied to the heart. ACS may mean that one or more of the blood vessels in your heart (coronary arteries) may be blocked. ACS can result in chest pain or a heart attack (myocardial infarction or MI). What are the causes? This condition is caused by atherosclerosis, which is the buildup of fat and cholesterol (plaque) on the inside of the arteries. Over time, the plaque may narrow or block the artery, and this will lessen blood flow to the heart. Plaque can also become weak and break off within a coronary artery to form a clot and cause a sudden blockage. What increases the risk? The risk factors of this  condition include:  High cholesterol levels.  High blood pressure (hypertension).  Smoking.  Diabetes.  Age.  Family history of chest pain, heart disease, or stroke.  Lack of exercise. What are the signs or symptoms? The most common signs of this condition include:  Chest pain,  which can be:  A crushing or squeezing in the chest.  A tightness, pressure, fullness, or heaviness in the chest.  Present for more than a few minutes, or it can stop and recur.  Pain in the arms, neck, jaw, or back.  Unexplained heartburn or indigestion.  Shortness of breath.  Nausea.  Sudden cold sweats.  Feeling light-headed or dizzy. Sometimes, this condition has no symptoms. How is this diagnosed? ACS may be diagnosed through the following tests:  Electrocardiogram (ECG).  Blood tests.  Coronary angiogram. This is a procedure to look at the coronary arteries to see if there is any blockage. How is this treated? Treatment for ACS may include:  Healthy behavioral changes to reduce or control risk factors.  Medicine.  Coronary stenting.A stent helps to keep an artery open.  Coronary angioplasty. This procedure widens a narrowed or blocked artery.  Coronary artery bypass surgery. This will allow your blood to pass the blockage (bypass) to reach your heart. Follow these instructions at home: Eating and drinking  Follow a heart-healthy diet. A dietitian can you help to educate you about healthy food options and changes.  Use healthy cooking methods such as roasting, grilling, broiling, baking, poaching, steaming, or stir-frying. Talk to a dietitian to learn more about healthy cooking methods. Medicines  Take medicines only as directed by your health care provider.  Do not take the following medicines unless your health care provider approves:  Nonsteroidal anti-inflammatory drugs (NSAIDs), such as ibuprofen, naproxen, or celecoxib.  Vitamin supplements that contain  vitamin A, vitamin E, or both.  Hormone replacement therapy that contains estrogen with or without progestin.  Stop illegal drug use. Activity  Follow an exercise program that is approved by your health care provider.  Plan rest periods when you are fatigued. Lifestyle  Do not use any tobacco products, including cigarettes, chewing tobacco, or electronic cigarettes. If you need help quitting, ask your health care provider.  If you drink alcohol, and your health care provider approves, limit your alcohol intake to no more than 1 drink per day. One drink equals 12 ounces of beer, 5 ounces of wine, or 1 ounces of hard liquor.  Learn to manage stress.  Maintain a healthy weight. Lose weight as approved by your health care provider. General instructions  Manage other health conditions, such as hypertension and diabetes, as directed by your health care provider.  Keep all follow-up visits as directed by your health care provider. This is important.  Your health care provider may ask you to monitor your blood pressure. A blood pressure reading consists of a higher number over a lower number, such as 110 over 72, written as 110/72. Ideally, your blood pressure should be:  Below 140/90 if you have no other medical conditions.  Below 130/80 if you have diabetes or kidney disease. Get help right away if:  You have pain in your chest, neck, arm, jaw, stomach, or back that lasts more than a few minutes, is recurring, or is not relieved by taking medicine under your tongue (sublingual nitroglycerin).  You have profuse sweating without cause.  You have unexplained:  Heartburn or indigestion.  Shortness of breath or difficulty breathing.  Nausea or vomiting.  Fatigue.  Feelings of nervousness or anxiety.  Weakness.  Diarrhea.  You have sudden light-headedness or dizziness.  You faint. These symptoms may represent a serious problem that is an emergency. Do not wait to see if  the symptoms will go away. Get medical help right  away. Call your local emergency services (911 in the [place] Do not drive yourself to the clinic or hospital.  This information is not intended to replace advice given to you by your health care provider. Make sure you discuss any questions you have with your health care provider. Document Released: 06/20/2005 Document Revised: 12/02/2015 Document Reviewed: 10/22/2013 Elsevier Interactive Patient Education  2017 [employer].

## 2016-07-03 NOTE — Discharge Summary (Signed)
Physician Discharge Summary  Kaitlyn Chambers [id number] DOB: 06/28/1981  PCP: Elyn Peers, MD  Admit date: 07/01/2016 Discharge date: 07/03/2016  Recommendations for Outpatient Follow-up:  1. Dr. Lucianne Lei, PCP in 5 days with repeat labs (CBC & BMP) 2. Dr. Candee Furbish, Cardiology: MDs office will call patient to arrange follow-up.  Home Health: None Equipment/Devices: None    Discharge Condition: Improved and stable  CODE STATUS: Full  Diet recommendation: Heart healthy and diabetic diet.  Discharge Diagnoses:  Principal Problem:   Chest pain Active Problems:   Morbid obesity (HCC)   PCOS (polycystic ovarian syndrome)   Hypertension   NSTEMI (non-ST elevated myocardial infarction) ([place])   Diabetes mellitus ([place])   Brief/Interim Summary: 35 year old female with PMH of morbid obesity, PCOS/metabolic syndrome on metformin (denied history of DM prior to admission), HTN, bipolar disorder, tobacco & THC abuse presented to [facility] ED on 07/01/16 with substernal chest pain radiating to left side of her jaw at around 2 PM on 12/28. She continued to rest but had intermittent chest pain on the afternoon. Her pain waxed and waned in severity and associated with nausea and vomiting. EMS was called and was given sublingual NTG which relieved her pain and she did not want to be transported to the hospital. She then had worsening chest pain that awakened her from sleep and she called 911 again. Family history of MI in mother at age 55 who had stenting. Cardiology assessed her with NSTEMI and she underwent cardiac cath and DES to circumflex on 12/29.   Assessment & Plan:   1. NSTEMI: Patient presented with chest pain with both typical and atypical features. Prior to cath troponin peaked at 2.49 which probably increased to 15.91 after cath. Given her multiple significant risk factors (obesity, typical and atypical symptoms, tobacco abuse, family history, metabolic syndrome) she underwent  Cardiac catheterization and DES to circumflex. No intervention to chronically occluded RCA with collateral blood flow. Preserved LV function on catheterization and by echo. Cardiac rehabilitation. Continue the DAPT (aspirin and Brilinta) for one year. Tobacco cessation counseled. Cardiology have seen her today and cleared her for discharge. No beta blockers added due to hypotension yesterday and allergy (she says rash and turned colors). 2. Uncontrolled hypertension: Blood pressures low overnight 12/29 and morning of 12/30. Amlodipine and prazosin were discontinued. ARB changed to Irbesarten 75 mg daily hypotension resolved. Blood pressures controlled.. 3. Morbid obesity/Body mass index is 52.83 kg/m.: Eventually will need diet, exercise and weight loss. 4. Tobacco & THC abuse: Cessation counseled. 5. Bipolar disorder/anxiety: Continue home medications. 6. New type II DM: Hemoglobin A1c 6.7 on 07/01/16. Continue metformin 1 g twice a day as outpatient from 07/04/16. Diabetes coordinator consultation appreciated. Patient has been counseled. 7. PCOS: Continue metformin as outpatient. 8. Hypothyroid: Continue Synthroid. 9. Hyperlipidemia: Continue atorvastatin, fenofibrate and omega-3    Consultants:   Cardiology   Procedures:   Cardiac catheterization 07/01/16: Narrative & Impression      The left ventricular ejection fraction is 50-55% by visual estimate.  The left ventricular systolic function is normal.  LV end diastolic pressure is normal.  There is no mitral valve regurgitation.  Mid RCA lesion, 100 %stenosed.  A STENT SYNERGY DES 3X20 drug eluting stent was successfully placed.  Prox Cx to Mid Cx lesion, 100 %stenosed.  Post intervention, there is a 0% residual stenosis.  1. Acute lateral MI secondary to occluded Circumflex (NSTEMI) 2. Chronic occlusion proximal RCA with filling of distal vessel from left to right  collaterals.  3. Preserved LV systolic function 4.  Successful PTCA/DES x 1 mid Circumflex.  Recommendations: DAPT for one year with ASA/Brilinta. Continue statin. Beta blocker as tolerated      Discharge Instructions  Discharge Instructions    Amb Referral to Cardiac Rehabilitation    Complete by:  As directed    Diagnosis:   NSTEMI Comment - DES Stent to Cfx PTCA     Ambulatory referral to Nutrition and Diabetic Education    Complete by:  As directed    A1C 6.7%; need follow up education on DM   Call MD for:  difficulty breathing, headache or visual disturbances    Complete by:  As directed    Call MD for:  extreme fatigue    Complete by:  As directed    Call MD for:  persistant dizziness or light-headedness    Complete by:  As directed    Call MD for:  redness, tenderness, or signs of infection (pain, swelling, redness, odor or green/yellow discharge around incision site)    Complete by:  As directed    Call MD for:  severe uncontrolled pain    Complete by:  As directed    Call MD for:  temperature >100.4    Complete by:  As directed    Diet - low sodium heart healthy    Complete by:  As directed    Diet Carb Modified    Complete by:  As directed    Increase activity slowly    Complete by:  As directed        Medication List    STOP taking these medications   ibuprofen 200 MG tablet Commonly known as:  ADVIL,MOTRIN   prazosin 5 MG capsule Commonly known as:  MINIPRESS   valsartan 320 MG tablet Commonly known as:  DIOVAN Replaced by:  irbesartan 75 MG tablet     TAKE these medications   albuterol 108 (90 Base) MCG/ACT inhaler Commonly known as:  PROVENTIL HFA;VENTOLIN HFA Inhale 2 puffs into the lungs every 6 (six) hours as needed for wheezing or shortness of breath.   aspirin 81 MG EC tablet Take 1 tablet (81 mg total) by mouth daily. Start taking on:  07/04/2016   atorvastatin 80 MG tablet Commonly known as:  LIPITOR Take 1 tablet (80 mg total) by mouth daily at 6 PM.   BREO ELLIPTA 100-25 MCG/INH  Aepb Generic drug:  fluticasone furoate-vilanterol Inhale 1 puff into the lungs daily.   clonazePAM 1 MG tablet Commonly known as:  KLONOPIN Take 1 mg by mouth 3 (three) times daily.   CORICIDIN 2-325 MG Tabs Generic drug:  Chlorpheniramine-APAP Take 2 tablets by mouth every 4 (four) hours as needed (For cold symptoms.).   fenofibrate 145 MG tablet Commonly known as:  TRICOR Take 145 mg by mouth daily.   FLUoxetine 40 MG capsule Commonly known as:  PROZAC Take 80 mg by mouth at bedtime.   irbesartan 75 MG tablet Commonly known as:  AVAPRO Take 1 tablet (75 mg total) by mouth daily. Start taking on:  07/04/2016 Replaces:  valsartan 320 MG tablet   levothyroxine 25 MCG tablet Commonly known as:  SYNTHROID, LEVOTHROID Take 25 mcg by mouth daily before breakfast.   metFORMIN 1000 MG tablet Commonly known as:  GLUCOPHAGE Take 1 tablet (1,000 mg total) by mouth 2 (two) times daily with a meal. Held after heart cath. May start back from 07/04/2016. Start taking on:  07/04/2016 What changed:  additional  instructions   multivitamin with minerals tablet Take 1 tablet by mouth daily.   nicotine 14 mg/24hr patch Commonly known as:  NICODERM CQ - dosed in mg/24 hours Place 1 patch (14 mg total) onto the skin daily. Start taking on:  07/04/2016   QUEtiapine 25 MG tablet Commonly known as:  SEROQUEL Take 25 mg by mouth at bedtime.   ticagrelor 90 MG Tabs tablet Commonly known as:  BRILINTA Take 1 tablet (90 mg total) by mouth 2 (two) times daily.   VASCEPA 1 g Caps Generic drug:  Icosapent Ethyl Take 2 g by mouth 2 (two) times daily.      Follow-up Information    Elyn Peers, MD. Schedule an appointment as soon as possible for a visit in 5 day(s).   Specialty:  Family Medicine Why:  To be seen with repeat labs (CBC & BMP). Contact information: [address] [place] [address] [phone]        Candee Furbish, MD Follow up.   Specialty:  Cardiology Why:  MD's  office will call you with follow up appointment. Contact information: [id number] [place] [address] [phone]          Allergies  Allergen Reactions  . Levaquin [Levofloxacin] Rash  . Metoprolol Nausea And Vomiting and Other (See Comments)    Sweats and dizziness     Procedures/Studies: Dg Chest 2 View  Result Date: 07/01/2016 CLINICAL DATA:  Chest pain tonight. EXAM: CHEST  2 VIEW COMPARISON:  05/12/2016 FINDINGS: Low lung volumes persist. Interstitial prominence is stable. No focal airspace disease. Normal heart size and mediastinal contours. No pleural fluid or pneumothorax. Unchanged osseous structures. IMPRESSION: No acute abnormality. Electronically Signed   By: Jeb Levering M.D.   On: 07/01/2016 02:53      Subjective: Seen this morning. Anxious to go home. States that she has ambulated the halls multiple times without chest pain, dyspnea, palpitations, dizziness or lightheadedness.  Discharge Exam:  Vitals:   07/02/16 1026 07/02/16 1302 07/02/16 2221 07/03/16 0504  BP: (!) 90/50 122/63 140/83 117/64  Pulse:   75 73  Resp:  16 20   Temp:  98 F (36.7 C) 98 F (36.7 C) 97.5 F (36.4 C)  TempSrc:  Oral Oral Axillary  SpO2:  98% 97% 97%  Weight:      Height:        General exam: Pleasant young female, moderately built and morbidly obese woman seen ambulating comfortably in the room.  Respiratory system: Clear to auscultation. Respiratory effort normal. Cardiovascular system: S1 & S2 heard, RRR. No JVD, murmurs, rubs, gallops or clicks. No pedal edema.Telemetry: Sinus rhythm with occasional PVCs  Gastrointestinal system: Abdomen is nondistended, soft and nontender. No organomegaly or masses felt. Normal bowel sounds heard. Central nervous system: Alert and oriented. No focal neurological deficits. Extremities: Symmetric 5 x 5 power. Skin: No rashes, lesions or ulcers Psychiatry: Judgement and insight appear normal. Mood & affect  appropriate.     The results of significant diagnostics from this hospitalization (including imaging, microbiology, ancillary and laboratory) are listed below for reference.     Microbiology: No results found for this or any previous visit (from the past 240 hour(s)).   Labs: BNP (last 3 results) No results for input(s): BNP in the last 8760 hours. Basic Metabolic Panel:  Recent Labs Lab 07/01/16 0241 07/01/16 0910 07/02/16 0229  NA 139  --  138  K 3.9  --  3.7  CL 103  --  104  CO2 27  --  25  GLUCOSE 169*  --  121*  BUN 13  --  10  CREATININE 0.96 0.84 0.85  CALCIUM 9.1  --  9.1   Liver Function Tests: No results for input(s): AST, ALT, ALKPHOS, BILITOT, PROT, ALBUMIN in the last 168 hours. No results for input(s): LIPASE, AMYLASE in the last 168 hours. No results for input(s): AMMONIA in the last 168 hours. CBC:  Recent Labs Lab 07/01/16 0241 07/01/16 0910 07/02/16 0229  WBC 12.0* 11.9* 11.4*  NEUTROABS 7.6  --   --   HGB 14.1 14.2 14.1  HCT 40.5 40.1 41.2  MCV 89.4 88.7 89.8  PLT 245 243 233   Cardiac Enzymes:  Recent Labs Lab 07/01/16 0642 07/01/16 0910 07/01/16 1545 07/01/16 1958  TROPONINI 0.05* 0.29* 2.49* 15.91*   CBG:  Recent Labs Lab 07/01/16 0216 07/02/16 1659 07/02/16 2219 07/03/16 0740 07/03/16 1109  GLUCAP 172* 153* 134* 123* 146*   Hgb A1c  Recent Labs  07/01/16 0944  HGBA1C 6.7*   Lipid Profile  Recent Labs  07/02/16 1046  CHOL 225*  HDL 28*  LDLCALC UNABLE TO CALCULATE IF TRIGLYCERIDE OVER 400 mg/dL  TRIG 510*  CHOLHDL 8.0      Time coordinating discharge: Over 30 minutes  SIGNED:  Vernell Leep, MD, FACP, FHM. [facility] Pager [phone]  If 7PM-7AM, please contact night-coverage [email or web address] Password [email or web address] 07/03/2016, 11:40 AM

## 2016-07-03 NOTE — Progress Notes (Signed)
Patient Name: Kaitlyn Chambers Date of Encounter: 07/03/2016  Primary Cardiologist: [facility] Problem List     Principal Problem:   Chest pain Active Problems:   Morbid obesity (HCC)   PCOS (polycystic ovarian syndrome)   Hypertension   NSTEMI (non-ST elevated myocardial infarction) ([place])     Subjective   She is feeling much better after catheterization. "A weight was lifted off my chest ".  Ready to go. Uneventful day. Ambulating well.  Inpatient Medications    Scheduled Meds: . aspirin EC  81 mg Oral Daily  . atorvastatin  80 mg Oral q1800  . clonazePAM  1 mg Oral TID  . fenofibrate  160 mg Oral Daily  . FLUoxetine  80 mg Oral QHS  . fluticasone furoate-vilanterol  1 puff Inhalation Daily  . insulin aspart  0-5 Units Subcutaneous QHS  . insulin aspart  0-9 Units Subcutaneous TID WC  . irbesartan  75 mg Oral Daily  . levothyroxine  25 mcg Oral QAC breakfast  . nicotine  14 mg Transdermal Daily  . omega-3 acid ethyl esters  2 g Oral BID  . QUEtiapine  25 mg Oral QHS  . ticagrelor  90 mg Oral BID   Continuous Infusions:  PRN Meds: acetaminophen **OR** acetaminophen, albuterol, bisacodyl, gi cocktail, hydrALAZINE, morphine injection, nitroGLYCERIN, ondansetron (ZOFRAN) IV, polyethylene glycol, traZODone   Vital Signs    Vitals:   07/02/16 1026 07/02/16 1302 07/02/16 2221 07/03/16 0504  BP: (!) 90/50 122/63 140/83 117/64  Pulse:   75 73  Resp:  16 20   Temp:  98 F (36.7 C) 98 F (36.7 C) 97.5 F (36.4 C)  TempSrc:  Oral Oral Axillary  SpO2:  98% 97% 97%  Weight:      Height:        Intake/Output Summary (Last 24 hours) at 07/03/16 0811 Last data filed at 07/02/16 1737  Gross per 24 hour  Intake             1200 ml  Output              700 ml  Net              500 ml   Filed Weights   07/01/16 0218 07/02/16 0403  Weight: (!) 320 lb (145.2 kg) (!) 337 lb 4.9 oz (153 kg)    Physical Exam    GEN: Well nourished, well developed, in no  acute distress. Obese HEENT: Grossly normal.  Neck: Supple, no JVD, carotid bruits, or masses. Cardiac: RRR, no murmurs, rubs, or gallops. No clubbing, cyanosis, edema.  Radials/DP/PT 2+ and equal bilaterally.  Respiratory:  Respirations regular and unlabored, clear to auscultation bilaterally. GI: Soft, nontender, nondistended, BS + x 4. MS: no deformity or atrophy. Skin: warm and dry, no rash. Tattoos, radial site appears normal. Neuro:  Strength and sensation are intact. Psych: AAOx3.  Normal affect.  Labs    CBC  Recent Labs  07/01/16 0241 07/01/16 0910 07/02/16 0229  WBC 12.0* 11.9* 11.4*  NEUTROABS 7.6  --   --   HGB 14.1 14.2 14.1  HCT 40.5 40.1 41.2  MCV 89.4 88.7 89.8  PLT 245 243 0000000   Basic Metabolic Panel  Recent Labs  07/01/16 0241 07/01/16 0910 07/02/16 0229  NA 139  --  138  K 3.9  --  3.7  CL 103  --  104  CO2 27  --  25  GLUCOSE 169*  --  121*  BUN 13  --  10  CREATININE 0.96 0.84 0.85  CALCIUM 9.1  --  9.1   Liver Function Tests No results for input(s): AST, ALT, ALKPHOS, BILITOT, PROT, ALBUMIN in the last 72 hours. No results for input(s): LIPASE, AMYLASE in the last 72 hours. Cardiac Enzymes  Recent Labs  07/01/16 0910 07/01/16 1545 07/01/16 1958  TROPONINI 0.29* 2.49* 15.91*   BNP Invalid input(s): POCBNP D-Dimer No results for input(s): DDIMER in the last 72 hours. Hemoglobin A1C  Recent Labs  07/01/16 0944  HGBA1C 6.7*   Fasting Lipid Panel  Recent Labs  07/02/16 1046  CHOL 225*  HDL 28*  LDLCALC UNABLE TO CALCULATE IF TRIGLYCERIDE OVER 400 mg/dL  TRIG 510*  CHOLHDL 8.0   Thyroid Function Tests No results for input(s): TSH, T4TOTAL, T3FREE, THYROIDAB in the last 72 hours.  Invalid input(s): FREET3  Telemetry    Isolated couplet noted, no adverse arrhythmias, sinus rhythm - Personally Reviewed  ECG    Original EKG with normal sinus rhythm- Personally Reviewed  Radiology    No results found.  Cardiac  Studies   Cardiac catheterization: 07/01/16 Burnell Blanks, MD 07/01/2016 Routine  Narrative & Impression      The left ventricular ejection fraction is 50-55% by visual estimate.  The left ventricular systolic function is normal.  LV end diastolic pressure is normal.  There is no mitral valve regurgitation.  Mid RCA lesion, 100 %stenosed.  A STENT SYNERGY DES 3X20 drug eluting stent was successfully placed.  Prox Cx to Mid Cx lesion, 100 %stenosed.  Post intervention, there is a 0% residual stenosis.   1. Acute lateral MI secondary to occluded Circumflex (NSTEMI) 2. Chronic occlusion proximal RCA with filling of distal vessel from left to right collaterals.  3. Preserved LV systolic function 4. Successful PTCA/DES x 1 mid Circumflex.  Recommendations: DAPT for one year with ASA/Brilinta. Continue statin. Beta blocker as tolerated.         Patient Profile     35 year old female with non-ST elevation myocardial infarction with cardiac catheterization demonstrated occluded right coronary artery with collateral blood flow, occluded circumflex artery with successful drug eluting stent placement, morbid obesity, marijuana use, diabetes, hypertension  Assessment & Plan    Non-ST elevation myocardial infarction  - DES to circumflex. No intervention to chronically occluded right coronary with collateral blood flow. Preserved LV function on catheterization.  - Cardiac rehabilitation  - No adverse arrhythmias overnight, couplet only  - Blood pressure improved. I have discontinued her amlodipine 5 mg and prazosin that she takes before bedtime and have cut back her irbesartan from 300 mg once a day down to 75 mg once a day.  - Continue with dual antiplatelet therapy, aspirin, Brilinta for one year.  - I did not add beta blocker because of hypotension and allergy (she says rash, turned colors)  - Encourage tobacco cessation, marijuana cessation  discharge   Morbid  obesity  - Continue to encourage weight loss  Diabetes  - Per primary team, weight loss  Essential hypertension  - Medication changes made as above.  OK for dc  We will set up follow up in 1-2 weeks   Signed, Candee Furbish, MD  07/03/2016, 8:11 AM

## 2016-07-05 ENCOUNTER — Encounter (HOSPITAL_COMMUNITY): Payer: Self-pay | Admitting: Cardiovascular Disease

## 2016-07-07 DIAGNOSIS — Z79899 Other long term (current) drug therapy: Secondary | ICD-10-CM | POA: Diagnosis not present

## 2016-07-07 DIAGNOSIS — R69 Illness, unspecified: Secondary | ICD-10-CM | POA: Diagnosis not present

## 2016-07-07 DIAGNOSIS — E038 Other specified hypothyroidism: Secondary | ICD-10-CM | POA: Diagnosis not present

## 2016-07-07 DIAGNOSIS — E0869 Diabetes mellitus due to underlying condition with other specified complication: Secondary | ICD-10-CM | POA: Diagnosis not present

## 2016-07-07 DIAGNOSIS — E6609 Other obesity due to excess calories: Secondary | ICD-10-CM | POA: Diagnosis not present

## 2016-07-07 DIAGNOSIS — G4730 Sleep apnea, unspecified: Secondary | ICD-10-CM | POA: Diagnosis not present

## 2016-07-07 DIAGNOSIS — J441 Chronic obstructive pulmonary disease with (acute) exacerbation: Secondary | ICD-10-CM | POA: Diagnosis not present

## 2016-07-12 DIAGNOSIS — J449 Chronic obstructive pulmonary disease, unspecified: Secondary | ICD-10-CM | POA: Diagnosis not present

## 2016-07-18 NOTE — Progress Notes (Signed)
CARDIOLOGY OFFICE NOTE  Date:  07/19/2016    Gerald Stabs Date of Birth: 07-16-1980 Medical Record #[id number]  PCP:  Elyn Peers, MD  Cardiologist:  [facility]  Chief Complaint  Patient presents with  . Coronary Artery Disease    Post hospital visit - seen for Dr. [name]    History of Present Illness: Kaitlyn Chambers is a 36 y.o. female who presents today for a post hospital visit. Seen for Dr. [name].   She has a PMH of morbid obesity, PCOS/metabolic syndrome on metformin (denied history of DM prior to admission), HTN, bipolar disorder, COPD, tobacco &THC abuse.   She presented to [facility] ED on 07/01/16 with substernal chest pain.   EMS was subsequently called and was given sublingual NTG which relieved her pain and she did not want to be transported to the hospital. She then had worsening chest pain that awakened her from sleep and she called 911 again. Family history of MI in mother at age 41 who had stenting. Cardiology assessed her with NSTEMIand she underwent cardiac cath and DESto circumflex on 07/01/16.  Comes in today. Here alone. She has been home about 2 weeks - has basically done ok. Has had some chest pain the last day or so - described as a pressure sensation - no NTG use. She felt it was more related to anxiety/panic attack. Not quite like her initial presentation. She is smoking less. No marijuana. Breathing is ok. She is getting therapy for her psyche issues. She kicked her husband out while hospitalized. She has 3 boys she is caring for. Has been out of her Fenofibrate for some time. Was on Lasix and potassium prior to this event and would like to resume. She feels "like I'm holding onto fluid". Chest feels tight. Generalized edema. No problem with her cath site.   Past Medical History:  Diagnosis Date  . Anxiety   . Bipolar 1 disorder, manic, full remission ([place])   . CHF (congestive heart failure) ([place])   . COPD (chronic obstructive pulmonary disease)  ([place])   . Coronary artery disease   . Depression   . GERD (gastroesophageal reflux disease)   . Gout   . High cholesterol   . Hypertension   . Hypothyroidism   . Metabolic syndrome   . NSTEMI (non-ST elevated myocardial infarction) ([place]) 07/01/2016  . On home oxygen therapy    "available; don't use it" (07/01/2016)  . PCOS (polycystic ovarian syndrome)   . Stomach ulcer   . Type 2 diabetes, diet controlled ([place])     Past Surgical History:  Procedure Laterality Date  . CARDIAC CATHETERIZATION N/A 07/01/2016   Procedure: LEFT HEART CATH AND CORONARY ANGIOGRAPHY;  Surgeon: Burnell Blanks, MD;  Location: [place] [facility];  Service: Cardiovascular;  Laterality: N/A;  . CARDIAC CATHETERIZATION N/A 07/01/2016   Procedure: Coronary Stent Intervention;  Surgeon: Burnell Blanks, MD;  Location: [place] [facility];  Service: Cardiovascular;  Laterality: N/A;  . CARPAL TUNNEL RELEASE Bilateral   . CERVICAL BIOPSY  W/ LOOP ELECTRODE EXCISION  05/2016   "precancerous cells"  . CORONARY ANGIOPLASTY WITH STENT PLACEMENT  07/01/2016  . TONSILLECTOMY       Medications: Current Outpatient Prescriptions  Medication Sig Dispense Refill  . albuterol (PROVENTIL HFA;VENTOLIN HFA) 108 (90 BASE) MCG/ACT inhaler Inhale 2 puffs into the lungs every 6 (six) hours as needed for wheezing or shortness of breath.     Marland Kitchen aspirin EC 81 MG EC tablet  Take 1 tablet (81 mg total) by mouth daily. 30 tablet 0  . atorvastatin (LIPITOR) 80 MG tablet Take 1 tablet (80 mg total) by mouth daily at 6 PM. 30 tablet 0  . blood glucose meter kit and supplies KIT Dispense based on patient and insurance preference. Use up to four times daily as directed. (FOR ICD-9 250.00, 250.01). 1 each 0  . BREO ELLIPTA 100-25 MCG/INH AEPB Inhale 1 puff into the lungs daily.  3  . Chlorpheniramine-APAP (CORICIDIN) 2-325 MG TABS Take 2 tablets by mouth every 4 (four) hours as needed (For cold symptoms.).    Marland Kitchen clonazePAM  (KLONOPIN) 1 MG tablet Take 1 mg by mouth 3 (three) times daily.     . fenofibrate (TRICOR) 145 MG tablet Take 1 tablet (145 mg total) by mouth daily. 30 tablet 6  . FLUoxetine (PROZAC) 40 MG capsule Take 80 mg by mouth at bedtime.  1  . irbesartan (AVAPRO) 75 MG tablet Take 1 tablet (75 mg total) by mouth daily. 30 tablet 0  . levothyroxine (SYNTHROID, LEVOTHROID) 25 MCG tablet Take 25 mcg by mouth daily before breakfast.    . metFORMIN (GLUCOPHAGE) 1000 MG tablet Take 1 tablet (1,000 mg total) by mouth 2 (two) times daily with a meal. Held after heart cath. May start back from 07/04/2016.    . Multiple Vitamins-Minerals (MULTIVITAMIN WITH MINERALS) tablet Take 1 tablet by mouth daily.    . nicotine (NICODERM CQ - DOSED IN MG/24 HOURS) 14 mg/24hr patch Place 1 patch (14 mg total) onto the skin daily. 28 patch 0  . QUEtiapine (SEROQUEL) 25 MG tablet Take 25 mg by mouth at bedtime.    . ticagrelor (BRILINTA) 90 MG TABS tablet Take 1 tablet (90 mg total) by mouth 2 (two) times daily. 60 tablet 0  . VASCEPA 1 g CAPS Take 2 g by mouth 2 (two) times daily.  4  . furosemide (LASIX) 40 MG tablet Take 1 tablet (40 mg total) by mouth daily. 30 tablet 6  . potassium chloride SA (KLOR-CON M20) 20 MEQ tablet Take 1 tablet (20 mEq total) by mouth daily. 30 tablet 6   No current facility-administered medications for this visit.     Allergies: Allergies  Allergen Reactions  . Levaquin [Levofloxacin] Rash  . Metoprolol Nausea And Vomiting and Other (See Comments)    Sweats and dizziness    Social History: The patient  reports that she has been smoking Cigarettes.  She has a 13.00 pack-year smoking history. She has never used smokeless tobacco. She reports that she uses drugs, including Marijuana. She reports that she does not drink alcohol.   Family History: The patient's family history includes Diabetes in her mother; Heart failure in her mother.   Review of Systems: Please see the history of present  illness.   Otherwise, the review of systems is positive for none.   All other systems are reviewed and negative.   Physical Exam: VS:  BP 112/76   Pulse 75   Ht 5' 7.5" (1.715 m)   Wt (!) 334 lb 12.8 oz (151.9 kg)   LMP 06/21/2016 (Exact Date)   BMI 51.66 kg/m  .  BMI Body mass index is 51.66 kg/m.  Wt Readings from Last 3 Encounters:  07/19/16 (!) 334 lb 12.8 oz (151.9 kg)  07/02/16 (!) 337 lb 4.9 oz (153 kg)  05/12/16 (!) 310 lb (140.6 kg)    General: Morbidly obese but alert and in no acute distress.  HEENT: Normal.  Neck: Supple, no JVD, carotid bruits, or masses noted.  Cardiac: Regular rate and rhythm. No murmurs, rubs, or gallops. Trace edema. Cath site right radial looks fine. Respiratory:  Lungs are clear to auscultation bilaterally with normal work of breathing.  GI: Soft and nontender.  MS: No deformity or atrophy. Gait and ROM intact.  Skin: Warm and dry. Color is normal.  Neuro:  Strength and sensation are intact and no gross focal deficits noted.  Psych: Alert, appropriate and with normal affect.   LABORATORY DATA:  EKG:  EKG is ordered today. This shows NSR with a PVC.   Lab Results  Component Value Date   WBC 11.4 (H) 07/02/2016   HGB 14.1 07/02/2016   HCT 41.2 07/02/2016   PLT 233 07/02/2016   GLUCOSE 121 (H) 07/02/2016   CHOL 225 (H) 07/02/2016   TRIG 510 (H) 07/02/2016   HDL 28 (L) 07/02/2016   LDLCALC UNABLE TO CALCULATE IF TRIGLYCERIDE OVER 400 mg/dL 07/02/2016   ALT 18 12/07/2011   AST 19 12/07/2011   NA 138 07/02/2016   K 3.7 07/02/2016   CL 104 07/02/2016   CREATININE 0.85 07/02/2016   BUN 10 07/02/2016   CO2 25 07/02/2016   TSH 5.296 (H) 07/02/2014   INR 1.01 07/01/2016   HGBA1C 6.7 (H) 07/01/2016  /*  BNP (last 3 results) No results for input(s): BNP in the last 8760 hours.  ProBNP (last 3 results) No results for input(s): PROBNP in the last 8760 hours.   Other Studies Reviewed Today:  Cardiac catheterization:  07/01/16 Burnell Blanks, MD 07/01/2016 Routine  Narrative & Impression      The left ventricular ejection fraction is 50-55% by visual estimate.  The left ventricular systolic function is normal.  LV end diastolic pressure is normal.  There is no mitral valve regurgitation.  Mid RCA lesion, 100 %stenosed.  A STENT SYNERGY DES 3X20 drug eluting stent was successfully placed.  Prox Cx to Mid Cx lesion, 100 %stenosed.  Post intervention, there is a 0% residual stenosis.  1. Acute lateral MI secondary to occluded Circumflex (NSTEMI) 2. Chronic occlusion proximal RCA with filling of distal vessel from left to right collaterals.  3. Preserved LV systolic function 4. Successful PTCA/DES x 1 mid Circumflex.  Recommendations: DAPT for one year with ASA/Brilinta. Continue statin. Beta blocker as tolerated     Assessment/Plan:  CAD with recent Non-ST elevation myocardial infarction  - s/p DES to circumflex. No intervention to chronically occluded right coronary with collateral blood flow. Preserved LV function on catheterization.  - Cardiac rehabilitation - she has a start date.   - Blood pressure improved. I have discontinued her amlodipine 5 mg and prazosin that she takes before bedtime and have cut back her irbesartan from 300 mg once a day down to 75 mg once a day.  - Continue with dual antiplatelet therapy, aspirin, Brilinta for one year.  - She is not on beta blocker because of hypotension and allergy (she says rash, turned colors)  - Encourage tobacco cessation, marijuana cessation, weight loss, diabetic control, etc. She does seem motivated to make changes.   Morbid obesity  - Continue to encourage weight loss  Diabetes  - Per PCP - weight loss imperative. She does seem motivated to make changes.   Essential hypertension  - Medication changes made as above.  Substance abuse - not using marijuana  Psyche disorder   Current medicines are reviewed with  the patient today.  The patient does not have concerns regarding medicines other than what has been noted above.  The following changes have been made:  See above.  Labs/ tests ordered today include:    Orders Placed This Encounter  Procedures  . Basic metabolic panel  . CBC  . EKG 12-Lead    Disposition:   FU with Dr. [name] in 6 weeks with fasting labs.   Patient is agreeable to this plan and will call if any problems develop in the interim.   Signed: Burtis Junes, RN, ANP-C 07/19/2016 9:29 AM  [facility] [address] [place], [place]  [address] Phone: [phone] Fax: [phone]

## 2016-07-19 ENCOUNTER — Ambulatory Visit (INDEPENDENT_AMBULATORY_CARE_PROVIDER_SITE_OTHER): Payer: Medicare HMO | Admitting: Nurse Practitioner

## 2016-07-19 ENCOUNTER — Encounter: Payer: Self-pay | Admitting: Nurse Practitioner

## 2016-07-19 VITALS — BP 112/76 | HR 75 | Ht 67.5 in | Wt 334.8 lb

## 2016-07-19 DIAGNOSIS — Z955 Presence of coronary angioplasty implant and graft: Secondary | ICD-10-CM | POA: Diagnosis not present

## 2016-07-19 DIAGNOSIS — Z720 Tobacco use: Secondary | ICD-10-CM

## 2016-07-19 DIAGNOSIS — I214 Non-ST elevation (NSTEMI) myocardial infarction: Secondary | ICD-10-CM | POA: Diagnosis not present

## 2016-07-19 MED ORDER — FUROSEMIDE 40 MG PO TABS: 40.0000 mg | ORAL_TABLET | Freq: Every day | ORAL | 6 refills | Status: DC

## 2016-07-19 MED ORDER — POTASSIUM CHLORIDE CRYS ER 20 MEQ PO TBCR: 20.0000 meq | EXTENDED_RELEASE_TABLET | Freq: Every day | ORAL | 6 refills | Status: DC

## 2016-07-19 MED ORDER — FENOFIBRATE 145 MG PO TABS: 145.0000 mg | ORAL_TABLET | Freq: Every day | ORAL | 6 refills | Status: DC

## 2016-07-19 NOTE — Patient Instructions (Addendum)
We will be checking the following labs today - BMET, pro BNP, CBC  Medication Instructions:    Continue with your current medicines. BUT  I am adding back your Lasix 40 mg a day  I am adding back Potassium 20 meq daily  I have refilled your Fenofibrate    Testing/Procedures To Be Arranged:  N/A  Follow-Up:   See me in about 3 to 4 weeks with fasting labs    Other Special Instructions:   See Dr. Criss Rosales - take both of your glucometers with you.   Use your NTG if needed - let us know if your chest pain persists/worsens, etc.     If you need a refill on your cardiac medications before your next appointment, please call your pharmacy.   Call the [place] office at [phone] if you have any questions, problems or concerns.

## 2016-07-20 LAB — CBC
Hematocrit: 41.8 % (ref 34.0–46.6)
Hemoglobin: 14.4 g/dL (ref 11.1–15.9)
MCH: 30.8 pg (ref 26.6–33.0)
MCHC: 34.4 g/dL (ref 31.5–35.7)
MCV: 90 fL (ref 79–97)
Platelets: 234 10*3/uL (ref 150–379)
RBC: 4.67 x10E6/uL (ref 3.77–5.28)
RDW: 12.9 % (ref 12.3–15.4)
WBC: 10.5 10*3/uL (ref 3.4–10.8)

## 2016-07-20 LAB — BASIC METABOLIC PANEL
BUN/Creatinine Ratio: 13 (ref 9–23)
BUN: 11 mg/dL (ref 6–20)
CO2: 22 mmol/L (ref 18–29)
Calcium: 9 mg/dL (ref 8.7–10.2)
Chloride: 100 mmol/L (ref 96–106)
Creatinine, Ser: 0.82 mg/dL (ref 0.57–1.00)
GFR calc Af Amer: 106 mL/min/{1.73_m2} (ref >59)
GFR calc non Af Amer: 92 mL/min/{1.73_m2} (ref >59)
Glucose: 122 mg/dL — ABNORMAL HIGH (ref 65–99)
Potassium: 4 mmol/L (ref 3.5–5.2)
Sodium: 137 mmol/L (ref 134–144)

## 2016-07-27 ENCOUNTER — Ambulatory Visit: Payer: Medicare HMO | Admitting: *Deleted

## 2016-08-02 ENCOUNTER — Inpatient Hospital Stay (HOSPITAL_COMMUNITY): Admission: RE | Admit: 2016-08-02 | Payer: Medicare HMO | Source: Ambulatory Visit

## 2016-08-03 ENCOUNTER — Encounter: Payer: Self-pay | Admitting: *Deleted

## 2016-08-05 ENCOUNTER — Other Ambulatory Visit: Payer: Self-pay | Admitting: *Deleted

## 2016-08-05 ENCOUNTER — Telehealth: Payer: Self-pay | Admitting: Nurse Practitioner

## 2016-08-05 DIAGNOSIS — I10 Essential (primary) hypertension: Secondary | ICD-10-CM | POA: Diagnosis not present

## 2016-08-05 MED ORDER — TICAGRELOR 90 MG PO TABS: 90.0000 mg | ORAL_TABLET | Freq: Two times a day (BID) | ORAL | 3 refills | Status: DC

## 2016-08-05 NOTE — Telephone Encounter (Signed)
New Message       *STAT* If patient is at the pharmacy, call can be transferred to refill team.   1. Which medications need to be refilled? (please list name of each medication and dose if known)  ticagrelor (BRILINTA) 90 MG TABS tablet Take 1 tablet (90 mg total) by mouth 2 (two) times daily.     2. Which pharmacy/location (including street and city if local pharmacy) is medication to be sent to? [facility] [address] [place]  3. Do they need a 30 day or 90 day supply? 90   She is completely out of them

## 2016-08-08 ENCOUNTER — Ambulatory Visit (HOSPITAL_COMMUNITY): Payer: Medicare HMO

## 2016-08-08 NOTE — Progress Notes (Deleted)
CARDIOLOGY OFFICE NOTE  Date:  08/08/2016    Gerald Stabs Date of Birth: [birth year] Medical Record #[id number]  PCP:  Elyn Peers, MD  Cardiologist:  [facility] ***    No chief complaint on file.   History of Present Illness: Calani Gick is a 36 y.o. female who presents today for a ***   Comes in today. Here with   Past Medical History:  Diagnosis Date  . Anxiety   . Bipolar 1 disorder, manic, full remission ([place])   . CHF (congestive heart failure) ([place])   . COPD (chronic obstructive pulmonary disease) ([place])   . Coronary artery disease   . Depression   . GERD (gastroesophageal reflux disease)   . Gout   . High cholesterol   . Hypertension   . Hypothyroidism   . Metabolic syndrome   . NSTEMI (non-ST elevated myocardial infarction) ([place]) 07/01/2016  . On home oxygen therapy    "available; don't use it" (07/01/2016)  . PCOS (polycystic ovarian syndrome)   . Stomach ulcer   . Type 2 diabetes, diet controlled ([place])     Past Surgical History:  Procedure Laterality Date  . CARDIAC CATHETERIZATION N/A 07/01/2016   Procedure: LEFT HEART CATH AND CORONARY ANGIOGRAPHY;  Surgeon: Burnell Blanks, MD;  Location: [place] [facility];  Service: Cardiovascular;  Laterality: N/A;  . CARDIAC CATHETERIZATION N/A 07/01/2016   Procedure: Coronary Stent Intervention;  Surgeon: Burnell Blanks, MD;  Location: [place] [facility];  Service: Cardiovascular;  Laterality: N/A;  . CARPAL TUNNEL RELEASE Bilateral   . CERVICAL BIOPSY  W/ LOOP ELECTRODE EXCISION  05/2016   "precancerous cells"  . CORONARY ANGIOPLASTY WITH STENT PLACEMENT  07/01/2016  . TONSILLECTOMY       Medications: Current Outpatient Prescriptions  Medication Sig Dispense Refill  . albuterol (PROVENTIL HFA;VENTOLIN HFA) 108 (90 BASE) MCG/ACT inhaler Inhale 2 puffs into the lungs every 6 (six) hours as needed for wheezing or shortness of breath.     Marland Kitchen aspirin EC 81 MG EC tablet Take 1  tablet (81 mg total) by mouth daily. 30 tablet 0  . atorvastatin (LIPITOR) 80 MG tablet Take 1 tablet (80 mg total) by mouth daily at 6 PM. 30 tablet 0  . blood glucose meter kit and supplies KIT Dispense based on patient and insurance preference. Use up to four times daily as directed. (FOR ICD-9 250.00, 250.01). 1 each 0  . BREO ELLIPTA 100-25 MCG/INH AEPB Inhale 1 puff into the lungs daily.  3  . Chlorpheniramine-APAP (CORICIDIN) 2-325 MG TABS Take 2 tablets by mouth every 4 (four) hours as needed (For cold symptoms.).    Marland Kitchen clonazePAM (KLONOPIN) 1 MG tablet Take 1 mg by mouth 3 (three) times daily.     . fenofibrate (TRICOR) 145 MG tablet Take 1 tablet (145 mg total) by mouth daily. 30 tablet 6  . FLUoxetine (PROZAC) 40 MG capsule Take 80 mg by mouth at bedtime.  1  . furosemide (LASIX) 40 MG tablet Take 1 tablet (40 mg total) by mouth daily. 30 tablet 6  . irbesartan (AVAPRO) 75 MG tablet Take 1 tablet (75 mg total) by mouth daily. 30 tablet 0  . levothyroxine (SYNTHROID, LEVOTHROID) 25 MCG tablet Take 25 mcg by mouth daily before breakfast.    . metFORMIN (GLUCOPHAGE) 1000 MG tablet Take 1 tablet (1,000 mg total) by mouth 2 (two) times daily with a meal. Held after heart cath. May start back from 07/04/2016.    Marland Kitchen  Multiple Vitamins-Minerals (MULTIVITAMIN WITH MINERALS) tablet Take 1 tablet by mouth daily.    . nicotine (NICODERM CQ - DOSED IN MG/24 HOURS) 14 mg/24hr patch Place 1 patch (14 mg total) onto the skin daily. 28 patch 0  . potassium chloride SA (KLOR-CON M20) 20 MEQ tablet Take 1 tablet (20 mEq total) by mouth daily. 30 tablet 6  . QUEtiapine (SEROQUEL) 25 MG tablet Take 25 mg by mouth at bedtime.    . ticagrelor (BRILINTA) 90 MG TABS tablet Take 1 tablet (90 mg total) by mouth 2 (two) times daily. 180 tablet 3  . VASCEPA 1 g CAPS Take 2 g by mouth 2 (two) times daily.  4   No current facility-administered medications for this visit.     Allergies: Allergies  Allergen Reactions    . Levaquin [Levofloxacin] Rash  . Metoprolol Nausea And Vomiting and Other (See Comments)    Sweats and dizziness    Social History: The patient  reports that she has been smoking Cigarettes.  She has a 13.00 pack-year smoking history. She has never used smokeless tobacco. She reports that she uses drugs, including Marijuana. She reports that she does not drink alcohol.   Family History: The patient's ***family history includes Diabetes in her mother; Healthy in her son; Heart attack (age of onset: 66) in her mother; Heart disease in her mother; Heart failure in her mother.   Review of Systems: Please see the history of present illness.   Otherwise, the review of systems is positive for {NONE DEFAULTED:18576::"none"}.   All other systems are reviewed and negative.   Physical Exam: VS:  There were no vitals taken for this visit. Marland Kitchen  BMI There is no height or weight on file to calculate BMI.  Wt Readings from Last 3 Encounters:  07/19/16 (!) 334 lb 12.8 oz (151.9 kg)  07/02/16 (!) 337 lb 4.9 oz (153 kg)  05/12/16 (!) 310 lb (140.6 kg)    General: Pleasant. Well developed, well nourished and in no acute distress.   HEENT: Normal.  Neck: Supple, no JVD, carotid bruits, or masses noted.  Cardiac: ***Regular rate and rhythm. No murmurs, rubs, or gallops. No edema.  Respiratory:  Lungs are clear to auscultation bilaterally with normal work of breathing.  GI: Soft and nontender.  MS: No deformity or atrophy. Gait and ROM intact.  Skin: Warm and dry. Color is normal.  Neuro:  Strength and sensation are intact and no gross focal deficits noted.  Psych: Alert, appropriate and with normal affect.   LABORATORY DATA:  EKG:  EKG {ACTION; IS/IS HUT:65465035} ordered today. This demonstrates ***.  Lab Results  Component Value Date   WBC 10.5 07/19/2016   HGB 14.1 07/02/2016   HCT 41.8 07/19/2016   PLT 234 07/19/2016   GLUCOSE 122 (H) 07/19/2016   CHOL 225 (H) 07/02/2016   TRIG 510 (H)  07/02/2016   HDL 28 (L) 07/02/2016   LDLCALC UNABLE TO CALCULATE IF TRIGLYCERIDE OVER 400 mg/dL 07/02/2016   ALT 18 12/07/2011   AST 19 12/07/2011   NA 137 07/19/2016   K 4.0 07/19/2016   CL 100 07/19/2016   CREATININE 0.82 07/19/2016   BUN 11 07/19/2016   CO2 22 07/19/2016   TSH 5.296 (H) 07/02/2014   INR 1.01 07/01/2016   HGBA1C 6.7 (H) 07/01/2016    BNP (last 3 results) No results for input(s): BNP in the last 8760 hours.  ProBNP (last 3 results) No results for input(s): PROBNP in the last  8760 hours.   Other Studies Reviewed Today:   Assessment/Plan:   Current medicines are reviewed with the patient today.  The patient does not have concerns regarding medicines other than what has been noted above.  The following changes have been made:  See above.  Labs/ tests ordered today include:   No orders of the defined types were placed in this encounter.    Disposition:   FU with *** in {gen number 4-96:116435} {Days to years:10300}.   Patient is agreeable to this plan and will call if any problems develop in the interim.   SignedTruitt Merle, NP  08/08/2016 3:55 PM  [facility] [address] [place], [place]  [address] Phone: [phone] Fax: [phone]

## 2016-08-09 ENCOUNTER — Ambulatory Visit: Payer: Medicare HMO | Admitting: Nurse Practitioner

## 2016-08-10 ENCOUNTER — Encounter: Payer: Self-pay | Admitting: Nurse Practitioner

## 2016-08-10 ENCOUNTER — Ambulatory Visit (HOSPITAL_COMMUNITY): Payer: Medicare HMO

## 2016-08-11 ENCOUNTER — Telehealth (HOSPITAL_COMMUNITY): Payer: Self-pay | Admitting: *Deleted

## 2016-08-11 ENCOUNTER — Ambulatory Visit (INDEPENDENT_AMBULATORY_CARE_PROVIDER_SITE_OTHER): Payer: Medicare HMO | Admitting: Cardiology

## 2016-08-11 ENCOUNTER — Encounter: Payer: Self-pay | Admitting: Cardiology

## 2016-08-11 ENCOUNTER — Encounter (HOSPITAL_COMMUNITY)
Admission: RE | Admit: 2016-08-11 | Discharge: 2016-08-11 | Disposition: A | Discharge status: Home / Self Care | Payer: Medicare HMO | Source: Ambulatory Visit | Attending: Cardiology | Admitting: Cardiology

## 2016-08-11 VITALS — BP 118/60 | HR 77 | Ht 66.0 in | Wt 325.2 lb

## 2016-08-11 VITALS — BP 108/80 | HR 70 | Ht 66.0 in | Wt 324.4 lb

## 2016-08-11 DIAGNOSIS — E782 Mixed hyperlipidemia: Secondary | ICD-10-CM | POA: Diagnosis not present

## 2016-08-11 DIAGNOSIS — I959 Hypotension, unspecified: Secondary | ICD-10-CM | POA: Diagnosis not present

## 2016-08-11 DIAGNOSIS — I214 Non-ST elevation (NSTEMI) myocardial infarction: Secondary | ICD-10-CM | POA: Diagnosis not present

## 2016-08-11 DIAGNOSIS — Z720 Tobacco use: Secondary | ICD-10-CM

## 2016-08-11 DIAGNOSIS — Z955 Presence of coronary angioplasty implant and graft: Secondary | ICD-10-CM

## 2016-08-11 MED ORDER — ATORVASTATIN CALCIUM 80 MG PO TABS: 80.0000 mg | ORAL_TABLET | Freq: Every day | ORAL | 6 refills | Status: DC

## 2016-08-11 NOTE — Progress Notes (Signed)
Cardiology Office Note   Date:  08/11/2016   ID:  Kaitlyn Chambers, DOB 1980-10-23, MRN [id number]  PCP:  Elyn Peers, MD  Cardiologist:  Dr. [name]    Chief Complaint  Patient presents with  . Coronary Artery Disease    chest pain      History of Present Illness: Kaitlyn Chambers is a 36 y.o. female who presents for CAD with recent hx on Dec 2017 of NSTEMI with DES to LCX.  Today she is being seen for chest pain.  This occurs almost daily.  Usually when she becomes upset and walks up stairs she has chest pressure and resolves with rest.  Once it was severe enough to take NTG but no help. This has increased since discharge from the hospital.  She has panic attacks and is unsure if this is cause of pressure.  Also relates of episodes of near syncope- very lightheaded.  While her meds have been decreased she still has the symptoms.  She is on Minipress for PTSD.  She has been unable to take Avapro that was ordered on last visit.     On last visit Blood pressure improved. She saw Truitt Merle, NP who discontinued her amlodipine 5 mg and prazosinthat she takes before bedtime and decreased her irbesartan from 300 mg once a day down to 75 mg once a day.  She is on ASA and Brilinta.  She could not take BB due to hypotension and rash.    + tobacco down from 2ppd to 6-7 cigarettes per day.  No more marijuana. She is unable to go to cardiac rehab until pain improved.  She is to see psychiatrist this afternoon and will discuss meds.  We are stopping the minipress.      Past Medical History:  Diagnosis Date  . Anxiety   . Bipolar 1 disorder, manic, full remission ([place])   . CHF (congestive heart failure) ([place])   . COPD (chronic obstructive pulmonary disease) ([place])   . Coronary artery disease   . Depression   . GERD (gastroesophageal reflux disease)   . Gout   . High cholesterol   . Hypertension   . Hypothyroidism   . Metabolic syndrome   . NSTEMI (non-ST elevated myocardial  infarction) ([place]) 07/01/2016  . On home oxygen therapy    "available; don't use it" (07/01/2016)  . PCOS (polycystic ovarian syndrome)   . Stomach ulcer   . Type 2 diabetes, diet controlled ([place])     Past Surgical History:  Procedure Laterality Date  . CARDIAC CATHETERIZATION N/A 07/01/2016   Procedure: LEFT HEART CATH AND CORONARY ANGIOGRAPHY;  Surgeon: Burnell Blanks, MD;  Location: [facility];  Service: Cardiovascular;  Laterality: N/A;  . CARDIAC CATHETERIZATION N/A 07/01/2016   Procedure: Coronary Stent Intervention;  Surgeon: Burnell Blanks, MD;  Location: [facility];  Service: Cardiovascular;  Laterality: N/A;  . CARPAL TUNNEL RELEASE Bilateral   . CERVICAL BIOPSY  W/ LOOP ELECTRODE EXCISION  05/2016   "precancerous cells"  . CORONARY ANGIOPLASTY WITH STENT PLACEMENT  07/01/2016  . TONSILLECTOMY       Current Outpatient Prescriptions  Medication Sig Dispense Refill  . albuterol (PROVENTIL HFA;VENTOLIN HFA) 108 (90 BASE) MCG/ACT inhaler Inhale 2 puffs into the lungs every 6 (six) hours as needed for wheezing or shortness of breath.     Marland Kitchen aspirin EC 81 MG EC tablet Take 1 tablet (81 mg total) by mouth daily. 30 tablet 0  . atorvastatin (  LIPITOR) 80 MG tablet Take 1 tablet (80 mg total) by mouth daily at 6 PM. 30 tablet 0  . blood glucose meter kit and supplies KIT Dispense based on patient and insurance preference. Use up to four times daily as directed. (FOR ICD-9 250.00, 250.01). 1 each 0  . BREO ELLIPTA 100-25 MCG/INH AEPB Inhale 1 puff into the lungs daily.  3  . clonazePAM (KLONOPIN) 1 MG tablet Take 1 mg by mouth 3 (three) times daily.     . fenofibrate (TRICOR) 145 MG tablet Take 1 tablet (145 mg total) by mouth daily. 30 tablet 6  . FLUoxetine (PROZAC) 40 MG capsule Take 40 mg by mouth at bedtime.   1  . furosemide (LASIX) 40 MG tablet Take 1 tablet (40 mg total) by mouth daily. 30 tablet 6  . irbesartan (AVAPRO) 75 MG tablet Take 1 tablet  (75 mg total) by mouth daily. 30 tablet 0  . levothyroxine (SYNTHROID, LEVOTHROID) 25 MCG tablet Take 25 mcg by mouth daily before breakfast.    . metFORMIN (GLUCOPHAGE) 1000 MG tablet Take 1 tablet (1,000 mg total) by mouth 2 (two) times daily with a meal. Held after heart cath. May start back from 07/04/2016.    . Multiple Vitamins-Minerals (MULTIVITAMIN WITH MINERALS) tablet Take 1 tablet by mouth daily.    . nicotine (NICODERM CQ - DOSED IN MG/24 HOURS) 14 mg/24hr patch Place 1 patch (14 mg total) onto the skin daily. 28 patch 0  . potassium chloride SA (KLOR-CON M20) 20 MEQ tablet Take 1 tablet (20 mEq total) by mouth daily. 30 tablet 6  . QUEtiapine (SEROQUEL) 25 MG tablet Take 25 mg by mouth at bedtime.    . ticagrelor (BRILINTA) 90 MG TABS tablet Take 1 tablet (90 mg total) by mouth 2 (two) times daily. 180 tablet 3  . VASCEPA 1 g CAPS Take 2 g by mouth 2 (two) times daily.  4   No current facility-administered medications for this visit.     Allergies:   Levaquin [levofloxacin] and Metoprolol    Social History:  The patient  reports that she has been smoking Cigarettes.  She has a 13.00 pack-year smoking history. She has never used smokeless tobacco. She reports that she uses drugs, including Marijuana. She reports that she does not drink alcohol.   Family History:  The patient's family history includes Diabetes in her mother; Healthy in her son; Heart attack (age of onset: 22) in her mother; Heart disease in her mother; Heart failure in her mother.    ROS:  General:no colds or fevers, no weight changes Skin:no rashes or ulcers HEENT:no blurred vision, no congestion CV:see HPI PUL:see HPI GI:no diarrhea constipation or melena, no indigestion GU:no hematuria, no dysuria MS:no joint pain, no claudication Neuro:no syncope, no lightheadedness Endo:+ diabetes stable, + thyroid disease  Wt Readings from Last 3 Encounters:  08/11/16 (!) 324 lb 6.4 oz (147.1 kg)  08/11/16 (!) 325 lb  2.9 oz (147.5 kg)  07/19/16 (!) 334 lb 12.8 oz (151.9 kg)     PHYSICAL EXAM: VS:  BP 108/80 (BP Location: Right Arm, Patient Position: Sitting, Cuff Size: Large)   Pulse 70   Ht '5\' 6"'  (1.676 m)   Wt (!) 324 lb 6.4 oz (147.1 kg)   SpO2 91%   BMI 52.36 kg/m  , BMI Body mass index is 52.36 kg/m. General:Pleasant affect, NAD Skin:Warm and dry, brisk capillary refill, + tattoos  HEENT:normocephalic, sclera clear, mucus membranes moist Neck:supple, no JVD,  no bruits  Heart:S1S2 RRR without murmur, gallup, rub or click Lungs:clear without rales, rhonchi, or wheezes LHT:DSKAJ, soft, non tender, + BS, do not palpate liver spleen or masses Ext:no lower ext edema, 2+ pedal pulses, 2+ radial pulses Neuro:alert and oriented X 3, MAE, follows commands, + facial symmetry    EKG:  EKG is NOT ordered today.    Recent Labs: 07/02/2016: Hemoglobin 14.1 07/19/2016: BUN 11; Creatinine, Ser 0.82; Platelets 234; Potassium 4.0; Sodium 137    Lipid Panel    Component Value Date/Time   CHOL 225 (H) 07/02/2016 1046   TRIG 510 (H) 07/02/2016 1046   HDL 28 (L) 07/02/2016 1046   CHOLHDL 8.0 07/02/2016 1046   VLDL UNABLE TO CALCULATE IF TRIGLYCERIDE OVER 400 mg/dL 07/02/2016 1046   LDLCALC UNABLE TO CALCULATE IF TRIGLYCERIDE OVER 400 mg/dL 07/02/2016 1046       Other studies Reviewed: Additional studies/ records that were reviewed today include: . Cardiac cath: Procedures   Coronary Stent Intervention  LEFT HEART CATH AND CORONARY ANGIOGRAPHY  Conclusion     The left ventricular ejection fraction is 50-55% by visual estimate.  The left ventricular systolic function is normal.  LV end diastolic pressure is normal.  There is no mitral valve regurgitation.  Mid RCA lesion, 100 %stenosed.  A STENT SYNERGY DES 3X20 drug eluting stent was successfully placed.  Prox Cx to Mid Cx lesion, 100 %stenosed.  Post intervention, there is a 0% residual stenosis.   1. Acute lateral MI  secondary to occluded Circumflex (NSTEMI) 2. Chronic occlusion proximal RCA with filling of distal vessel from left to right collaterals.  3. Preserved LV systolic function 4. Successful PTCA/DES x 1 mid Circumflex.  Recommendations: DAPT for one year with ASA/Brilinta. Continue statin. Beta blocker as tolerated.       ASSESSMENT AND PLAN:  1. Recent NSTEMI pk troponin 2.49,  She has totalled RCA with Lt to right collaterals and 100% LCX with Stent placement.  DES   2. Chest pain, unsure if angina or anxiety.  Discussed with Dr. Johnsie Cancel DOD, stop minipress to see if improved BP will help prevent pain.  Follow up with Dr. [name] in 2-3 weeks to re-eval, though if pain increases she will need to go to ER.  She will also see psychiatrist today to assist with meds.  --will hold cardiac rehab for now.  - when Bp improves we would prefer avapro over minipress  3.  BP is low with episodes of hypotension she believes with near syncope.   4. HLD on statin high dose.    5. Anxiety see above.     Current medicines are reviewed with the patient today.  The patient Has no concerns regarding medicines.  The following changes have been made:  See above Labs/ tests ordered today include:see above  Disposition:   FU:  see above  Signed, Cecilie Kicks, NP  08/11/2016 3:20 PM    [facility] [place], [place], [place], [place] Phone: [phone]; Fax: [phone]

## 2016-08-11 NOTE — Telephone Encounter (Signed)
-----   Message from Isaiah Serge, NP sent at 08/11/2016  4:32 PM EST ----- Regarding: RE: Please reveiw cardiac rehab progress note  Hold cardiac rehab until meds adjusted and pt seen back in 2-3 weeks.  Thanks. Mickel Baas  ----- Message ----- From: Rowe Pavy, RN Sent: 08/11/2016   8:55 AM To: Isaiah Serge, NP Subject: Please reveiw cardiac rehab progress note      Good Morning Mickel Baas  Please review progress note from cardiac rehab regarding the above pt. She has an appt with you on today at 11 for evaluation of angina and blood pressure management.  Pt in this morning for orientation. Please indicate upon your evaluation how best to proceed. Thanks [employer] RN

## 2016-08-11 NOTE — Progress Notes (Signed)
Pt in today for cardiac rehab orientation this morning.  Pt rescheduled from previous appt date due to the weather/ school closing.  During orientation intake, pt remarks that she has an appt today at 33 for angina symptoms like prior to her cardiac event in December.  Presently pt does not have any cp nor experienced any with the walk over from the main entrance which is about 1/4 mile to our department.  Will proceed with the orientation intake with the exception of 6 minute walk test for determination of functional capacity.  Will await outcome an d evaluation of angina from this appt to determine when pt may proceed with cardiac rehab.  Will route this note to the provider pt is seeing - Cecilie Kicks. Cherre Huger, BSN

## 2016-08-11 NOTE — Progress Notes (Signed)
Cardiac Rehab Medication Review by a Pharmacist  Does the patient  feel that his/her medications are working for him/her?  yes  Has the patient been experiencing any side effects to the medications prescribed?  no  Does the patient measure his/her own blood pressure or blood glucose at home?  yes - blood glucose  Does the patient have any problems obtaining medications due to transportation or finances?   Yes, nicotine patches  Understanding of regimen: good Understanding of indications: good Potential of compliance: good  Pharmacist comments: Kaitlyn Chambers is a 1 yof who presents to cardiac rehab today in good spirits. Overall, she has a pretty good understanding of her medications. She has an appointment with her physician today, and I recommended that she discuss the following at that appointment:   1. Atorvastatin - has been out of medication and needs refills.   2. Nicotine patches - is very interested in quitting smoking, and has cut back significantly in the last few weeks (reports smoking ~6-7 cigarettes/day). Her insurance would not cover the nicotine patches unfortunately, so she is having trouble with quitting smoking completely on her own. Her insurance/pharmacy is changing, so I recommended that she try again once this switch is made. I also recommended that she discuss other smoking cessation options with her physician that are not over-the-counter.  3. Irbesartan - at cardiac rehab this morning, she could not remember if she is taking this or if she ever picked this up from the pharmacy. I told her that this can be a very important medication, so to please discuss the need for this medication at her appointment today.   Demetrius Charity, PharmD Acute Care Pharmacy Resident  Pager: [phone] 08/11/2016

## 2016-08-11 NOTE — Telephone Encounter (Signed)
Pt called rehab staff after appt with rhonda Barrett.  Will hold the start of cardiac rehab until seen in the office by Dr. [name].  Will follow up after appt.  Cherre Huger, BSN

## 2016-08-11 NOTE — Patient Instructions (Signed)
Medication Instructions:  1) DISCONTINUE Minipress  Labwork: None  Testing/Procedures: None  Follow-Up: Your physician recommends that you schedule a follow-up appointment in: 2-3 weeks with Dr. [name] or an APP on a day that Dr. [name] is in the office.    Any Other Special Instructions Will Be Listed Below (If Applicable).  No cardiac Rehab at this time.    If you need a refill on your cardiac medications before your next appointment, please call your pharmacy.

## 2016-08-12 ENCOUNTER — Ambulatory Visit (HOSPITAL_COMMUNITY): Payer: Medicare HMO

## 2016-08-12 DIAGNOSIS — J449 Chronic obstructive pulmonary disease, unspecified: Secondary | ICD-10-CM | POA: Diagnosis not present

## 2016-08-15 ENCOUNTER — Ambulatory Visit (HOSPITAL_COMMUNITY): Payer: Medicare HMO

## 2016-08-15 ENCOUNTER — Encounter (HOSPITAL_COMMUNITY): Payer: Self-pay

## 2016-08-17 ENCOUNTER — Ambulatory Visit (HOSPITAL_COMMUNITY): Payer: Medicare HMO

## 2016-08-17 ENCOUNTER — Encounter (HOSPITAL_COMMUNITY): Admission: RE | Admit: 2016-08-17 | Payer: Medicare HMO | Source: Ambulatory Visit

## 2016-08-19 ENCOUNTER — Encounter (HOSPITAL_COMMUNITY): Payer: Medicare HMO

## 2016-08-19 ENCOUNTER — Ambulatory Visit (HOSPITAL_COMMUNITY): Payer: Medicare HMO

## 2016-08-21 ENCOUNTER — Encounter: Payer: Self-pay | Admitting: Nurse Practitioner

## 2016-08-22 ENCOUNTER — Encounter (HOSPITAL_COMMUNITY): Payer: Medicare HMO

## 2016-08-22 ENCOUNTER — Ambulatory Visit (HOSPITAL_COMMUNITY): Payer: Medicare HMO

## 2016-08-22 NOTE — Telephone Encounter (Signed)
Will call [facility]cy

## 2016-08-22 NOTE — Telephone Encounter (Signed)
SPOKE WITH  PT  RE E MAIL   . PT IS PAIN FREE AT HIS TIME  AND NOTES  THIS  PAIN IS DIFFERENT  THAN  WITH PREVIOUS MI. DID TRY  TAKING  A  NTG  WITH NO  RELIEF. PT  STATES  HAS  ANXIETY  AND THIS  MAY  BE  ISSUE , AS WELL .  SUGGESTED  MAYBE TO TRY AND  TAKE  TUMS OR ROLAIDS   DISCUSSED WITH LAURA INGOLD NP .PER LAURA  IF  PAIN PERSISTS  THEN MAY  F/U WITH  PMD   OR  GO TO ER  FOR  EVAL  TX. ALSO  MAY TRY NTG  AGAIN OR  TRY TYLENOL  AS DIRECTED.  PT  FEELS  MAY  EVEN  BE   MUSCULAR  SKELETAL PT  VERBALIZED UNDERSTANDING .Adonis Housekeeper

## 2016-08-24 ENCOUNTER — Encounter (HOSPITAL_COMMUNITY): Payer: Medicare HMO

## 2016-08-24 ENCOUNTER — Ambulatory Visit (HOSPITAL_COMMUNITY): Payer: Medicare HMO

## 2016-08-26 ENCOUNTER — Ambulatory Visit (HOSPITAL_COMMUNITY): Payer: Medicare HMO

## 2016-08-26 ENCOUNTER — Encounter (HOSPITAL_COMMUNITY): Payer: Medicare HMO

## 2016-08-27 DIAGNOSIS — R079 Chest pain, unspecified: Secondary | ICD-10-CM | POA: Diagnosis not present

## 2016-08-29 ENCOUNTER — Encounter (HOSPITAL_COMMUNITY): Payer: Medicare HMO

## 2016-08-29 ENCOUNTER — Ambulatory Visit (HOSPITAL_COMMUNITY): Payer: Medicare HMO

## 2016-08-31 ENCOUNTER — Encounter (HOSPITAL_COMMUNITY): Payer: Self-pay | Admitting: *Deleted

## 2016-08-31 ENCOUNTER — Ambulatory Visit (HOSPITAL_COMMUNITY): Payer: Medicare HMO

## 2016-08-31 ENCOUNTER — Encounter (HOSPITAL_COMMUNITY): Payer: Medicare HMO

## 2016-08-31 NOTE — Progress Notes (Signed)
Cardiac Individual Treatment Plan  Patient Details  Name: Kaitlyn Chambers MRN: [id number] Date of Birth: 1980-12-13 Referring Provider:    Initial Encounter Date:   Visit Diagnosis: 12/29 NSTEMI and PTCA/DES CFX Patient's Home Medications on Admission:  Current Outpatient Prescriptions:  .  albuterol (PROVENTIL HFA;VENTOLIN HFA) 108 (90 BASE) MCG/ACT inhaler, Inhale 2 puffs into the lungs every 6 (six) hours as needed for wheezing or shortness of breath. , Disp: , Rfl:  .  aspirin EC 81 MG EC tablet, Take 1 tablet (81 mg total) by mouth daily., Disp: 30 tablet, Rfl: 0 .  atorvastatin (LIPITOR) 80 MG tablet, Take 1 tablet (80 mg total) by mouth daily at 6 PM., Disp: 30 tablet, Rfl: 6 .  blood glucose meter kit and supplies KIT, Dispense based on patient and insurance preference. Use up to four times daily as directed. (FOR ICD-9 250.00, 250.01)., Disp: 1 each, Rfl: 0 .  BREO ELLIPTA 100-25 MCG/INH AEPB, Inhale 1 puff into the lungs daily., Disp: , Rfl: 3 .  clonazePAM (KLONOPIN) 1 MG tablet, Take 1 mg by mouth 3 (three) times daily. , Disp: , Rfl:  .  fenofibrate (TRICOR) 145 MG tablet, Take 1 tablet (145 mg total) by mouth daily., Disp: 30 tablet, Rfl: 6 .  FLUoxetine (PROZAC) 40 MG capsule, Take 40 mg by mouth at bedtime. , Disp: , Rfl: 1 .  furosemide (LASIX) 40 MG tablet, Take 1 tablet (40 mg total) by mouth daily., Disp: 30 tablet, Rfl: 6 .  irbesartan (AVAPRO) 75 MG tablet, Take 1 tablet (75 mg total) by mouth daily., Disp: 30 tablet, Rfl: 0 .  levothyroxine (SYNTHROID, LEVOTHROID) 25 MCG tablet, Take 25 mcg by mouth daily before breakfast., Disp: , Rfl:  .  metFORMIN (GLUCOPHAGE) 1000 MG tablet, Take 1 tablet (1,000 mg total) by mouth 2 (two) times daily with a meal. Held after heart cath. May start back from 07/04/2016., Disp: , Rfl:  .  Multiple Vitamins-Minerals (MULTIVITAMIN WITH MINERALS) tablet, Take 1 tablet by mouth daily., Disp: , Rfl:  .  nicotine (NICODERM CQ - DOSED IN  MG/24 HOURS) 14 mg/24hr patch, Place 1 patch (14 mg total) onto the skin daily., Disp: 28 patch, Rfl: 0 .  potassium chloride SA (KLOR-CON M20) 20 MEQ tablet, Take 1 tablet (20 mEq total) by mouth daily., Disp: 30 tablet, Rfl: 6 .  QUEtiapine (SEROQUEL) 25 MG tablet, Take 25 mg by mouth at bedtime., Disp: , Rfl:  .  ticagrelor (BRILINTA) 90 MG TABS tablet, Take 1 tablet (90 mg total) by mouth 2 (two) times daily., Disp: 180 tablet, Rfl: 3 .  VASCEPA 1 g CAPS, Take 2 g by mouth 2 (two) times daily., Disp: , Rfl: 4  Past Medical History: Past Medical History:  Diagnosis Date  . Anxiety   . Bipolar 1 disorder, manic, full remission ([place])   . CHF (congestive heart failure) ([place])   . COPD (chronic obstructive pulmonary disease) ([place])   . Coronary artery disease   . Depression   . GERD (gastroesophageal reflux disease)   . Gout   . High cholesterol   . Hypertension   . Hypothyroidism   . Metabolic syndrome   . NSTEMI (non-ST elevated myocardial infarction) ([place]) 07/01/2016  . On home oxygen therapy    "available; don't use it" (07/01/2016)  . PCOS (polycystic ovarian syndrome)   . Stomach ulcer   . Type 2 diabetes, diet controlled ([place])     Tobacco Use: History  Smoking Status  .  Current Every Day Smoker  . Packs/day: 0.50  . Years: 26.00  . Types: Cigarettes  Smokeless Tobacco  . Never Used    Labs: Recent Review Flowsheet Data    Labs for ITP Cardiac and Pulmonary Rehab Latest Ref Rng & Units 07/01/2016 07/02/2016   Cholestrol 0 - 200 mg/dL - 225(H)   LDLCALC 0 - 99 mg/dL - UNABLE TO CALCULATE IF TRIGLYCERIDE OVER 400 mg/dL   HDL >40 mg/dL - 28(L)   Trlycerides <150 mg/dL - 510(H)   Hemoglobin A1c 4.8 - 5.6 % 6.7(H) -      Capillary Blood Glucose: Lab Results  Component Value Date   GLUCAP 146 (H) 07/03/2016   GLUCAP 123 (H) 07/03/2016   GLUCAP 134 (H) 07/02/2016   GLUCAP 153 (H) 07/02/2016   GLUCAP 172 (H) 07/01/2016     Exercise Target Goals:    Exercise  Program Goal: Individual exercise prescription set with THRR, safety & activity barriers. Participant demonstrates ability to understand and report RPE using BORG scale, to self-measure pulse accurately, and to acknowledge the importance of the exercise prescription.  Exercise Prescription Goal: Starting with aerobic activity 30 plus minutes a day, 3 days per week for initial exercise prescription. Provide home exercise prescription and guidelines that participant acknowledges understanding prior to discharge.  Activity Barriers & Risk Stratification:     Activity Barriers & Cardiac Risk Stratification - 08/11/16 0841      Activity Barriers & Cardiac Risk Stratification   Activity Barriers None   Cardiac Risk Stratification High      6 Minute Walk:   Oxygen Initial Assessment:   Oxygen Re-Evaluation:   Oxygen Discharge (Final Oxygen Re-Evaluation):   Initial Exercise Prescription:   Perform Capillary Blood Glucose checks as needed.  Exercise Prescription Changes:   Exercise Comments:     Exercise Comments    Row Name 08/31/16 1739           Exercise Comments Pt has not exercise in cardiac rehab due to c/o CP. Pt has f/u with cardiology team. Pt plans to come to cardiac rehab once cleared by cardiology and is stabled.          Exercise Goals and Review:   Exercise Goals Re-Evaluation :    Discharge Exercise Prescription (Final Exercise Prescription Changes):   Nutrition:  Target Goals: Understanding of nutrition guidelines, daily intake of sodium <1574m, cholesterol <2073m calories 30% from fat and 7% or less from saturated fats, daily to have 5 or more servings of fruits and vegetables.  Biometrics:     Pre Biometrics - 08/11/16 1121      Pre Biometrics   Waist Circumference 54.25 inches   Hip Circumference 55.75 inches   Waist to Hip Ratio 0.97 %   Triceps Skinfold 47 mm   % Body Fat 48.2 %   Grip Strength 40.5 kg   Flexibility 12.5 in    Single Leg Stand 17.84 seconds       Nutrition Therapy Plan and Nutrition Goals:   Nutrition Discharge: Nutrition Scores:   Nutrition Goals Re-Evaluation:   Nutrition Goals Re-Evaluation:   Nutrition Goals Discharge (Final Nutrition Goals Re-Evaluation):   Psychosocial: Target Goals: Acknowledge presence or absence of significant depression and/or stress, maximize coping skills, provide positive support system. Participant is able to verbalize types and ability to use techniques and skills needed for reducing stress and depression.  Initial Review & Psychosocial Screening:     Initial Psych Review & Screening - 08/31/16 1911  Initial Review   Current issues with Current Anxiety/Panic;Current Stress Concerns   Source of Stress Concerns Financial;Family     Family Dynamics   Good Support System? Yes      Quality of Life Scores:   PHQ-9: Recent Review Flowsheet Data    There is no flowsheet data to display.     Interpretation of Total Score  Total Score Depression Severity:  1-4 = Minimal depression, 5-9 = Mild depression, 10-14 = Moderate depression, 15-19 = Moderately severe depression, 20-27 = Severe depression   Psychosocial Evaluation and Intervention:     Psychosocial Evaluation - 08/31/16 1911      Psychosocial Evaluation & Interventions   Interventions Encouraged to exercise with the program and follow exercise prescription;Stress management education;Relaxation education   Continue Psychosocial Services  Follow up required by staff      Psychosocial Re-Evaluation:   Psychosocial Discharge (Final Psychosocial Re-Evaluation):   Vocational Rehabilitation: Provide vocational rehab assistance to qualifying candidates.   Vocational Rehab Evaluation & Intervention:   Education: Education Goals: Education classes will be provided on a weekly basis, covering required topics. Participant will state understanding/return demonstration of topics  presented.  Learning Barriers/Preferences:     Learning Barriers/Preferences - 08/11/16 0841      Learning Barriers/Preferences   Learning Barriers None   Learning Preferences Written Material      Education Topics: Count Your Pulse:  -Group instruction provided by verbal instruction, demonstration, patient participation and written materials to support subject.  Instructors address importance of being able to find your pulse and how to count your pulse when at home without a heart monitor.  Patients get hands on experience counting their pulse with staff help and individually.   Heart Attack, Angina, and Risk Factor Modification:  -Group instruction provided by verbal instruction, video, and written materials to support subject.  Instructors address signs and symptoms of angina and heart attacks.    Also discuss risk factors for heart disease and how to make changes to improve heart health risk factors.   Functional Fitness:  -Group instruction provided by verbal instruction, demonstration, patient participation, and written materials to support subject.  Instructors address safety measures for doing things around the house.  Discuss how to get up and down off the floor, how to pick things up properly, how to safely get out of a chair without assistance, and balance training.   Meditation and Mindfulness:  -Group instruction provided by verbal instruction, patient participation, and written materials to support subject.  Instructor addresses importance of mindfulness and meditation practice to help reduce stress and improve awareness.  Instructor also leads participants through a meditation exercise.    Stretching for Flexibility and Mobility:  -Group instruction provided by verbal instruction, patient participation, and written materials to support subject.  Instructors lead participants through series of stretches that are designed to increase flexibility thus improving mobility.   These stretches are additional exercise for major muscle groups that are typically performed during regular warm up and cool down.   Hands Only CPR Anytime:  -Group instruction provided by verbal instruction, video, patient participation and written materials to support subject.  Instructors co-teach with AHA video for hands only CPR.  Participants get hands on experience with mannequins.   Nutrition I class: Heart Healthy Eating:  -Group instruction provided by PowerPoint slides, verbal discussion, and written materials to support subject matter. The instructor gives an explanation and review of the Therapeutic Lifestyle Changes diet recommendations, which includes a discussion on  lipid goals, dietary fat, sodium, fiber, plant stanol/sterol esters, sugar, and the components of a well-balanced, healthy diet.   Nutrition II class: Lifestyle Skills:  -Group instruction provided by PowerPoint slides, verbal discussion, and written materials to support subject matter. The instructor gives an explanation and review of label reading, grocery shopping for heart health, heart healthy recipe modifications, and ways to make healthier choices when eating out.   Diabetes Question & Answer:  -Group instruction provided by PowerPoint slides, verbal discussion, and written materials to support subject matter. The instructor gives an explanation and review of diabetes co-morbidities, pre- and post-prandial blood glucose goals, pre-exercise blood glucose goals, signs, symptoms, and treatment of hypoglycemia and hyperglycemia, and foot care basics.   Diabetes Blitz:  -Group instruction provided by PowerPoint slides, verbal discussion, and written materials to support subject matter. The instructor gives an explanation and review of the physiology behind type 1 and type 2 diabetes, diabetes medications and rational behind using different medications, pre- and post-prandial blood glucose recommendations and  Hemoglobin A1c goals, diabetes diet, and exercise including blood glucose guidelines for exercising safely.    Portion Distortion:  -Group instruction provided by PowerPoint slides, verbal discussion, written materials, and food models to support subject matter. The instructor gives an explanation of serving size versus portion size, changes in portions sizes over the last 20 years, and what consists of a serving from each food group.   Stress Management:  -Group instruction provided by verbal instruction, video, and written materials to support subject matter.  Instructors review role of stress in heart disease and how to cope with stress positively.     Exercising on Your Own:  -Group instruction provided by verbal instruction, power point, and written materials to support subject.  Instructors discuss benefits of exercise, components of exercise, frequency and intensity of exercise, and end points for exercise.  Also discuss use of nitroglycerin and activating EMS.  Review options of places to exercise outside of rehab.  Review guidelines for sex with heart disease.   Cardiac Drugs I:  -Group instruction provided by verbal instruction and written materials to support subject.  Instructor reviews cardiac drug classes: antiplatelets, anticoagulants, beta blockers, and statins.  Instructor discusses reasons, side effects, and lifestyle considerations for each drug class.   Cardiac Drugs II:  -Group instruction provided by verbal instruction and written materials to support subject.  Instructor reviews cardiac drug classes: angiotensin converting enzyme inhibitors (ACE-I), angiotensin II receptor blockers (ARBs), nitrates, and calcium channel blockers.  Instructor discusses reasons, side effects, and lifestyle considerations for each drug class.   Anatomy and Physiology of the Circulatory System:  -Group instruction provided by verbal instruction, video, and written materials to support  subject.  Reviews functional anatomy of heart, how it relates to various diagnoses, and what role the heart plays in the overall system.   Knowledge Questionnaire Score:   Core Components/Risk Factors/Patient Goals at Admission:     Personal Goals and Risk Factors at Admission - 08/11/16 1123      Core Components/Risk Factors/Patient Goals on Admission    Weight Management Yes;Obesity;Weight Loss;Weight Maintenance   Intervention Weight Management: Develop a combined nutrition and exercise program designed to reach desired caloric intake, while maintaining appropriate intake of nutrient and fiber, sodium and fats, and appropriate energy expenditure required for the weight goal.;Weight Management: Provide education and appropriate resources to help participant work on and attain dietary goals.;Obesity: Provide education and appropriate resources to help participant work on and attain dietary  goals.;Weight Management/Obesity: Establish reasonable short term and long term weight goals.   Expected Outcomes Short Term: Continue to assess and modify interventions until short term weight is achieved;Long Term: Adherence to nutrition and physical activity/exercise program aimed toward attainment of established weight goal;Weight Maintenance: Understanding of the daily nutrition guidelines, which includes 25-35% calories from fat, 7% or less cal from saturated fats, less than 277m cholesterol, less than 1.5gm of sodium, & 5 or more servings of fruits and vegetables daily;Understanding recommendations for meals to include 15-35% energy as protein, 25-35% energy from fat, 35-60% energy from carbohydrates, less than 2085mof dietary cholesterol, 20-35 gm of total fiber daily;Weight Loss: Understanding of general recommendations for a balanced deficit meal plan, which promotes 1-2 lb weight loss per week and includes a negative energy balance of [phone] kcal/d;Understanding of distribution of calorie intake  throughout the day with the consumption of 4-5 meals/snacks   Increase Strength and Stamina Yes   Intervention Develop an individualized exercise prescription for aerobic and resistive training based on initial evaluation findings, risk stratification, comorbidities and participant's personal goals.   Expected Outcomes Achievement of increased cardiorespiratory fitness and enhanced flexibility, muscular endurance and strength shown through measurements of functional capacity and personal statement of participant.   Tobacco Cessation Yes   Intervention Assist the participant in steps to quit. Provide individualized education and counseling about committing to Tobacco Cessation, relapse prevention, and pharmacological support that can be provided by physician.;OfAdvice workerassist with locating and accessing local/national Quit Smoking programs, and support quit date choice.   Expected Outcomes Short Term: Will demonstrate readiness to quit, by selecting a quit date.;Short Term: Will quit all tobacco product use, adhering to prevention of relapse plan.;Long Term: Complete abstinence from all tobacco products for at least 12 months from quit date.   Diabetes Yes   Intervention Provide education about signs/symptoms and action to take for hypo/hyperglycemia.;Provide education about proper nutrition, including hydration, and aerobic/resistive exercise prescription along with prescribed medications to achieve blood glucose in normal ranges: Fasting glucose 65-99 mg/dL   Expected Outcomes Short Term: Participant verbalizes understanding of the signs/symptoms and immediate care of hyper/hypoglycemia, proper foot care and importance of medication, aerobic/resistive exercise and nutrition plan for blood glucose control.;Long Term: Attainment of HbA1C < 7%.   Hypertension Yes   Intervention Provide education on lifestyle modifcations including regular physical activity/exercise, weight management,  moderate sodium restriction and increased consumption of fresh fruit, vegetables, and low fat dairy, alcohol moderation, and smoking cessation.;Monitor prescription use compliance.   Expected Outcomes Short Term: Continued assessment and intervention until BP is < 140/9046mG in hypertensive participants. < 130/104m64m in hypertensive participants with diabetes, heart failure or chronic kidney disease.;Long Term: Maintenance of blood pressure at goal levels.   Lipids Yes   Intervention Provide education and support for participant on nutrition & aerobic/resistive exercise along with prescribed medications to achieve LDL <70mg51mL >40mg.69mxpected Outcomes Short Term: Participant states understanding of desired cholesterol values and is compliant with medications prescribed. Participant is following exercise prescription and nutrition guidelines.;Long Term: Cholesterol controlled with medications as prescribed, with individualized exercise RX and with personalized nutrition plan. Value goals: LDL < 70mg, 31m> 40 mg.      Core Components/Risk Factors/Patient Goals Review:    Core Components/Risk Factors/Patient Goals at Discharge (Final Review):    ITP Comments:     ITP Comments    Row Name 08/11/16 [id number]  ITP Comments Dr. Fransico Him, Medical Director          Comments: Pt awaiting to begin cardiac rehab until seen by Cecilie Kicks on Friday March 2nd for angina and bp management. Cherre Huger, BSN

## 2016-08-31 NOTE — Addendum Note (Signed)
Encounter addended by: Jazalynn Mireles D Demetrious Rainford on: 08/31/2016  5:42 PM<BR>    Actions taken: Visit Navigator Flowsheet section accepted

## 2016-09-01 NOTE — Progress Notes (Deleted)
Cardiology Office Note   Date:  09/01/2016   ID:  Kaitlyn Chambers, DOB 30-Nov-1980, MRN [id number]  PCP:  Elyn Peers, MD  Cardiologist:  ***    No chief complaint on file.     History of Present Illness: Kaitlyn Chambers is a 36 y.o. female who presents for ***  36 y.o. female who presents for CAD with recent hx on Dec 2017 of NSTEMI with DES to LCX.  Today she is being seen for chest pain.  This occurs almost daily.  Usually when she becomes upset and walks up stairs she has chest pressure and resolves with rest.  Once it was severe enough to take NTG but no help. This has increased since discharge from the hospital.  She has panic attacks and is unsure if this is cause of pressure.  Also relates of episodes of near syncope- very lightheaded.  While her meds have been decreased she still has the symptoms.  She is on Minipress for PTSD.  She has been unable to take Avapro that was ordered on last visit.      Her tobacco is reduced.     Past Medical History:  Diagnosis Date  . Anxiety   . Bipolar 1 disorder, manic, full remission ([place])   . CHF (congestive heart failure) ([place])   . COPD (chronic obstructive pulmonary disease) ([place])   . Coronary artery disease   . Depression   . GERD (gastroesophageal reflux disease)   . Gout   . High cholesterol   . Hypertension   . Hypothyroidism   . Metabolic syndrome   . NSTEMI (non-ST elevated myocardial infarction) ([place]) 07/01/2016  . On home oxygen therapy    "available; don't use it" (07/01/2016)  . PCOS (polycystic ovarian syndrome)   . Stomach ulcer   . Type 2 diabetes, diet controlled ([place])     Past Surgical History:  Procedure Laterality Date  . CARDIAC CATHETERIZATION N/A 07/01/2016   Procedure: LEFT HEART CATH AND CORONARY ANGIOGRAPHY;  Surgeon: Burnell Blanks, MD;  Location: [facility];  Service: Cardiovascular;  Laterality: N/A;  . CARDIAC CATHETERIZATION N/A 07/01/2016   Procedure: Coronary Stent  Intervention;  Surgeon: Burnell Blanks, MD;  Location: [facility];  Service: Cardiovascular;  Laterality: N/A;  . CARPAL TUNNEL RELEASE Bilateral   . CERVICAL BIOPSY  W/ LOOP ELECTRODE EXCISION  05/2016   "precancerous cells"  . CORONARY ANGIOPLASTY WITH STENT PLACEMENT  07/01/2016  . TONSILLECTOMY       Current Outpatient Prescriptions  Medication Sig Dispense Refill  . albuterol (PROVENTIL HFA;VENTOLIN HFA) 108 (90 BASE) MCG/ACT inhaler Inhale 2 puffs into the lungs every 6 (six) hours as needed for wheezing or shortness of breath.     Marland Kitchen aspirin EC 81 MG EC tablet Take 1 tablet (81 mg total) by mouth daily. 30 tablet 0  . atorvastatin (LIPITOR) 80 MG tablet Take 1 tablet (80 mg total) by mouth daily at 6 PM. 30 tablet 6  . blood glucose meter kit and supplies KIT Dispense based on patient and insurance preference. Use up to four times daily as directed. (FOR ICD-9 250.00, 250.01). 1 each 0  . BREO ELLIPTA 100-25 MCG/INH AEPB Inhale 1 puff into the lungs daily.  3  . clonazePAM (KLONOPIN) 1 MG tablet Take 1 mg by mouth 3 (three) times daily.     . fenofibrate (TRICOR) 145 MG tablet Take 1 tablet (145 mg total) by mouth daily. 30 tablet 6  .  FLUoxetine (PROZAC) 40 MG capsule Take 40 mg by mouth at bedtime.   1  . furosemide (LASIX) 40 MG tablet Take 1 tablet (40 mg total) by mouth daily. 30 tablet 6  . irbesartan (AVAPRO) 75 MG tablet Take 1 tablet (75 mg total) by mouth daily. 30 tablet 0  . levothyroxine (SYNTHROID, LEVOTHROID) 25 MCG tablet Take 25 mcg by mouth daily before breakfast.    . metFORMIN (GLUCOPHAGE) 1000 MG tablet Take 1 tablet (1,000 mg total) by mouth 2 (two) times daily with a meal. Held after heart cath. May start back from 07/04/2016.    . Multiple Vitamins-Minerals (MULTIVITAMIN WITH MINERALS) tablet Take 1 tablet by mouth daily.    . nicotine (NICODERM CQ - DOSED IN MG/24 HOURS) 14 mg/24hr patch Place 1 patch (14 mg total) onto the skin daily. 28 patch 0    . potassium chloride SA (KLOR-CON M20) 20 MEQ tablet Take 1 tablet (20 mEq total) by mouth daily. 30 tablet 6  . QUEtiapine (SEROQUEL) 25 MG tablet Take 25 mg by mouth at bedtime.    . ticagrelor (BRILINTA) 90 MG TABS tablet Take 1 tablet (90 mg total) by mouth 2 (two) times daily. 180 tablet 3  . VASCEPA 1 g CAPS Take 2 g by mouth 2 (two) times daily.  4   No current facility-administered medications for this visit.     Allergies:   Levaquin [levofloxacin] and Metoprolol    Social History:  The patient  reports that she has been smoking Cigarettes.  She has a 13.00 pack-year smoking history. She has never used smokeless tobacco. She reports that she uses drugs, including Marijuana. She reports that she does not drink alcohol.   Family History:  The patient's ***family history includes Diabetes in her mother; Healthy in her son; Heart attack (age of onset: 34) in her mother; Heart disease in her mother; Heart failure in her mother.    ROS:  General:no colds or fevers, no weight changes Skin:no rashes or ulcers HEENT:no blurred vision, no congestion CV:see HPI PUL:see HPI GI:no diarrhea constipation or melena, no indigestion GU:no hematuria, no dysuria MS:no joint pain, no claudication Neuro:no syncope, no lightheadedness Endo:no diabetes, no thyroid disease Wt Readings from Last 3 Encounters:  08/11/16 (!) 325 lb 2.9 oz (147.5 kg)  08/11/16 (!) 324 lb 6.4 oz (147.1 kg)  07/19/16 (!) 334 lb 12.8 oz (151.9 kg)     PHYSICAL EXAM: VS:  There were no vitals taken for this visit. , BMI There is no height or weight on file to calculate BMI. General:Pleasant affect, NAD Skin:Warm and dry, brisk capillary refill HEENT:normocephalic, sclera clear, mucus membranes moist Neck:supple, no JVD, no bruits  Heart:S1S2 RRR without murmur, gallup, rub or click Lungs:clear without rales, rhonchi, or wheezes DPO:EUMP, non tender, + BS, do not palpate liver spleen or masses Ext:no lower ext  edema, 2+ pedal pulses, 2+ radial pulses Neuro:alert and oriented, MAE, follows commands, + facial symmetry    EKG:  EKG is ordered today. The ekg ordered today demonstrates ***   Recent Labs: 07/02/2016: Hemoglobin 14.1 07/19/2016: BUN 11; Creatinine, Ser 0.82; Platelets 234; Potassium 4.0; Sodium 137    Lipid Panel    Component Value Date/Time   CHOL 225 (H) 07/02/2016 1046   TRIG 510 (H) 07/02/2016 1046   HDL 28 (L) 07/02/2016 1046   CHOLHDL 8.0 07/02/2016 1046   VLDL UNABLE TO CALCULATE IF TRIGLYCERIDE OVER 400 mg/dL 07/02/2016 1046   LDLCALC UNABLE TO CALCULATE IF  TRIGLYCERIDE OVER 400 mg/dL 07/02/2016 1046       Other studies Reviewed: Additional studies/ records that were reviewed today include: ***.   ASSESSMENT AND PLAN:  1.  *** 1. Recent NSTEMI pk troponin 2.49,  She has totalled RCA with Lt to right collaterals and 100% LCX with Stent placement.  DES   2. Chest pain, unsure if angina or anxiety.  Discussed with Dr. Johnsie Cancel DOD, stop minipress to see if improved BP will help prevent pain.  Follow up with Dr. [name] in 2-3 weeks to re-eval, though if pain increases she will need to go to ER.  She will also see psychiatrist today to assist with meds.  --will hold cardiac rehab for now.  - when Bp improves we would prefer avapro over minipress  3.  BP is low with episodes of hypotension she believes with near syncope.   4. HLD on statin high dose.    5. Anxiety see above.  Current medicines are reviewed with the patient today.  The patient Has no concerns regarding medicines.  The following changes have been made:  See above Labs/ tests ordered today include:see above  Disposition:   FU:  see above  Signed, Cecilie Kicks, NP  09/01/2016 4:04 PM    [facility] [place], [place] [address], [place] Phone: [phone]; Fax: [phone]

## 2016-09-02 ENCOUNTER — Ambulatory Visit: Payer: Medicare HMO | Admitting: Cardiology

## 2016-09-02 ENCOUNTER — Ambulatory Visit (HOSPITAL_COMMUNITY): Payer: Medicare HMO

## 2016-09-02 ENCOUNTER — Encounter (HOSPITAL_COMMUNITY): Payer: Medicare HMO

## 2016-09-05 ENCOUNTER — Ambulatory Visit (HOSPITAL_COMMUNITY): Payer: Medicare HMO

## 2016-09-05 ENCOUNTER — Encounter (HOSPITAL_COMMUNITY): Admission: RE | Admit: 2016-09-05 | Payer: Medicare HMO | Source: Ambulatory Visit

## 2016-09-07 ENCOUNTER — Encounter: Payer: Self-pay | Admitting: Cardiology

## 2016-09-07 ENCOUNTER — Ambulatory Visit (HOSPITAL_COMMUNITY): Payer: Medicare HMO

## 2016-09-07 ENCOUNTER — Encounter (HOSPITAL_COMMUNITY): Admission: RE | Admit: 2016-09-07 | Payer: Medicare HMO | Source: Ambulatory Visit

## 2016-09-09 ENCOUNTER — Encounter (HOSPITAL_COMMUNITY): Payer: Medicare HMO

## 2016-09-09 ENCOUNTER — Inpatient Hospital Stay (HOSPITAL_COMMUNITY): Admission: RE | Admit: 2016-09-09 | Payer: Medicare HMO | Source: Ambulatory Visit

## 2016-09-09 ENCOUNTER — Ambulatory Visit (HOSPITAL_COMMUNITY): Payer: Medicare HMO

## 2016-09-09 DIAGNOSIS — J449 Chronic obstructive pulmonary disease, unspecified: Secondary | ICD-10-CM | POA: Diagnosis not present

## 2016-09-12 ENCOUNTER — Ambulatory Visit (HOSPITAL_COMMUNITY): Payer: Medicare HMO

## 2016-09-12 ENCOUNTER — Encounter (HOSPITAL_COMMUNITY): Payer: Medicare HMO

## 2016-09-14 ENCOUNTER — Ambulatory Visit (HOSPITAL_COMMUNITY): Payer: Medicare HMO

## 2016-09-14 ENCOUNTER — Encounter (HOSPITAL_COMMUNITY): Payer: Medicare HMO

## 2016-09-16 ENCOUNTER — Inpatient Hospital Stay (HOSPITAL_COMMUNITY): Admission: RE | Admit: 2016-09-16 | Payer: Medicare HMO | Source: Ambulatory Visit

## 2016-09-16 ENCOUNTER — Encounter (HOSPITAL_COMMUNITY): Payer: Medicare HMO

## 2016-09-16 ENCOUNTER — Ambulatory Visit (HOSPITAL_COMMUNITY): Payer: Medicare HMO

## 2016-09-19 ENCOUNTER — Encounter (HOSPITAL_COMMUNITY): Payer: Medicare HMO

## 2016-09-19 ENCOUNTER — Ambulatory Visit (HOSPITAL_COMMUNITY): Payer: Medicare HMO

## 2016-09-21 ENCOUNTER — Ambulatory Visit (HOSPITAL_COMMUNITY): Payer: Medicare HMO

## 2016-09-21 ENCOUNTER — Encounter (HOSPITAL_COMMUNITY): Payer: Medicare HMO

## 2016-09-23 ENCOUNTER — Encounter (HOSPITAL_COMMUNITY): Payer: Medicare HMO

## 2016-09-23 ENCOUNTER — Ambulatory Visit (HOSPITAL_COMMUNITY): Payer: Medicare HMO

## 2016-09-26 ENCOUNTER — Ambulatory Visit (HOSPITAL_COMMUNITY): Payer: Medicare HMO

## 2016-09-26 ENCOUNTER — Encounter (HOSPITAL_COMMUNITY): Payer: Medicare HMO

## 2016-09-28 ENCOUNTER — Ambulatory Visit (HOSPITAL_COMMUNITY): Payer: Medicare HMO

## 2016-09-28 ENCOUNTER — Telehealth (HOSPITAL_COMMUNITY): Payer: Self-pay | Admitting: *Deleted

## 2016-09-28 ENCOUNTER — Encounter (HOSPITAL_COMMUNITY): Payer: Medicare HMO

## 2016-09-28 NOTE — Telephone Encounter (Signed)
Pt has not followed up with cardiologist office as previously discussed prior to beginning cardiac rehab.  Pt has not returned call from messages left. Will discharged pt from cardiac rehab at this time. Cherre Huger, BSN Cardiac and Training and development officer

## 2016-09-30 ENCOUNTER — Ambulatory Visit (HOSPITAL_COMMUNITY): Payer: Medicare HMO

## 2016-09-30 ENCOUNTER — Encounter (HOSPITAL_COMMUNITY): Payer: Medicare HMO

## 2016-10-03 ENCOUNTER — Encounter (HOSPITAL_COMMUNITY): Payer: Medicare HMO

## 2016-10-03 ENCOUNTER — Ambulatory Visit (HOSPITAL_COMMUNITY): Payer: Medicare HMO

## 2016-10-05 ENCOUNTER — Ambulatory Visit (HOSPITAL_COMMUNITY): Payer: Medicare HMO

## 2016-10-05 ENCOUNTER — Encounter (HOSPITAL_COMMUNITY): Payer: Medicare HMO

## 2016-10-07 ENCOUNTER — Ambulatory Visit (HOSPITAL_COMMUNITY): Payer: Medicare HMO

## 2016-10-07 ENCOUNTER — Encounter (HOSPITAL_COMMUNITY): Payer: Medicare HMO

## 2016-10-10 ENCOUNTER — Ambulatory Visit (HOSPITAL_COMMUNITY): Payer: Medicare HMO

## 2016-10-10 ENCOUNTER — Encounter (HOSPITAL_COMMUNITY): Payer: Medicare HMO

## 2016-10-10 DIAGNOSIS — J449 Chronic obstructive pulmonary disease, unspecified: Secondary | ICD-10-CM | POA: Diagnosis not present

## 2016-10-12 ENCOUNTER — Encounter (HOSPITAL_COMMUNITY): Payer: Medicare HMO

## 2016-10-12 ENCOUNTER — Ambulatory Visit (HOSPITAL_COMMUNITY): Payer: Medicare HMO

## 2016-10-14 ENCOUNTER — Encounter (HOSPITAL_COMMUNITY): Payer: Medicare HMO

## 2016-10-14 ENCOUNTER — Ambulatory Visit (HOSPITAL_COMMUNITY): Payer: Medicare HMO

## 2016-10-17 ENCOUNTER — Ambulatory Visit (HOSPITAL_COMMUNITY): Payer: Medicare HMO

## 2016-10-17 ENCOUNTER — Encounter (HOSPITAL_COMMUNITY): Payer: Medicare HMO

## 2016-10-19 ENCOUNTER — Ambulatory Visit (HOSPITAL_COMMUNITY): Payer: Medicare HMO

## 2016-10-19 ENCOUNTER — Encounter (HOSPITAL_COMMUNITY): Payer: Medicare HMO

## 2016-10-21 ENCOUNTER — Ambulatory Visit (HOSPITAL_COMMUNITY): Payer: Medicare HMO

## 2016-10-21 ENCOUNTER — Encounter (HOSPITAL_COMMUNITY): Payer: Medicare HMO

## 2016-10-24 ENCOUNTER — Encounter (HOSPITAL_COMMUNITY): Payer: Medicare HMO

## 2016-10-24 ENCOUNTER — Ambulatory Visit (HOSPITAL_COMMUNITY): Payer: Medicare HMO

## 2016-10-25 ENCOUNTER — Encounter (HOSPITAL_COMMUNITY): Payer: Self-pay

## 2016-10-25 ENCOUNTER — Other Ambulatory Visit: Payer: Self-pay

## 2016-10-25 ENCOUNTER — Emergency Department (HOSPITAL_COMMUNITY): Payer: Medicare HMO

## 2016-10-25 ENCOUNTER — Emergency Department (HOSPITAL_COMMUNITY)
Admission: EM | Admit: 2016-10-25 | Discharge: 2016-10-25 | Disposition: A | Discharge status: Home / Self Care | Payer: Medicare HMO | Attending: Emergency Medicine | Admitting: Emergency Medicine

## 2016-10-25 DIAGNOSIS — Z955 Presence of coronary angioplasty implant and graft: Secondary | ICD-10-CM | POA: Insufficient documentation

## 2016-10-25 DIAGNOSIS — R69 Illness, unspecified: Secondary | ICD-10-CM | POA: Diagnosis not present

## 2016-10-25 DIAGNOSIS — I252 Old myocardial infarction: Secondary | ICD-10-CM | POA: Insufficient documentation

## 2016-10-25 DIAGNOSIS — R079 Chest pain, unspecified: Secondary | ICD-10-CM | POA: Diagnosis not present

## 2016-10-25 DIAGNOSIS — I509 Heart failure, unspecified: Secondary | ICD-10-CM | POA: Insufficient documentation

## 2016-10-25 DIAGNOSIS — Z7982 Long term (current) use of aspirin: Secondary | ICD-10-CM | POA: Insufficient documentation

## 2016-10-25 DIAGNOSIS — E039 Hypothyroidism, unspecified: Secondary | ICD-10-CM | POA: Diagnosis not present

## 2016-10-25 DIAGNOSIS — J449 Chronic obstructive pulmonary disease, unspecified: Secondary | ICD-10-CM | POA: Diagnosis not present

## 2016-10-25 DIAGNOSIS — Z79899 Other long term (current) drug therapy: Secondary | ICD-10-CM | POA: Insufficient documentation

## 2016-10-25 DIAGNOSIS — Z5321 Procedure and treatment not carried out due to patient leaving prior to being seen by health care provider: Secondary | ICD-10-CM | POA: Diagnosis not present

## 2016-10-25 DIAGNOSIS — E119 Type 2 diabetes mellitus without complications: Secondary | ICD-10-CM | POA: Diagnosis not present

## 2016-10-25 DIAGNOSIS — F17210 Nicotine dependence, cigarettes, uncomplicated: Secondary | ICD-10-CM | POA: Diagnosis not present

## 2016-10-25 DIAGNOSIS — I110 Hypertensive heart disease with heart failure: Secondary | ICD-10-CM | POA: Insufficient documentation

## 2016-10-25 DIAGNOSIS — Z7984 Long term (current) use of oral hypoglycemic drugs: Secondary | ICD-10-CM | POA: Diagnosis not present

## 2016-10-25 LAB — BASIC METABOLIC PANEL
Anion gap: 9 (ref 5–15)
BUN: 7 mg/dL (ref 6–20)
CHLORIDE: 106 mmol/L (ref 101–111)
CO2: 21 mmol/L — AB (ref 22–32)
CREATININE: 0.78 mg/dL (ref 0.44–1.00)
Calcium: 8.5 mg/dL — ABNORMAL LOW (ref 8.9–10.3)
GFR calc Af Amer: >60 mL/min (ref >60)
GFR calc non Af Amer: >60 mL/min (ref >60)
Glucose, Bld: 154 mg/dL — ABNORMAL HIGH (ref 65–99)
Potassium: 3.9 mmol/L (ref 3.5–5.1)
Sodium: 136 mmol/L (ref 135–145)

## 2016-10-25 LAB — CBC
HCT: 42.1 % (ref 36.0–46.0)
Hemoglobin: 14.7 g/dL (ref 12.0–15.0)
MCH: 31.3 pg (ref 26.0–34.0)
MCHC: 34.9 g/dL (ref 30.0–36.0)
MCV: 89.6 fL (ref 78.0–100.0)
PLATELETS: 256 10*3/uL (ref 150–400)
RBC: 4.7 MIL/uL (ref 3.87–5.11)
RDW: 12.9 % (ref 11.5–15.5)
WBC: 9.5 10*3/uL (ref 4.0–10.5)

## 2016-10-25 LAB — I-STAT TROPONIN, ED: Troponin i, poc: 0 ng/mL (ref 0.00–0.08)

## 2016-10-25 NOTE — ED Notes (Signed)
PT outside of waiting area to smoke a cigarette. Advised that was not the best thing to do.

## 2016-10-25 NOTE — ED Triage Notes (Signed)
Pt presents for evaluation of R sided neck/arm pain with radiation to central chest. Pt reports cardiac hx with stent placement, states this feels different. States ongoing for 3 days but worsened this AM. Pt reports weakness.

## 2016-10-25 NOTE — ED Notes (Signed)
Pt to nurse first, set stickers and blood pressure cuff on table and walked out.

## 2016-10-26 ENCOUNTER — Ambulatory Visit (HOSPITAL_COMMUNITY): Payer: Medicare HMO

## 2016-10-26 ENCOUNTER — Encounter (HOSPITAL_COMMUNITY): Payer: Medicare HMO

## 2016-10-28 ENCOUNTER — Ambulatory Visit (HOSPITAL_COMMUNITY): Payer: Medicare HMO

## 2016-10-28 ENCOUNTER — Encounter (HOSPITAL_COMMUNITY): Payer: Medicare HMO

## 2016-10-30 ENCOUNTER — Encounter: Payer: Self-pay | Admitting: Nurse Practitioner

## 2016-10-31 ENCOUNTER — Encounter (HOSPITAL_COMMUNITY): Payer: Medicare HMO

## 2016-10-31 ENCOUNTER — Ambulatory Visit (HOSPITAL_COMMUNITY): Payer: Medicare HMO

## 2016-11-02 ENCOUNTER — Encounter: Payer: Self-pay | Admitting: Cardiology

## 2016-11-02 ENCOUNTER — Ambulatory Visit (INDEPENDENT_AMBULATORY_CARE_PROVIDER_SITE_OTHER): Payer: Medicare HMO | Admitting: Cardiology

## 2016-11-02 ENCOUNTER — Encounter (HOSPITAL_COMMUNITY): Payer: Medicare HMO

## 2016-11-02 ENCOUNTER — Ambulatory Visit (HOSPITAL_COMMUNITY): Payer: Medicare HMO

## 2016-11-02 VITALS — BP 110/74 | HR 86 | Ht 67.0 in | Wt 326.0 lb

## 2016-11-02 DIAGNOSIS — R0789 Other chest pain: Secondary | ICD-10-CM | POA: Diagnosis not present

## 2016-11-02 DIAGNOSIS — Z720 Tobacco use: Secondary | ICD-10-CM | POA: Diagnosis not present

## 2016-11-02 DIAGNOSIS — I2510 Atherosclerotic heart disease of native coronary artery without angina pectoris: Secondary | ICD-10-CM | POA: Diagnosis not present

## 2016-11-02 DIAGNOSIS — I252 Old myocardial infarction: Secondary | ICD-10-CM | POA: Diagnosis not present

## 2016-11-02 NOTE — Progress Notes (Signed)
Cardiology Office Note:    Date:  11/02/2016   ID:  Kaitlyn Chambers, DOB [birth year], MRN [id number]  PCP:  Kaitlyn Peers, MD  Cardiologist:  Kaitlyn Furbish, MD   Referring MD: Kaitlyn Lei, MD     History of Present Illness:    Kaitlyn Chambers is a 36 y.o. female with non-ST elevation myocardial infarction with cardiac catheterization demonstrated occluded right coronary artery with collateral blood flow, occluded circumflex artery with successful drug eluting stent placement, morbid obesity, marijuana use, diabetes, hypertension.  At the end of April on 10/25/16 she went to the emergency room complaining of right-sided neck/arm pain with radiation to the central chest. It went on for 3 days. She was weak. She met outside to smoke from the ER and took off her blood pressure cuff afterwards and left. Troponin negative. EKG negative.  Today we discussed prevention. She seems quite motivated to try to take care of herself and her 63 year old. She states the neighbor said she lives and is not safe to walk-in and less you have a pit bull or a pistol, both of which she states she has. She is interested in trying cardiac rehabilitation because she is nervous about going to the gym on her own. She's had anxiety issues in the past.  We talked about her chest pain which was quite atypical, continuous, lasting a few days duration. Likely musculoskeletal.  She also told me about her mother who she had to bury at age 76. Her mother had heart bull peripheral vascular disease and had to have amputation she remembers.    Past Medical History:  Diagnosis Date  . Anxiety   . Bipolar 1 disorder, manic, full remission ([place])   . CHF (congestive heart failure) ([place])   . COPD (chronic obstructive pulmonary disease) ([place])   . Coronary artery disease   . Depression   . GERD (gastroesophageal reflux disease)   . Gout   . High cholesterol   . Hypertension   . Hypothyroidism   . Metabolic syndrome   .  NSTEMI (non-ST elevated myocardial infarction) ([place]) 07/01/2016  . On home oxygen therapy    "available; don't use it" (07/01/2016)  . PCOS (polycystic ovarian syndrome)   . Stomach ulcer   . Type 2 diabetes, diet controlled ([place])     Past Surgical History:  Procedure Laterality Date  . CARDIAC CATHETERIZATION N/A 07/01/2016   Procedure: LEFT HEART CATH AND CORONARY ANGIOGRAPHY;  Surgeon: Kaitlyn Blanks, MD;  Location: [facility];  Service: Cardiovascular;  Laterality: N/A;  . CARDIAC CATHETERIZATION N/A 07/01/2016   Procedure: Coronary Stent Intervention;  Surgeon: Kaitlyn Blanks, MD;  Location: [facility];  Service: Cardiovascular;  Laterality: N/A;  . CARPAL TUNNEL RELEASE Bilateral   . CERVICAL BIOPSY  W/ LOOP ELECTRODE EXCISION  05/2016   "precancerous cells"  . CORONARY ANGIOPLASTY WITH STENT PLACEMENT  07/01/2016  . TONSILLECTOMY      Current Medications: Current Meds  Medication Sig  . albuterol (PROVENTIL HFA;VENTOLIN HFA) 108 (90 BASE) MCG/ACT inhaler Inhale 2 puffs into the lungs every 6 (six) hours as needed for wheezing or shortness of breath.   Marland Kitchen aspirin EC 81 MG EC tablet Take 1 tablet (81 mg total) by mouth daily.  Marland Kitchen atorvastatin (LIPITOR) 80 MG tablet Take 1 tablet (80 mg total) by mouth daily at 6 PM.  . blood glucose meter kit and supplies KIT Dispense based on patient and insurance preference. Use up to four times daily  as directed. (FOR ICD-9 250.00, 250.01).  Marland Kitchen BREO ELLIPTA 100-25 MCG/INH AEPB Inhale 1 puff into the lungs daily.  . clonazePAM (KLONOPIN) 1 MG tablet Take 1 mg by mouth 3 (three) times daily.   . fenofibrate (TRICOR) 145 MG tablet Take 1 tablet (145 mg total) by mouth daily.  Marland Kitchen FLUoxetine (PROZAC) 40 MG capsule Take 40 mg by mouth at bedtime.   . irbesartan (AVAPRO) 75 MG tablet Take 1 tablet (75 mg total) by mouth daily.  Marland Kitchen levothyroxine (SYNTHROID, LEVOTHROID) 25 MCG tablet Take 25 mcg by mouth daily before  breakfast.  . metFORMIN (GLUCOPHAGE) 1000 MG tablet Take 1,000 mg by mouth 2 (two) times daily with a meal.  . Multiple Vitamins-Minerals (MULTIVITAMIN WITH MINERALS) tablet Take 1 tablet by mouth daily.  . potassium chloride SA (K-DUR,KLOR-CON) 20 MEQ tablet Take 20 mEq by mouth daily.  . QUEtiapine (SEROQUEL) 25 MG tablet Take 25 mg by mouth at bedtime.  . ticagrelor (BRILINTA) 90 MG TABS tablet Take 1 tablet (90 mg total) by mouth 2 (two) times daily.  Marland Kitchen VASCEPA 1 g CAPS Take 2 g by mouth 2 (two) times daily.     Allergies:   Levaquin [levofloxacin] and Metoprolol   Social History   Social History  . Marital status: Divorced    Spouse name: N/A  . Number of children: N/A  . Years of education: N/A   Social History Main Topics  . Smoking status: Current Every Day Smoker    Packs/day: 0.50    Years: 26.00    Types: Cigarettes  . Smokeless tobacco: Never Used  . Alcohol use No  . Drug use: Yes    Types: Marijuana     Comment: 06/28/2016 "couple times/week; if that"  . Sexual activity: Yes    Birth control/ protection: None   Other Topics Concern  . None   Social History Narrative  . None     Family History: The patient's family history includes Diabetes in her mother; Healthy in her son; Heart attack (age of onset: 61) in her mother; Heart disease in her mother; Heart failure in her mother. ROS:   Please see the history of present illness.     All other systems reviewed and are negative.  EKGs/Labs/Other Studies Reviewed:    The following studies were reviewed today: Prior hospital records, cardiac catheterization, ECGs revealed  EKG:  No EKG today  Cardiac catheterization: 07/01/16 Kaitlyn Blanks, MD 07/01/2016 Routine  Narrative & Impression      The left ventricular ejection fraction is 50-55% by visual estimate.  The left ventricular systolic function is normal.  LV end diastolic pressure is normal.  There is no mitral valve  regurgitation.  Mid RCA lesion, 100 %stenosed.  A STENT SYNERGY DES 3X20 drug eluting stent was successfully placed.  Prox Cx to Mid Cx lesion, 100 %stenosed.  Post intervention, there is a 0% residual stenosis.  1. Acute lateral MI secondary to occluded Circumflex (NSTEMI) 2. Chronic occlusion proximal RCA with filling of distal vessel from left to right collaterals.  3. Preserved LV systolic function 4. Successful PTCA/DES x 1 mid Circumflex.  Recommendations: DAPT for one year with ASA/Brilinta. Continue statin. Beta blocker as tolerated.     Recent Labs: 10/25/2016: BUN 7; Creatinine, Ser 0.78; Hemoglobin 14.7; Platelets 256; Potassium 3.9; Sodium 136   Recent Lipid Panel    Component Value Date/Time   CHOL 225 (H) 07/02/2016 1046   TRIG 510 (H) 07/02/2016 1046  HDL 28 (L) 07/02/2016 1046   CHOLHDL 8.0 07/02/2016 1046   VLDL UNABLE TO CALCULATE IF TRIGLYCERIDE OVER 400 mg/dL 07/02/2016 1046   LDLCALC UNABLE TO CALCULATE IF TRIGLYCERIDE OVER 400 mg/dL 07/02/2016 1046    Physical Exam:    VS:  BP 110/74   Pulse 86   Ht _0  (1.702 m)   Wt (!) 326 lb (147.9 kg)   LMP 10/17/2016 (Exact Date)   BMI 51.06 kg/m     Wt Readings from Last 3 Encounters:  11/02/16 (!) 326 lb (147.9 kg)  08/11/16 (!) 325 lb 2.9 oz (147.5 kg)  08/11/16 (!) 324 lb 6.4 oz (147.1 kg)     GEN:  Well nourished, well developed in no acute distress HEENT: Normal NECK: No JVD; No carotid bruits LYMPHATICS: No lymphadenopathy CARDIAC: RRR, no murmurs, rubs, gallops RESPIRATORY:  Clear to auscultation without rales, wheezing or rhonchi  ABDOMEN: Soft, non-tender, non-distended MUSCULOSKELETAL:  No edema; No deformity  SKIN: Warm and dry NEUROLOGIC:  Alert and oriented x 3 PSYCHIATRIC:  Normal affect   ASSESSMENT:    1. History of MI (myocardial infarction)   2. Coronary artery disease involving native coronary artery of native heart without angina pectoris   3. Atypical chest pain    4. Morbid obesity ([place])   5. Tobacco abuse    PLAN:    In order of problems listed above:  History of myocardial infarction/coronary artery disease  - DES to circumflex artery on 07/01/16  - Overall doing well. Normal EF.  - Cardiac rehabilitation.  - Thankfully, distal pulses are palpated. No evidence of peripheral vascular disease.  Essential hypertension  - Blood pressure during hospital was low.  Smoker  - During hospital stay, encouraged tobacco and marijuana cessation.  - We discussed as well.  Diabetes with hypertension  - Per primary team.  Morbid obesity  - Continue to encourage weight loss.  We will refer to cardiac rehabilitation.    Medication Adjustments/Labs and Tests Ordered: Current medicines are reviewed at length with the patient today.  Concerns regarding medicines are outlined above. Labs and tests ordered and medication changes are outlined in the patient instructions below:  Patient Instructions  Medication Instructions:  Your physician recommends that you continue on your current medications as directed. Please refer to the Current Medication list given to you today.   Labwork: None  Testing/Procedures: None  Follow-Up: You have been referred to CARDIAC REHABILITATION. You will receive a call to arrange these appointments.  Your physician wants you to follow-up in: 4 months with Dr. [name]' assistant, Mickel Baas. You will receive a reminder letter in the mail two months in advance. If you don't receive a letter, please call our office to schedule the follow-up appointment.   Any Other Special Instructions Will Be Listed Below (If Applicable).     If you need a refill on your cardiac medications before your next appointment, please call your pharmacy.      Signed, Kaitlyn Furbish, MD  11/02/2016 2:31 PM    [place]

## 2016-11-02 NOTE — Patient Instructions (Signed)
Medication Instructions:  Your physician recommends that you continue on your current medications as directed. Please refer to the Current Medication list given to you today.   Labwork: None  Testing/Procedures: None  Follow-Up: You have been referred to CARDIAC REHABILITATION. You will receive a call to arrange these appointments.  Your physician wants you to follow-up in: 4 months with Dr. [name]' assistant, Mickel Baas. You will receive a reminder letter in the mail two months in advance. If you don't receive a letter, please call our office to schedule the follow-up appointment.   Any Other Special Instructions Will Be Listed Below (If Applicable).     If you need a refill on your cardiac medications before your next appointment, please call your pharmacy.

## 2016-11-03 ENCOUNTER — Telehealth (HOSPITAL_COMMUNITY): Payer: Self-pay | Admitting: Family Medicine

## 2016-11-03 NOTE — Telephone Encounter (Signed)
Patient insurances are active and benefits verified. Patient has Radio broadcast assistant and Medicaid-secondary. [employer] Medicare - primary - $45.00 co-payment, no deductible, out of pocket $5900/$186.12 have been met, 0% co-insurance, no pre-authorization and no limit on visit. Passport/reference [phone]. Medicaid-secondary- no co-payment, no deductible no out of pocket, no co-insurance, no pre-authorization and no limit on visit. Passport/reference # [id number].

## 2016-11-04 ENCOUNTER — Ambulatory Visit (HOSPITAL_COMMUNITY): Payer: Medicare HMO

## 2016-11-07 ENCOUNTER — Ambulatory Visit (HOSPITAL_COMMUNITY): Payer: Medicare HMO

## 2016-11-09 ENCOUNTER — Ambulatory Visit (HOSPITAL_COMMUNITY): Payer: Medicare HMO

## 2016-11-09 DIAGNOSIS — J449 Chronic obstructive pulmonary disease, unspecified: Secondary | ICD-10-CM | POA: Diagnosis not present

## 2016-11-10 ENCOUNTER — Telehealth (HOSPITAL_COMMUNITY): Payer: Self-pay

## 2016-11-10 NOTE — Telephone Encounter (Signed)
I called patient and left message on patient voicemail to call office to schedule for orientation and cardiac rehab classes. I left my contact information on patient voicemail to return my call.

## 2016-11-11 ENCOUNTER — Ambulatory Visit (HOSPITAL_COMMUNITY): Payer: Medicare HMO

## 2016-11-14 ENCOUNTER — Ambulatory Visit (HOSPITAL_COMMUNITY): Payer: Medicare HMO

## 2016-11-16 ENCOUNTER — Ambulatory Visit (HOSPITAL_COMMUNITY): Payer: Medicare HMO

## 2016-11-17 ENCOUNTER — Telehealth (HOSPITAL_COMMUNITY): Payer: Self-pay

## 2016-11-17 NOTE — Telephone Encounter (Signed)
2nd phone call* I called patient and left message on patient voicemail to call office to schedule for orientation and cardiac rehab classes. I left my contact information on patient voicemail to return my call.

## 2016-11-18 ENCOUNTER — Ambulatory Visit (HOSPITAL_COMMUNITY): Payer: Medicare HMO

## 2016-11-21 ENCOUNTER — Ambulatory Visit (HOSPITAL_COMMUNITY): Payer: Medicare HMO

## 2016-11-22 ENCOUNTER — Encounter (HOSPITAL_COMMUNITY): Payer: Self-pay

## 2016-11-22 ENCOUNTER — Telehealth (HOSPITAL_COMMUNITY): Payer: Self-pay

## 2016-11-22 NOTE — Telephone Encounter (Signed)
*  Final notice* Patient has been called 2X and has not responded. I have mailed patient a brochure about cardiac rehab. If patient does not respond, referral will be canceled.

## 2016-11-23 ENCOUNTER — Ambulatory Visit (HOSPITAL_COMMUNITY): Payer: Medicare HMO

## 2016-11-25 ENCOUNTER — Ambulatory Visit (HOSPITAL_COMMUNITY): Payer: Medicare HMO

## 2016-11-28 ENCOUNTER — Ambulatory Visit (HOSPITAL_COMMUNITY): Payer: Medicare HMO

## 2016-11-30 ENCOUNTER — Ambulatory Visit (HOSPITAL_COMMUNITY): Payer: Medicare HMO

## 2016-12-02 ENCOUNTER — Ambulatory Visit (HOSPITAL_COMMUNITY): Payer: Medicare HMO

## 2016-12-05 ENCOUNTER — Ambulatory Visit (HOSPITAL_COMMUNITY): Payer: Medicare HMO

## 2016-12-07 ENCOUNTER — Ambulatory Visit (HOSPITAL_COMMUNITY): Payer: Medicare HMO

## 2016-12-09 ENCOUNTER — Ambulatory Visit (HOSPITAL_COMMUNITY): Payer: Medicare HMO

## 2016-12-10 DIAGNOSIS — J449 Chronic obstructive pulmonary disease, unspecified: Secondary | ICD-10-CM | POA: Diagnosis not present

## 2016-12-12 ENCOUNTER — Ambulatory Visit (HOSPITAL_COMMUNITY): Payer: Medicare HMO

## 2016-12-13 DIAGNOSIS — R0789 Other chest pain: Secondary | ICD-10-CM | POA: Diagnosis not present

## 2016-12-14 ENCOUNTER — Ambulatory Visit (HOSPITAL_COMMUNITY): Payer: Medicare HMO

## 2016-12-16 ENCOUNTER — Ambulatory Visit (HOSPITAL_COMMUNITY): Payer: Medicare HMO

## 2016-12-19 ENCOUNTER — Ambulatory Visit (HOSPITAL_COMMUNITY): Payer: Medicare HMO

## 2016-12-21 ENCOUNTER — Ambulatory Visit (HOSPITAL_COMMUNITY): Payer: Medicare HMO

## 2016-12-23 ENCOUNTER — Ambulatory Visit (HOSPITAL_COMMUNITY): Payer: Medicare HMO

## 2016-12-26 ENCOUNTER — Ambulatory Visit (HOSPITAL_COMMUNITY): Payer: Medicare HMO

## 2016-12-28 ENCOUNTER — Ambulatory Visit (HOSPITAL_COMMUNITY): Payer: Medicare HMO

## 2016-12-30 ENCOUNTER — Ambulatory Visit (HOSPITAL_COMMUNITY): Payer: Medicare HMO

## 2017-01-02 ENCOUNTER — Ambulatory Visit (HOSPITAL_COMMUNITY): Payer: Medicare HMO

## 2017-01-04 ENCOUNTER — Ambulatory Visit (HOSPITAL_COMMUNITY): Payer: Medicare HMO

## 2017-01-09 DIAGNOSIS — J449 Chronic obstructive pulmonary disease, unspecified: Secondary | ICD-10-CM | POA: Diagnosis not present

## 2017-01-19 ENCOUNTER — Other Ambulatory Visit: Payer: Self-pay | Admitting: Cardiology

## 2017-01-19 NOTE — Telephone Encounter (Signed)
atorvastatin (LIPITOR) 80 MG tablet  Medication  Date: 01/19/2017 Department: [address] Office Ordering/Authorizing: Jerline Pain, MD  Order Providers   Prescribing Provider Encounter Provider  Jerline Pain, MD Isaiah Serge, NP  Medication Detail    Disp Refills Start End   atorvastatin (LIPITOR) 80 MG tablet 30 tablet 1 01/19/2017    Sig - Route: Take 1 tablet (80 mg total) by mouth daily at 6 PM. Please call and schedule September follow up [phone] - Oral   Sent to pharmacy as: atorvastatin (LIPITOR) 80 MG tablet   E-Prescribing Status: Receipt confirmed by pharmacy (01/19/2017 7:53 AM EDT)   Pharmacy   [id number] - [place], [place] Encounter  Priority and Order Details   Already done

## 2017-02-09 DIAGNOSIS — J449 Chronic obstructive pulmonary disease, unspecified: Secondary | ICD-10-CM | POA: Diagnosis not present

## 2017-02-24 DIAGNOSIS — Z3202 Encounter for pregnancy test, result negative: Secondary | ICD-10-CM | POA: Diagnosis not present

## 2017-02-27 ENCOUNTER — Telehealth: Payer: Self-pay | Admitting: Cardiology

## 2017-02-27 MED ORDER — NITROGLYCERIN 0.4 MG SL SUBL: 0.4000 mg | SUBLINGUAL_TABLET | SUBLINGUAL | 99 refills | Status: DC | PRN

## 2017-02-27 NOTE — Telephone Encounter (Signed)
OK per Dr Skains   

## 2017-02-27 NOTE — Telephone Encounter (Signed)
Pt calling requesting a refill on Nitroglycerin. I do not see this medication on pt's med list. Please advise

## 2017-03-12 DIAGNOSIS — J449 Chronic obstructive pulmonary disease, unspecified: Secondary | ICD-10-CM | POA: Diagnosis not present

## 2017-03-14 ENCOUNTER — Emergency Department (HOSPITAL_COMMUNITY): Payer: Medicare HMO

## 2017-03-14 ENCOUNTER — Observation Stay (HOSPITAL_COMMUNITY)
Admission: EM | Admit: 2017-03-14 | Discharge: 2017-03-15 | Disposition: A | Discharge status: Home / Self Care | Payer: Medicare HMO | Attending: Internal Medicine | Admitting: Internal Medicine

## 2017-03-14 ENCOUNTER — Encounter (HOSPITAL_COMMUNITY): Payer: Self-pay | Admitting: Emergency Medicine

## 2017-03-14 DIAGNOSIS — F410 Panic disorder [episodic paroxysmal anxiety] without agoraphobia: Secondary | ICD-10-CM | POA: Insufficient documentation

## 2017-03-14 DIAGNOSIS — E02 Subclinical iodine-deficiency hypothyroidism: Secondary | ICD-10-CM | POA: Insufficient documentation

## 2017-03-14 DIAGNOSIS — E282 Polycystic ovarian syndrome: Secondary | ICD-10-CM | POA: Insufficient documentation

## 2017-03-14 DIAGNOSIS — Z955 Presence of coronary angioplasty implant and graft: Secondary | ICD-10-CM | POA: Insufficient documentation

## 2017-03-14 DIAGNOSIS — E876 Hypokalemia: Secondary | ICD-10-CM | POA: Diagnosis not present

## 2017-03-14 DIAGNOSIS — E119 Type 2 diabetes mellitus without complications: Secondary | ICD-10-CM | POA: Diagnosis not present

## 2017-03-14 DIAGNOSIS — I509 Heart failure, unspecified: Secondary | ICD-10-CM | POA: Insufficient documentation

## 2017-03-14 DIAGNOSIS — I2582 Chronic total occlusion of coronary artery: Secondary | ICD-10-CM | POA: Insufficient documentation

## 2017-03-14 DIAGNOSIS — I252 Old myocardial infarction: Secondary | ICD-10-CM | POA: Diagnosis not present

## 2017-03-14 DIAGNOSIS — Z7982 Long term (current) use of aspirin: Secondary | ICD-10-CM | POA: Insufficient documentation

## 2017-03-14 DIAGNOSIS — E785 Hyperlipidemia, unspecified: Secondary | ICD-10-CM | POA: Insufficient documentation

## 2017-03-14 DIAGNOSIS — D72829 Elevated white blood cell count, unspecified: Secondary | ICD-10-CM | POA: Insufficient documentation

## 2017-03-14 DIAGNOSIS — I2510 Atherosclerotic heart disease of native coronary artery without angina pectoris: Secondary | ICD-10-CM | POA: Insufficient documentation

## 2017-03-14 DIAGNOSIS — Z79899 Other long term (current) drug therapy: Secondary | ICD-10-CM | POA: Insufficient documentation

## 2017-03-14 DIAGNOSIS — R112 Nausea with vomiting, unspecified: Secondary | ICD-10-CM | POA: Insufficient documentation

## 2017-03-14 DIAGNOSIS — F17210 Nicotine dependence, cigarettes, uncomplicated: Secondary | ICD-10-CM | POA: Diagnosis not present

## 2017-03-14 DIAGNOSIS — F319 Bipolar disorder, unspecified: Secondary | ICD-10-CM | POA: Diagnosis present

## 2017-03-14 DIAGNOSIS — J449 Chronic obstructive pulmonary disease, unspecified: Secondary | ICD-10-CM | POA: Diagnosis not present

## 2017-03-14 DIAGNOSIS — I110 Hypertensive heart disease with heart failure: Secondary | ICD-10-CM | POA: Diagnosis not present

## 2017-03-14 DIAGNOSIS — R61 Generalized hyperhidrosis: Secondary | ICD-10-CM | POA: Insufficient documentation

## 2017-03-14 DIAGNOSIS — R072 Precordial pain: Principal | ICD-10-CM | POA: Insufficient documentation

## 2017-03-14 DIAGNOSIS — E118 Type 2 diabetes mellitus with unspecified complications: Secondary | ICD-10-CM

## 2017-03-14 DIAGNOSIS — K219 Gastro-esophageal reflux disease without esophagitis: Secondary | ICD-10-CM | POA: Diagnosis not present

## 2017-03-14 DIAGNOSIS — R0789 Other chest pain: Secondary | ICD-10-CM | POA: Diagnosis not present

## 2017-03-14 DIAGNOSIS — Z888 Allergy status to other drugs, medicaments and biological substances status: Secondary | ICD-10-CM | POA: Insufficient documentation

## 2017-03-14 DIAGNOSIS — M109 Gout, unspecified: Secondary | ICD-10-CM | POA: Diagnosis not present

## 2017-03-14 DIAGNOSIS — E7800 Pure hypercholesterolemia, unspecified: Secondary | ICD-10-CM | POA: Insufficient documentation

## 2017-03-14 DIAGNOSIS — R0602 Shortness of breath: Secondary | ICD-10-CM | POA: Diagnosis not present

## 2017-03-14 DIAGNOSIS — R079 Chest pain, unspecified: Secondary | ICD-10-CM | POA: Diagnosis not present

## 2017-03-14 DIAGNOSIS — Z881 Allergy status to other antibiotic agents status: Secondary | ICD-10-CM | POA: Insufficient documentation

## 2017-03-14 DIAGNOSIS — Z6841 Body Mass Index (BMI) 40.0 and over, adult: Secondary | ICD-10-CM | POA: Diagnosis not present

## 2017-03-14 DIAGNOSIS — Z8249 Family history of ischemic heart disease and other diseases of the circulatory system: Secondary | ICD-10-CM | POA: Insufficient documentation

## 2017-03-14 DIAGNOSIS — I493 Ventricular premature depolarization: Secondary | ICD-10-CM | POA: Insufficient documentation

## 2017-03-14 DIAGNOSIS — Z8719 Personal history of other diseases of the digestive system: Secondary | ICD-10-CM | POA: Insufficient documentation

## 2017-03-14 DIAGNOSIS — Z9981 Dependence on supplemental oxygen: Secondary | ICD-10-CM | POA: Insufficient documentation

## 2017-03-14 LAB — BASIC METABOLIC PANEL
Anion gap: 7 (ref 5–15)
BUN: 11 mg/dL (ref 6–20)
CO2: 23 mmol/L (ref 22–32)
Calcium: 8.8 mg/dL — ABNORMAL LOW (ref 8.9–10.3)
Chloride: 106 mmol/L (ref 101–111)
Creatinine, Ser: 1.07 mg/dL — ABNORMAL HIGH (ref 0.44–1.00)
GFR calc Af Amer: >60 mL/min (ref >60)
GFR calc non Af Amer: >60 mL/min (ref >60)
Glucose, Bld: 97 mg/dL (ref 65–99)
Potassium: 3.2 mmol/L — ABNORMAL LOW (ref 3.5–5.1)
Sodium: 136 mmol/L (ref 135–145)

## 2017-03-14 LAB — CBC
HCT: 38.7 % (ref 36.0–46.0)
Hemoglobin: 13.5 g/dL (ref 12.0–15.0)
MCH: 30.8 pg (ref 26.0–34.0)
MCHC: 34.9 g/dL (ref 30.0–36.0)
MCV: 88.4 fL (ref 78.0–100.0)
Platelets: 241 10*3/uL (ref 150–400)
RBC: 4.38 MIL/uL (ref 3.87–5.11)
RDW: 12.8 % (ref 11.5–15.5)
WBC: 11.4 10*3/uL — ABNORMAL HIGH (ref 4.0–10.5)

## 2017-03-14 LAB — I-STAT TROPONIN, ED: Troponin i, poc: 0.01 ng/mL (ref 0.00–0.08)

## 2017-03-14 MED ORDER — LORAZEPAM 2 MG/ML IJ SOLN
1.0000 mg | Freq: Once | INTRAMUSCULAR | Status: AC
  Administered 2017-03-14: 1 mg via INTRAVENOUS
  Filled 2017-03-14: qty 1

## 2017-03-14 MED ORDER — ONDANSETRON HCL 4 MG/2ML IJ SOLN
4.0000 mg | Freq: Once | INTRAMUSCULAR | Status: AC
  Administered 2017-03-14: 4 mg via INTRAVENOUS
  Filled 2017-03-14: qty 2

## 2017-03-14 MED ORDER — KETOROLAC TROMETHAMINE 15 MG/ML IJ SOLN
15.0000 mg | Freq: Once | INTRAMUSCULAR | Status: AC
  Administered 2017-03-14: 15 mg via INTRAVENOUS
  Filled 2017-03-14: qty 1

## 2017-03-14 NOTE — ED Triage Notes (Signed)
Per [facility], Pt started having CP 2 hours ago. Pt reports the pain goes into her neck, down her left arm and in between her shoulder blades. Pt has hx of MI December 2017, had a stent placed. Pt states the pain feels the same as when she had an MI. Pt took 2 nitro and 324 ASA at home. Pt received 250 ml NS and 2 additional nitro en route with EMS. Pt reported it helped the pain but now the pain is increasing. Pt alert and oriented. Pt feeling nauseous currently

## 2017-03-14 NOTE — ED Notes (Signed)
Patient transported to X-ray 

## 2017-03-15 ENCOUNTER — Encounter (HOSPITAL_COMMUNITY): Payer: Self-pay | Admitting: Cardiology

## 2017-03-15 ENCOUNTER — Observation Stay (HOSPITAL_BASED_OUTPATIENT_CLINIC_OR_DEPARTMENT_OTHER): Payer: Medicare HMO

## 2017-03-15 ENCOUNTER — Telehealth: Payer: Self-pay | Admitting: Nurse Practitioner

## 2017-03-15 DIAGNOSIS — I25119 Atherosclerotic heart disease of native coronary artery with unspecified angina pectoris: Secondary | ICD-10-CM | POA: Diagnosis not present

## 2017-03-15 DIAGNOSIS — D72829 Elevated white blood cell count, unspecified: Secondary | ICD-10-CM | POA: Diagnosis present

## 2017-03-15 DIAGNOSIS — E785 Hyperlipidemia, unspecified: Secondary | ICD-10-CM | POA: Diagnosis present

## 2017-03-15 DIAGNOSIS — E119 Type 2 diabetes mellitus without complications: Secondary | ICD-10-CM | POA: Diagnosis not present

## 2017-03-15 DIAGNOSIS — E118 Type 2 diabetes mellitus with unspecified complications: Secondary | ICD-10-CM | POA: Diagnosis not present

## 2017-03-15 DIAGNOSIS — R69 Illness, unspecified: Secondary | ICD-10-CM | POA: Diagnosis not present

## 2017-03-15 DIAGNOSIS — I2510 Atherosclerotic heart disease of native coronary artery without angina pectoris: Secondary | ICD-10-CM | POA: Diagnosis not present

## 2017-03-15 DIAGNOSIS — R072 Precordial pain: Secondary | ICD-10-CM | POA: Diagnosis not present

## 2017-03-15 DIAGNOSIS — R079 Chest pain, unspecified: Secondary | ICD-10-CM

## 2017-03-15 DIAGNOSIS — R112 Nausea with vomiting, unspecified: Secondary | ICD-10-CM | POA: Diagnosis not present

## 2017-03-15 DIAGNOSIS — I110 Hypertensive heart disease with heart failure: Secondary | ICD-10-CM | POA: Diagnosis not present

## 2017-03-15 DIAGNOSIS — F319 Bipolar disorder, unspecified: Secondary | ICD-10-CM | POA: Diagnosis not present

## 2017-03-15 DIAGNOSIS — J449 Chronic obstructive pulmonary disease, unspecified: Secondary | ICD-10-CM | POA: Diagnosis not present

## 2017-03-15 DIAGNOSIS — R61 Generalized hyperhidrosis: Secondary | ICD-10-CM | POA: Diagnosis not present

## 2017-03-15 DIAGNOSIS — I509 Heart failure, unspecified: Secondary | ICD-10-CM | POA: Diagnosis not present

## 2017-03-15 LAB — CBC WITH DIFFERENTIAL/PLATELET
BASOS PCT: 0 %
Basophils Absolute: 0 10*3/uL (ref 0.0–0.1)
EOS ABS: 0.2 10*3/uL (ref 0.0–0.7)
EOS PCT: 2 %
HCT: 39.5 % (ref 36.0–46.0)
Hemoglobin: 13.5 g/dL (ref 12.0–15.0)
LYMPHS ABS: 3.5 10*3/uL (ref 0.7–4.0)
Lymphocytes Relative: 42 %
MCH: 30.6 pg (ref 26.0–34.0)
MCHC: 34.2 g/dL (ref 30.0–36.0)
MCV: 89.6 fL (ref 78.0–100.0)
MONO ABS: 0.4 10*3/uL (ref 0.1–1.0)
MONOS PCT: 5 %
Neutro Abs: 4.3 10*3/uL (ref 1.7–7.7)
Neutrophils Relative %: 51 %
PLATELETS: 222 10*3/uL (ref 150–400)
RBC: 4.41 MIL/uL (ref 3.87–5.11)
RDW: 12.8 % (ref 11.5–15.5)
WBC: 8.3 10*3/uL (ref 4.0–10.5)

## 2017-03-15 LAB — ECHOCARDIOGRAM COMPLETE
AOASC: 35 cm
CHL CUP MV DEC (S): 257
E decel time: 257 msec
FS: 38 % (ref 28–44)
Height: 67 in
IVS/LV PW RATIO, ED: 1.6
LA vol A4C: 35 ml
LADIAMINDEX: 1.57 cm/m2
LASIZE: 43 mm
LAVOL: 44.6 mL
LAVOLIN: 16.3 mL/m2
LDCA: 3.46 cm2
LEFT ATRIUM END SYS DIAM: 43 mm
LV TDI E'LATERAL: 7.94
LV TDI E'MEDIAL: 10.1
LVELAT: 7.94 cm/s
LVOTD: 21 mm
Lateral S' vel: 15.3 cm/s
MV pk E vel: 1.1 m/s
PW: 10 mm — AB (ref 0.6–1.1)
TAPSE: 28.1 mm
Weight: 5220.49 oz

## 2017-03-15 LAB — LIPID PANEL
CHOL/HDL RATIO: 7.4 ratio
Cholesterol: 193 mg/dL (ref 0–200)
HDL: 26 mg/dL — AB (ref >40)
LDL Cholesterol: UNDETERMINED mg/dL (ref 0–99)
Triglycerides: 405 mg/dL — ABNORMAL HIGH (ref <150)
VLDL: UNDETERMINED mg/dL (ref 0–40)

## 2017-03-15 LAB — BASIC METABOLIC PANEL
Anion gap: 6 (ref 5–15)
BUN: 11 mg/dL (ref 6–20)
CALCIUM: 8.6 mg/dL — AB (ref 8.9–10.3)
CHLORIDE: 108 mmol/L (ref 101–111)
CO2: 24 mmol/L (ref 22–32)
CREATININE: 0.87 mg/dL (ref 0.44–1.00)
GFR calc Af Amer: >60 mL/min (ref >60)
GFR calc non Af Amer: >60 mL/min (ref >60)
Glucose, Bld: 111 mg/dL — ABNORMAL HIGH (ref 65–99)
Potassium: 3.7 mmol/L (ref 3.5–5.1)
Sodium: 138 mmol/L (ref 135–145)

## 2017-03-15 LAB — TROPONIN I
Troponin I: <0.03 ng/mL (ref <0.03)
Troponin I: <0.03 ng/mL (ref <0.03)
Troponin I: <0.03 ng/mL (ref <0.03)

## 2017-03-15 LAB — TSH: TSH: 7.028 u[IU]/mL — ABNORMAL HIGH (ref 0.350–4.500)

## 2017-03-15 LAB — MAGNESIUM: MAGNESIUM: 2 mg/dL (ref 1.7–2.4)

## 2017-03-15 LAB — T4, FREE: FREE T4: 0.7 ng/dL (ref 0.61–1.12)

## 2017-03-15 MED ORDER — ATORVASTATIN CALCIUM 80 MG PO TABS: 80.0000 mg | ORAL_TABLET | Freq: Every day | ORAL | 0 refills | Status: DC

## 2017-03-15 MED ORDER — FLUTICASONE FUROATE-VILANTEROL 100-25 MCG/INH IN AEPB: 1.0000 | INHALATION_SPRAY | Freq: Every day | RESPIRATORY_TRACT | Status: DC

## 2017-03-15 MED ORDER — POTASSIUM CHLORIDE CRYS ER 20 MEQ PO TBCR
40.0000 meq | EXTENDED_RELEASE_TABLET | ORAL | Status: DC
  Filled 2017-03-15: qty 2

## 2017-03-15 MED ORDER — ENOXAPARIN SODIUM 80 MG/0.8ML ~~LOC~~ SOLN
75.0000 mg | SUBCUTANEOUS | Status: DC
  Filled 2017-03-15: qty 0.8

## 2017-03-15 MED ORDER — FENOFIBRATE 145 MG PO TABS: 145.0000 mg | ORAL_TABLET | Freq: Every day | ORAL | 0 refills | Status: DC

## 2017-03-15 MED ORDER — NICOTINE 21 MG/24HR TD PT24: 21.0000 mg | MEDICATED_PATCH | Freq: Every day | TRANSDERMAL | Status: DC

## 2017-03-15 MED ORDER — ONDANSETRON HCL 4 MG/2ML IJ SOLN: 4.0000 mg | Freq: Four times a day (QID) | INTRAMUSCULAR | Status: DC | PRN

## 2017-03-15 MED ORDER — FLUOXETINE HCL 40 MG PO CAPS: 40.0000 mg | ORAL_CAPSULE | Freq: Every day | ORAL | 0 refills | Status: DC

## 2017-03-15 MED ORDER — ASPIRIN EC 81 MG PO TBEC: 81.0000 mg | DELAYED_RELEASE_TABLET | Freq: Every day | ORAL | Status: DC

## 2017-03-15 MED ORDER — TICAGRELOR 90 MG PO TABS: 90.0000 mg | ORAL_TABLET | Freq: Two times a day (BID) | ORAL | 0 refills | Status: DC

## 2017-03-15 MED ORDER — TICAGRELOR 90 MG PO TABS: 90.0000 mg | ORAL_TABLET | Freq: Two times a day (BID) | ORAL | Status: DC

## 2017-03-15 MED ORDER — FLUOXETINE HCL 20 MG PO CAPS: 40.0000 mg | ORAL_CAPSULE | Freq: Every day | ORAL | Status: DC

## 2017-03-15 MED ORDER — GI COCKTAIL ~~LOC~~: 30.0000 mL | Freq: Four times a day (QID) | ORAL | Status: DC | PRN

## 2017-03-15 MED ORDER — IPRATROPIUM-ALBUTEROL 0.5-2.5 (3) MG/3ML IN SOLN: 3.0000 mL | RESPIRATORY_TRACT | Status: DC | PRN

## 2017-03-15 MED ORDER — ATORVASTATIN CALCIUM 80 MG PO TABS: 80.0000 mg | ORAL_TABLET | Freq: Every day | ORAL | Status: DC

## 2017-03-15 MED ORDER — FENOFIBRATE 160 MG PO TABS: 160.0000 mg | ORAL_TABLET | Freq: Every day | ORAL | Status: DC

## 2017-03-15 MED ORDER — NITROGLYCERIN 0.4 MG SL SUBL: 0.4000 mg | SUBLINGUAL_TABLET | SUBLINGUAL | Status: DC | PRN

## 2017-03-15 MED ORDER — ACETAMINOPHEN 325 MG PO TABS: 650.0000 mg | ORAL_TABLET | ORAL | Status: DC | PRN

## 2017-03-15 MED ORDER — MORPHINE SULFATE (PF) 4 MG/ML IV SOLN: 2.0000 mg | INTRAVENOUS | Status: DC | PRN

## 2017-03-15 MED ORDER — TRAZODONE HCL 100 MG PO TABS: 100.0000 mg | ORAL_TABLET | Freq: Every day | ORAL | Status: DC | PRN

## 2017-03-15 MED ORDER — PERFLUTREN LIPID MICROSPHERE
1.0000 mL | INTRAVENOUS | Status: AC | PRN
  Administered 2017-03-15: 5 mL via INTRAVENOUS
  Filled 2017-03-15: qty 10

## 2017-03-15 MED ORDER — SODIUM CHLORIDE 0.9 % IV SOLN: INTRAVENOUS | Status: DC

## 2017-03-15 MED ORDER — FUROSEMIDE 40 MG PO TABS: 40.0000 mg | ORAL_TABLET | Freq: Every day | ORAL | 0 refills | Status: DC | PRN

## 2017-03-15 MED ORDER — TRAZODONE HCL 100 MG PO TABS: 100.0000 mg | ORAL_TABLET | Freq: Every day | ORAL | 0 refills | Status: DC | PRN

## 2017-03-15 NOTE — H&P (Signed)
History and Physical    Kaitlyn Chambers LPF:[id number] DOB: 09-17-1980 DOA: 03/14/2017  Referring MD/NP/PA: Marney Doctor,  PCP: Lucianne Lei, MD  Patient coming from: Home via EMS  Chief Complaint: Chest Pain  HPI: Kaitlyn Chambers is a 36 y.o. female with medical history significant of NSTEMI, CAD s/p PTCA/DES to mid Circumflex in 06/2016, HTN, DM type II, bipolar disorder, COPD, and morbid obesity; who presents with complaints of left-sided substernal chest pain around 6 PM yesterday. At that time patient reports she was just standing still. Describes the pain as sharp with radiation into her neck, shoulder blades, and left arm. Associated symptoms included shortness of breath, lightheadedness, diaphoresis, nausea, vomiting, intermittent leg swelling, and reports of her heart feeling like it was skipping beats. Denies having any loss of consciousness, significant weight change,  recent travel, or dysuria. Feels like symptoms were worse than her previous NSTEMI in 06/2006 where she are placement of a drug-eluting stent to her mid circumflex. She is followed by Dr. Candee Furbish of cardiology in the outpatient setting in last office visit back in May 2018.  ED Course: Upon admission to the emergency department patient was noted to be afebrile with vital signs relatively within normal limits. Labs revealed WBC 11.4, potassium 3.2, BUN 11, creatinine 1.07, and initial troponin negative. Chest x-ray was otherwise noted to be clear. TRH called to admit for chest pain rule out.  Review of Systems  Constitutional: Positive for diaphoresis. Negative for chills, fever and malaise/fatigue.  HENT: Negative for ear pain and tinnitus.   Eyes: Negative for photophobia and pain.  Respiratory: Positive for shortness of breath.   Cardiovascular: Positive for chest pain, palpitations and leg swelling.  Gastrointestinal: Positive for nausea and vomiting. Negative for abdominal pain.  Genitourinary: Negative for  frequency and urgency.  Musculoskeletal: Negative for falls.  Neurological: Positive for dizziness. Negative for loss of consciousness.  Endo/Heme/Allergies: Negative for polydipsia.  Psychiatric/Behavioral: The patient is nervous/anxious.     Past Medical History:  Diagnosis Date  . Anxiety   . Bipolar 1 disorder, manic, full remission ([place])   . CHF (congestive heart failure) ([place])   . COPD (chronic obstructive pulmonary disease) ([place])   . Coronary artery disease   . Depression   . GERD (gastroesophageal reflux disease)   . Gout   . High cholesterol   . Hypertension   . Hypothyroidism   . Metabolic syndrome   . NSTEMI (non-ST elevated myocardial infarction) ([place]) 07/01/2016  . On home oxygen therapy    "available; don't use it" (07/01/2016)  . PCOS (polycystic ovarian syndrome)   . Stomach ulcer   . Type 2 diabetes, diet controlled ([place])     Past Surgical History:  Procedure Laterality Date  . CARDIAC CATHETERIZATION N/A 07/01/2016   Procedure: LEFT HEART CATH AND CORONARY ANGIOGRAPHY;  Surgeon: Burnell Blanks, MD;  Location: [facility];  Service: Cardiovascular;  Laterality: N/A;  . CARDIAC CATHETERIZATION N/A 07/01/2016   Procedure: Coronary Stent Intervention;  Surgeon: Burnell Blanks, MD;  Location: [facility];  Service: Cardiovascular;  Laterality: N/A;  . CARPAL TUNNEL RELEASE Bilateral   . CERVICAL BIOPSY  W/ LOOP ELECTRODE EXCISION  05/2016   "precancerous cells"  . CORONARY ANGIOPLASTY WITH STENT PLACEMENT  07/01/2016  . TONSILLECTOMY       reports that she has been smoking Cigarettes.  She has a 13.00 pack-year smoking history. She has never used smokeless tobacco. She reports that she uses drugs, including  Marijuana. She reports that she does not drink alcohol.  Allergies  Allergen Reactions  . Levaquin [Levofloxacin] Rash  . Metoprolol Nausea And Vomiting and Other (See Comments)    Sweats and dizziness    Family History    Problem Relation Age of Onset  . Diabetes Mother   . Heart failure Mother   . Heart attack Mother 75  . Heart disease Mother   . Healthy Son     Prior to Admission medications   Medication Sig Start Date End Date Taking? Authorizing Provider  aspirin EC 81 MG EC tablet Take 1 tablet (81 mg total) by mouth daily. 07/04/16  Yes Modena Jansky, MD  atorvastatin (LIPITOR) 80 MG tablet Take 1 tablet (80 mg total) by mouth daily at 6 PM. Please call and schedule September follow up [phone] 01/19/17  Yes Skains, Thana Farr, MD  BREO ELLIPTA 100-25 MCG/INH AEPB Inhale 1 puff into the lungs daily. 04/25/16  Yes [provider]  fenofibrate (TRICOR) 145 MG tablet Take 1 tablet (145 mg total) by mouth daily. 07/19/16  Yes Burtis Junes, NP  FLUoxetine (PROZAC) 40 MG capsule Take 40 mg by mouth at bedtime.  04/17/16  Yes [provider]  furosemide (LASIX) 40 MG tablet Take 1 tablet (40 mg total) by mouth daily. Patient taking differently: Take 40 mg by mouth daily as needed.  07/19/16 03/14/17 Yes Burtis Junes, NP  nitroGLYCERIN (NITROSTAT) 0.4 MG SL tablet Place 1 tablet (0.4 mg total) under the tongue every 5 (five) minutes as needed for chest pain. 02/27/17 05/28/17 Yes Jerline Pain, MD  ticagrelor (BRILINTA) 90 MG TABS tablet Take 1 tablet (90 mg total) by mouth 2 (two) times daily. 08/05/16  Yes Burtis Junes, NP  traZODone (DESYREL) 100 MG tablet Take 100 mg by mouth daily as needed for sleep.   Yes [provider]  irbesartan (AVAPRO) 75 MG tablet Take 1 tablet (75 mg total) by mouth daily. Patient not taking: Reported on 03/14/2017 07/04/16   Modena Jansky, MD    Physical Exam:  Constitutional: morbidly obese female in NAD, calm, comfortable Vitals:   03/14/17 2345 03/15/17 0000 03/15/17 0015 03/15/17 0030  BP: 140/76 133/80 131/80 126/70  Pulse: 73 73 70 71  Resp: 16 19 12 17   Temp:      TempSrc:      SpO2: 91% 93% 94% 100%   Eyes: PERRL, lids  and conjunctivae normal ENMT: Mucous membranes are moist. Posterior pharynx clear of any exudate or lesions.Normal dentition.  Neck: normal, supple, no masses, no thyromegaly Respiratory: Decreased overall aeration bilaterally, no wheezing, no crackles. Normal respiratory effort. No accessory muscle use.  Cardiovascular: Regular rate and rhythm, no murmurs / rubs / gallops. Trace extremity edema. 2+ pedal pulses. No carotid bruits.  Abdomen: no tenderness, no masses palpated. No hepatosplenomegaly. Bowel sounds positive.  Musculoskeletal: no clubbing / cyanosis. No joint deformity upper and lower extremities. Good ROM, no contractures. Normal muscle tone.  Skin: no rashes, lesions, ulcers. No induration Neurologic: CN 2-12 grossly intact. Sensation intact, DTR normal. Strength 5/5 in all 4.  Psychiatric: Normal judgment and insight. Alert and oriented x 3. Normal mood.     Labs on Admission: I have personally reviewed following labs and imaging studies  CBC:  Recent Labs Lab 03/14/17 2054  WBC 11.4*  HGB 13.5  HCT 38.7  MCV 88.4  PLT 270   Basic Metabolic Panel:  Recent Labs Lab 03/14/17 2054  NA 136  K 3.2*  CL 106  CO2 23  GLUCOSE 97  BUN 11  CREATININE 1.07*  CALCIUM 8.8*   GFR: CrCl cannot be calculated (Unknown ideal weight.). Liver Function Tests: No results for input(s): AST, ALT, ALKPHOS, BILITOT, PROT, ALBUMIN in the last 168 hours. No results for input(s): LIPASE, AMYLASE in the last 168 hours. No results for input(s): AMMONIA in the last 168 hours. Coagulation Profile: No results for input(s): INR, PROTIME in the last 168 hours. Cardiac Enzymes: No results for input(s): CKTOTAL, CKMB, CKMBINDEX, TROPONINI in the last 168 hours. BNP (last 3 results) No results for input(s): PROBNP in the last 8760 hours. HbA1C: No results for input(s): HGBA1C in the last 72 hours. CBG: No results for input(s): GLUCAP in the last 168 hours. Lipid Profile: No results  for input(s): CHOL, HDL, LDLCALC, TRIG, CHOLHDL, LDLDIRECT in the last 72 hours. Thyroid Function Tests: No results for input(s): TSH, T4TOTAL, FREET4, T3FREE, THYROIDAB in the last 72 hours. Anemia Panel: No results for input(s): VITAMINB12, FOLATE, FERRITIN, TIBC, IRON, RETICCTPCT in the last 72 hours. Urine analysis:    Component Value Date/Time   COLORURINE YELLOW 07/02/2014 1611   APPEARANCEUR CLEAR 07/02/2014 1611   LABSPEC 1.009 07/02/2014 1611   PHURINE 6.5 07/02/2014 1611   GLUCOSEU NEGATIVE 07/02/2014 1611   HGBUR NEGATIVE 07/02/2014 1611   BILIRUBINUR NEGATIVE 07/02/2014 1611   KETONESUR NEGATIVE 07/02/2014 1611   PROTEINUR NEGATIVE 07/02/2014 1611   UROBILINOGEN 0.2 07/02/2014 1611   NITRITE NEGATIVE 07/02/2014 1611   LEUKOCYTESUR NEGATIVE 07/02/2014 1611   Sepsis Labs: No results found for this or any previous visit (from the past 240 hour(s)).   Radiological Exams on Admission: Dg Chest 2 View  Result Date: 03/14/2017 CLINICAL DATA:  Chest pain and shortness of Breath EXAM: CHEST  2 VIEW COMPARISON:  10/25/2016 FINDINGS: The heart size and mediastinal contours are within normal limits. Both lungs are clear. The visualized skeletal structures are unremarkable. IMPRESSION: No active cardiopulmonary disease. Electronically Signed   By: Inez Catalina M.D.   On: 03/14/2017 21:26    EKG: Independently reviewed. Sinus rhythm with low voltage  Assessment/Plan Chest pain: Acute. Patient presents with acute onset of left-sided substernal chest pain. Heart score= 4. Initial troponin negative for any acute abnormalities and EKG showing no significant ST-T wave abnormalities. Patient is followed in the outpatient clinic by Dr. Candee Furbish of cardiology.  - Admit to telemetry bed - Chest pain order set initiated - Trend cardiac troponin  - Check uds and Lipid panel  - repeat EKG in a.m. - Check echocardiogram in a.m - Message sent to [facility] for Cards to  evaluate  Leukocytosis: Acute. WBC elevated at 11.4 on admission  Coronary artery disease without angina: Patient status post drug-eluting after NSTEMI in 06/2016.  - continue Brilinta and aspirin  Hypokalemia: Acute. Initial potassium 3.2. - Give 40 mEq of potassium chloride now - continue to monitor and replace as needed   Diabetes mellitus type 2: Patient currently not on any diabetic medications. Last hemoglobin A1c was noted to be 6.7 in 06/2016. Initial blood glucose within normal limits. - May warrant further testing  Essential hypertension: Blood pressures currently relatively maintained. Patient not currently on any blood pressure medications due to issues with low blood pressures.  COPD, without acute exacerbation: Chest x-ray otherwise clear and patient able to maintain O2 saturations on room air. - Continue Breo - DuoNeb's prn sob/wheezing  Bipolar disorder - Continue Prozac  Dyslipidemia - Continue fenofibrate  Morbid obesity - Encourage weight loss  Subclinical hypothyroidism: Patient noted to have subclinical hypothyroidism back in 2015. - Check TSH and free T4  Polysubstance abuse: Patient reports continued use of tobacco - Follow-up UDS - Counsel on need of cessation of tobacco use  DVT prophylaxis: lovenox Code Status: full  Family Communication:  Disposition Plan: Discharge home if workup otherwise negative Consults called: none Admission status:Observation  Norval Morton MD [facility] Pager [phone]   If 7PM-7AM, please contact night-coverage [email or web address] Password [facility]  03/15/2017, 12:54 AM

## 2017-03-15 NOTE — Progress Notes (Signed)
  Echocardiogram 2D Echocardiogram has been performed.  Kaitlyn Chambers 03/15/2017, 9:32 AM

## 2017-03-15 NOTE — ED Notes (Signed)
Cardiology at bedside at this time

## 2017-03-15 NOTE — Telephone Encounter (Signed)
The pt was just released from the hospital this afternoon. TCM call tomorrow on Thursday 9/13.

## 2017-03-15 NOTE — Progress Notes (Signed)
Patient admitted after midnight, please see H&P.  Echo being done.  Await cardiology recommendations.  Eulogio Bear DO

## 2017-03-15 NOTE — ED Notes (Signed)
ECHO at bedside.

## 2017-03-15 NOTE — Discharge Summary (Signed)
Physician Discharge Summary  Kaitlyn Chambers [id number]:[id number] DOB: Mar 23, 1981 DOA: 03/14/2017  PCP: Lucianne Lei, MD  Admit date: 03/14/2017 Discharge date: 03/15/2017   Recommendations for Outpatient Follow-Up:   1. outpatient cardiology follow up 2. Follow TSH   Discharge Diagnosis:   Principal Problem:   Chest pain Active Problems:   Morbid obesity ([place])   Bipolar 1 disorder ([facility])   Diabetes mellitus ([place])   Dyslipidemia   Leukocytosis   CAD (coronary artery disease)   Discharge disposition:  Home  Discharge Condition: Improved.  Diet recommendation: Low sodium, heart healthy.  Carbohydrate-modified  Wound care: None.   History of Present Illness:   Kaitlyn Chambers is a 36 y.o. female with medical history significant of NSTEMI, CAD s/p PTCA/DES to mid Circumflex in 06/2016, HTN, DM type II, bipolar disorder, COPD, and morbid obesity; who presents with complaints of left-sided substernal chest pain around 6 PM yesterday. At that time patient reports she was just standing still. Describes the pain as sharp with radiation into her neck, shoulder blades, and left arm. Associated symptoms included shortness of breath, lightheadedness, diaphoresis, nausea, vomiting, intermittent leg swelling, and reports of her heart feeling like it was skipping beats. Denies having any loss of consciousness, significant weight change,  recent travel, or dysuria. Feels like symptoms were worse than her previous NSTEMI in 06/2006 where she are placement of a drug-eluting stent to her mid circumflex. She is followed by Dr. Candee Furbish of cardiology in the outpatient setting in last office visit back in May 2018.   Hospital Course by Problem:   Chest pain -resolved -follow up with Dr. [name] -medications sent to [facility]:   cards  Discharge Exam:   Vitals:   03/15/17 1051 03/15/17 1316  BP: (!) 152/78 (!) 154/102  Pulse: 81 64  Resp:    Temp:      SpO2: 100% 100%   Vitals:   03/15/17 0527 03/15/17 0736 03/15/17 1051 03/15/17 1316  BP: 132/75 (!) 164/93 (!) 152/78 (!) 154/102  Pulse: 68 64 81 64  Resp: 17 16    Temp:      TempSrc:      SpO2: 97% 95% 100% 100%  Weight:  (!) 148 kg (326 lb 4.5 oz)    Height:  5\' 7"  (1.702 m)        The results of significant diagnostics from this hospitalization (including imaging, microbiology, ancillary and laboratory) are listed below for reference.     Procedures and Diagnostic Studies:   Dg Chest 2 View  Result Date: 03/14/2017 CLINICAL DATA:  Chest pain and shortness of Breath EXAM: CHEST  2 VIEW COMPARISON:  10/25/2016 FINDINGS: The heart size and mediastinal contours are within normal limits. Both lungs are clear. The visualized skeletal structures are unremarkable. IMPRESSION: No active cardiopulmonary disease. Electronically Signed   By: Inez Catalina M.D.   On: 03/14/2017 21:26     Labs:   Basic Metabolic Panel:  Recent Labs Lab 03/14/17 2054 03/15/17 0159 03/15/17 0700  NA 136  --  138  K 3.2*  --  3.7  CL 106  --  108  CO2 23  --  24  GLUCOSE 97  --  111*  BUN 11  --  11  CREATININE 1.07*  --  0.87  CALCIUM 8.8*  --  8.6*  MG  --  2.0  --    GFR Estimated Creatinine Clearance: 133.2 mL/min (by C-G formula based on SCr  of 0.87 mg/dL). Liver Function Tests: No results for input(s): AST, ALT, ALKPHOS, BILITOT, PROT, ALBUMIN in the last 168 hours. No results for input(s): LIPASE, AMYLASE in the last 168 hours. No results for input(s): AMMONIA in the last 168 hours. Coagulation profile No results for input(s): INR, PROTIME in the last 168 hours.  CBC:  Recent Labs Lab 03/14/17 2054 03/15/17 0700  WBC 11.4* 8.3  NEUTROABS  --  4.3  HGB 13.5 13.5  HCT 38.7 39.5  MCV 88.4 89.6  PLT 241 222   Cardiac Enzymes:  Recent Labs Lab 03/15/17 0018 03/15/17 0159 03/15/17 0458 03/15/17 0700  TROPONINI <0.03 <0.03 <0.03 <0.03   BNP: Invalid input(s):  POCBNP CBG: No results for input(s): GLUCAP in the last 168 hours. D-Dimer No results for input(s): DDIMER in the last 72 hours. Hgb A1c No results for input(s): HGBA1C in the last 72 hours. Lipid Profile  Recent Labs  03/15/17 0700  CHOL 193  HDL 26*  LDLCALC UNABLE TO CALCULATE IF TRIGLYCERIDE OVER 400 mg/dL  TRIG 405*  CHOLHDL 7.4   Thyroid function studies  Recent Labs  03/15/17 0159  TSH 7.028*   Anemia work up No results for input(s): VITAMINB12, FOLATE, FERRITIN, TIBC, IRON, RETICCTPCT in the last 72 hours. Microbiology No results found for this or any previous visit (from the past 240 hour(s)).   Discharge Instructions:   Discharge Instructions    Diet Carb Modified    Complete by:  As directed    Increase activity slowly    Complete by:  As directed      Allergies as of 03/15/2017      Reactions   Levaquin [levofloxacin] Rash   Metoprolol Nausea And Vomiting, Other (See Comments)   Sweats and dizziness      Medication List    STOP taking these medications   irbesartan 75 MG tablet Commonly known as:  AVAPRO     TAKE these medications   aspirin 81 MG EC tablet Take 1 tablet (81 mg total) by mouth daily.   atorvastatin 80 MG tablet Commonly known as:  LIPITOR Take 1 tablet (80 mg total) by mouth daily at 6 PM. Please call and schedule September follow up [phone]   BREO ELLIPTA 100-25 MCG/INH Aepb Generic drug:  fluticasone furoate-vilanterol Inhale 1 puff into the lungs daily.   fenofibrate 145 MG tablet Commonly known as:  TRICOR Take 1 tablet (145 mg total) by mouth daily.   FLUoxetine 40 MG capsule Commonly known as:  PROZAC Take 1 capsule (40 mg total) by mouth at bedtime.   furosemide 40 MG tablet Commonly known as:  LASIX Take 1 tablet (40 mg total) by mouth daily as needed.   nitroGLYCERIN 0.4 MG SL tablet Commonly known as:  NITROSTAT Place 1 tablet (0.4 mg total) under the tongue every 5 (five) minutes as needed for  chest pain.   ticagrelor 90 MG Tabs tablet Commonly known as:  BRILINTA Take 1 tablet (90 mg total) by mouth 2 (two) times daily.   traZODone 100 MG tablet Commonly known as:  DESYREL Take 1 tablet (100 mg total) by mouth daily as needed for sleep.            Discharge Care Instructions        Start     Ordered   03/15/17 0000  atorvastatin (LIPITOR) 80 MG tablet  Daily-1800     03/15/17 1413   03/15/17 0000  FLUoxetine (PROZAC) 40 MG capsule  Daily  at bedtime     03/15/17 1413   03/15/17 0000  fenofibrate (TRICOR) 145 MG tablet  Daily     03/15/17 1413   03/15/17 0000  furosemide (LASIX) 40 MG tablet  Daily PRN     03/15/17 1413   03/15/17 0000  ticagrelor (BRILINTA) 90 MG TABS tablet  2 times daily     03/15/17 1413   03/15/17 0000  traZODone (DESYREL) 100 MG tablet  Daily PRN     03/15/17 1413   03/15/17 0000  Increase activity slowly     03/15/17 1413   03/15/17 0000  Diet Carb Modified     03/15/17 1413     Follow-up Information    Jerline Pain, MD. Call on 03/28/2017.   Specialty:  Cardiology Why:  at 11:15 am  Contact information: [address] [place] [address] [phone]        Lucianne Lei, MD Follow up in 1 week(s).   Specialty:  Family Medicine Contact information: [address] [place] [address] [phone]            Time coordinating discharge: 35 min  Signed:  Jaleena Viviani U Gracilyn Gunia   [facility] 03/15/2017, 2:16 PM

## 2017-03-15 NOTE — ED Notes (Addendum)
Patient reported that she would have no difficulty picking up her prescriptions at [facility] since she has Medicare & Medicaid.

## 2017-03-15 NOTE — ED Notes (Signed)
Lunch provided to patient

## 2017-03-15 NOTE — ED Notes (Signed)
Delay in lab draw,  Admitting MD at bedside. 

## 2017-03-15 NOTE — Telephone Encounter (Signed)
TOC Pt-Please call pt-Pt has an appt with Truitt Merle on 03-28-17

## 2017-03-15 NOTE — ED Provider Notes (Signed)
[facility] DEPT Provider Note   CSN: [id number] Arrival date & time: 03/14/17  2032     History   Chief Complaint Chief Complaint  Patient presents with  . Chest Pain    HPI Kaitlyn Chambers is a 36 y.o. female.  HPI  36 year old female with chest pain. Symptom onset 2 hours ago while at rest. Patient describes pain in the center of her chest with radiation between her shoulder blades and into her neck and left arm. Symptoms improved since onset but not completely relief. She has a past history CAD with previous MI and stenting. She states that current symptoms feel similar as to then. She had 324 mg of aspirin prior to arrival. Currently she is anxious and crying.   Past Medical History:  Diagnosis Date  . Anxiety   . Bipolar 1 disorder, manic, full remission ([place])   . CHF (congestive heart failure) ([place])   . COPD (chronic obstructive pulmonary disease) ([place])   . Coronary artery disease   . Depression   . GERD (gastroesophageal reflux disease)   . Gout   . High cholesterol   . Hypertension   . Hypothyroidism   . Metabolic syndrome   . NSTEMI (non-ST elevated myocardial infarction) ([place]) 07/01/2016  . On home oxygen therapy    "available; don't use it" (07/01/2016)  . PCOS (polycystic ovarian syndrome)   . Stomach ulcer   . Type 2 diabetes, diet controlled [facility])     Patient Active Problem List   Diagnosis Date Noted  . Diabetes mellitus ([place])   . Chest pain 07/01/2016  . Morbid obesity ([place]) 07/01/2016  . PCOS (polycystic ovarian syndrome) 07/01/2016  . Hypertension 07/01/2016  . Bipolar 1 disorder ([place])   . NSTEMI (non-ST elevated myocardial infarction) [facility])     Past Surgical History:  Procedure Laterality Date  . CARDIAC CATHETERIZATION N/A 07/01/2016   Procedure: LEFT HEART CATH AND CORONARY ANGIOGRAPHY;  Surgeon: Burnell Blanks, MD;  Location: [facility] CV LAB;  Service: Cardiovascular;  Laterality: N/A;  . CARDIAC CATHETERIZATION N/A  07/01/2016   Procedure: Coronary Stent Intervention;  Surgeon: Burnell Blanks, MD;  Location: [facility] CV LAB;  Service: Cardiovascular;  Laterality: N/A;  . CARPAL TUNNEL RELEASE Bilateral   . CERVICAL BIOPSY  W/ LOOP ELECTRODE EXCISION  05/2016   "precancerous cells"  . CORONARY ANGIOPLASTY WITH STENT PLACEMENT  07/01/2016  . TONSILLECTOMY      OB History    Gravida Para Term Preterm AB Living   1   0 0 1 0   SAB TAB Ectopic Multiple Live Births   1 0 0 0         Home Medications    Prior to Admission medications   Medication Sig Start Date End Date Taking? Authorizing Provider  aspirin EC 81 MG EC tablet Take 1 tablet (81 mg total) by mouth daily. 07/04/16  Yes Modena Jansky, MD  atorvastatin (LIPITOR) 80 MG tablet Take 1 tablet (80 mg total) by mouth daily at 6 PM. Please call and schedule September follow up [phone] 01/19/17  Yes Skains, Thana Farr, MD  BREO ELLIPTA 100-25 MCG/INH AEPB Inhale 1 puff into the lungs daily. 04/25/16  Yes [provider]  fenofibrate (TRICOR) 145 MG tablet Take 1 tablet (145 mg total) by mouth daily. 07/19/16  Yes Burtis Junes, NP  FLUoxetine (PROZAC) 40 MG capsule Take 40 mg by mouth at bedtime.  04/17/16  Yes [provider]  furosemide (LASIX)  40 MG tablet Take 1 tablet (40 mg total) by mouth daily. Patient taking differently: Take 40 mg by mouth daily as needed.  07/19/16 03/14/17 Yes Burtis Junes, NP  nitroGLYCERIN (NITROSTAT) 0.4 MG SL tablet Place 1 tablet (0.4 mg total) under the tongue every 5 (five) minutes as needed for chest pain. 02/27/17 05/28/17 Yes Jerline Pain, MD  ticagrelor (BRILINTA) 90 MG TABS tablet Take 1 tablet (90 mg total) by mouth 2 (two) times daily. 08/05/16  Yes Burtis Junes, NP  traZODone (DESYREL) 100 MG tablet Take 100 mg by mouth daily as needed for sleep.   Yes [provider]  irbesartan (AVAPRO) 75 MG tablet Take 1 tablet (75 mg total) by mouth daily. Patient not  taking: Reported on 03/14/2017 07/04/16   Modena Jansky, MD    Family History Family History  Problem Relation Age of Onset  . Diabetes Mother   . Heart failure Mother   . Heart attack Mother 33  . Heart disease Mother   . Healthy Son     Social History Social History  Substance Use Topics  . Smoking status: Current Every Day Smoker    Packs/day: 0.50    Years: 26.00    Types: Cigarettes  . Smokeless tobacco: Never Used  . Alcohol use No     Allergies   Levaquin [levofloxacin] and Metoprolol   Review of Systems Review of Systems  All systems reviewed and negative, other than as noted in HPI.   Physical Exam Updated Vital Signs BP 140/76   Pulse 73   Temp 98.2 F (36.8 C) (Oral)   Resp 16   LMP 02/26/2017   SpO2 91%   Physical Exam  Constitutional: She appears well-developed and well-nourished. No distress.  Laying in bed. Obese. Crying.   HENT:  Head: Normocephalic and atraumatic.  Eyes: Conjunctivae are normal. Right eye exhibits no discharge. Left eye exhibits no discharge.  Neck: Neck supple.  Cardiovascular: Normal rate, regular rhythm and normal heart sounds.  Exam reveals no gallop and no friction rub.   No murmur heard. Pulmonary/Chest: Effort normal and breath sounds normal. No respiratory distress. She exhibits no tenderness.  Abdominal: Soft. She exhibits no distension. There is no tenderness.  Musculoskeletal: She exhibits no edema or tenderness.  Lower extremities symmetric as compared to each other. No calf tenderness. Negative Homan's. No palpable cords.   Neurological: She is alert.  Skin: Skin is warm and dry.  Psychiatric:  anxious  Nursing note and vitals reviewed.    ED Treatments / Results  Labs (all labs ordered are listed, but only abnormal results are displayed) Labs Reviewed  BASIC METABOLIC PANEL - Abnormal; Notable for the following:       Result Value   Potassium 3.2 (*)    Creatinine, Ser 1.07 (*)    Calcium 8.8  (*)    All other components within normal limits  CBC - Abnormal; Notable for the following:    WBC 11.4 (*)    All other components within normal limits  LIPID PANEL - Abnormal; Notable for the following:    Triglycerides 405 (*)    HDL 26 (*)    All other components within normal limits  BASIC METABOLIC PANEL - Abnormal; Notable for the following:    Glucose, Bld 111 (*)    Calcium 8.6 (*)    All other components within normal limits  TSH - Abnormal; Notable for the following:    TSH 7.028 (*)  All other components within normal limits  TROPONIN I  TROPONIN I  TROPONIN I  TROPONIN I  CBC WITH DIFFERENTIAL/PLATELET  MAGNESIUM  T4, FREE  I-STAT TROPONIN, ED    EKG  EKG Interpretation  Date/Time:  Tuesday March 14 2017 20:39:19 EDT Ventricular Rate:  64 PR Interval:    QRS Duration: 95 QT Interval:  420 QTC Calculation: 434 R Axis:   75 Text Interpretation:  Sinus rhythm Low voltage, precordial leads Probable anteroseptal infarct, old Confirmed by Virgel Manifold [id number]) on 03/15/2017 12:15:30 AM       Radiology Dg Chest 2 View  Result Date: 03/14/2017 CLINICAL DATA:  Chest pain and shortness of Breath EXAM: CHEST  2 VIEW COMPARISON:  10/25/2016 FINDINGS: The heart size and mediastinal contours are within normal limits. Both lungs are clear. The visualized skeletal structures are unremarkable. IMPRESSION: No active cardiopulmonary disease. Electronically Signed   By: Inez Catalina M.D.   On: 03/14/2017 21:26    Procedures Procedures (including critical care time)  Medications Ordered in ED Medications  LORazepam (ATIVAN) injection 1 mg (1 mg Intravenous Given 03/14/17 2205)  ketorolac (TORADOL) 15 MG/ML injection 15 mg (15 mg Intravenous Given 03/14/17 2202)  ondansetron (ZOFRAN) injection 4 mg (4 mg Intravenous Given 03/14/17 2200)     Initial Impression / Assessment and Plan / ED Course  I have reviewed the triage vital signs and the nursing  notes.  Pertinent labs & imaging results that were available during my care of the patient were reviewed by me and considered in my medical decision making (see chart for details).  36 year old female with chest pain. Several concerning features but may also be anxiety component to symptoms. Known CAD. ED workup is fairly unremarkable but symptoms began shortly before arrival. 324mg  aspirin prior to arrival. She has now chest pain free. Will admit for rule out.  Final Clinical Impressions(s) / ED Diagnoses   Final diagnoses:  Chest pain, unspecified type    New Prescriptions New Prescriptions   No medications on file     Virgel Manifold, MD 03/31/17 1304

## 2017-03-15 NOTE — Consult Note (Signed)
Cardiology Consultation:   Patient ID: Kaitlyn Chambers; [id number]; 1981-01-17   Admit date: 03/14/2017 Date of Consult: 03/15/2017  Primary Care Provider: Lucianne Lei, MD Primary Cardiologist: Dr. [name] Primary Electrophysiologist:  NA   Patient Profile:   Kaitlyn Chambers is a 36 y.o. female with a hx of NSTEMI and CAD with occluded RCA with collateral flow, and stent to LCX DES 06/2016, morbid obesity, DM, HTN  who is being seen today for the evaluation of chest pain at the request of Dr. Eliseo Squires.  History of Present Illness:   Kaitlyn Chambers  hx of NSTEMI and CAD with occluded RCA with collateral flow, and stent to LCX DES 06/2016, morbid obesity, DM-2, HTN, bipolar disorder, COPD  now presents with chest pain that began yesterday at 6 PM.  She was standing still and developed Lt sided chest pain.  Sharp pain with rediation into neck, shoulder blades and Lt arm,  She was SOB, diaphoretic, nausea with vomiting  X 1.  She tried NTG with some relief but not much.  She had been out buying groceries before storm.  It did feel like previous MI.   2 months ago she had similar symptoms but it was a panic attack.  This time it lasted longer so EMS called and they were concerned about her .  Pain went away once she fell asleep and currently mild chest discomfort.  She is out of her anti anxiety meds and cannot see that MD until Oct for refill.  She has decreased tobacco to < half a pk per day.  occ marijuana for anxiety.  But less than previous.   She was out of Brilinta for 2 weeks, she did continue with ASA. She continues with lipitor but not avapro, no synthroid.  No Glucophage.  She does have PVCs while she has experienced before currently freq.  And symptomatic just being aware, increasing her anxiety.     + stress at home with 3 children 29 yr old with behavioral issues, one with touretts and the third also has similar issues.    troponin 0.01-poc all other troponins <0.03 X 4  EKG:   The EKG was personally reviewed and demonstrates:  SR with no acute changes Telemetry:  Telemetry was personally reviewed and demonstrates:  SR with occ coupled PVCs   TG 405, T chol 193 unable to determine LDL CBC normal BMP normal with K+ 3.7 and Cr 0.87 TSH 7.028, free T4 0.70   CXR no active cardiopulmonary issues Echo pending.   Past Medical History:  Diagnosis Date  . Anxiety   . Bipolar 1 disorder, manic, full remission ([place])   . CHF (congestive heart failure) ([place])   . COPD (chronic obstructive pulmonary disease) ([place])   . Coronary artery disease   . Depression   . GERD (gastroesophageal reflux disease)   . Gout   . High cholesterol   . Hypertension   . Hypothyroidism   . Metabolic syndrome   . NSTEMI (non-ST elevated myocardial infarction) ([place]) 07/01/2016  . On home oxygen therapy    "available; don't use it" (07/01/2016)  . PCOS (polycystic ovarian syndrome)   . Stomach ulcer   . Type 2 diabetes, diet controlled ([place])     Past Surgical History:  Procedure Laterality Date  . CARDIAC CATHETERIZATION N/A 07/01/2016   Procedure: LEFT HEART CATH AND CORONARY ANGIOGRAPHY;  Surgeon: Burnell Blanks, MD;  Location: [facility] LAB;  Service: Cardiovascular;  Laterality: N/A;  . CARDIAC CATHETERIZATION  N/A 07/01/2016   Procedure: Coronary Stent Intervention;  Surgeon: Burnell Blanks, MD;  Location: [facility] CV LAB;  Service: Cardiovascular;  Laterality: N/A;  . CARPAL TUNNEL RELEASE Bilateral   . CERVICAL BIOPSY  W/ LOOP ELECTRODE EXCISION  05/2016   "precancerous cells"  . CORONARY ANGIOPLASTY WITH STENT PLACEMENT  07/01/2016  . TONSILLECTOMY       Home Medications:  Prior to Admission medications   Medication Sig Start Date End Date Taking? Authorizing Provider  aspirin EC 81 MG EC tablet Take 1 tablet (81 mg total) by mouth daily. 07/04/16  Yes Modena Jansky, MD  atorvastatin (LIPITOR) 80 MG tablet Take 1 tablet (80 mg total) by mouth  daily at 6 PM. Please call and schedule September follow up [phone] 01/19/17  Yes Skains, Thana Farr, MD  BREO ELLIPTA 100-25 MCG/INH AEPB Inhale 1 puff into the lungs daily. 04/25/16  Yes [provider]  fenofibrate (TRICOR) 145 MG tablet Take 1 tablet (145 mg total) by mouth daily. 07/19/16  Yes Burtis Junes, NP  FLUoxetine (PROZAC) 40 MG capsule Take 40 mg by mouth at bedtime.  04/17/16  Yes [provider]  furosemide (LASIX) 40 MG tablet Take 1 tablet (40 mg total) by mouth daily. Patient taking differently: Take 40 mg by mouth daily as needed.  07/19/16 03/14/17 Yes Burtis Junes, NP  nitroGLYCERIN (NITROSTAT) 0.4 MG SL tablet Place 1 tablet (0.4 mg total) under the tongue every 5 (five) minutes as needed for chest pain. 02/27/17 05/28/17 Yes Jerline Pain, MD  ticagrelor (BRILINTA) 90 MG TABS tablet Take 1 tablet (90 mg total) by mouth 2 (two) times daily. 08/05/16  Yes Burtis Junes, NP  traZODone (DESYREL) 100 MG tablet Take 100 mg by mouth daily as needed for sleep.   Yes [provider]  irbesartan (AVAPRO) 75 MG tablet Take 1 tablet (75 mg total) by mouth daily. Patient not taking: Reported on 03/14/2017 07/04/16   Modena Jansky, MD   Not taking avapro, lasix tricor and out of Brilinta for 2 weeks.   Inpatient Medications: Scheduled Meds: . [START ON 03/16/2017] aspirin EC  81 mg Oral Daily  . atorvastatin  80 mg Oral q1800  . enoxaparin (LOVENOX) injection  75 mg Subcutaneous Q24H  . fenofibrate  160 mg Oral Daily  . FLUoxetine  40 mg Oral QHS  . fluticasone furoate-vilanterol  1 puff Inhalation Daily  . nicotine  21 mg Transdermal Daily  . potassium chloride  40 mEq Oral STAT  . ticagrelor  90 mg Oral BID   Continuous Infusions: . sodium chloride     PRN Meds: acetaminophen, gi cocktail, ipratropium-albuterol, morphine injection, nitroGLYCERIN, ondansetron (ZOFRAN) IV, traZODone  Allergies:    Allergies  Allergen Reactions  . Levaquin  [Levofloxacin] Rash  . Metoprolol Nausea And Vomiting and Other (See Comments)    Sweats and dizziness    Social History:   Social History   Social History  . Marital status: Divorced    Spouse name: N/A  . Number of children: N/A  . Years of education: N/A   Occupational History  . Not on file.   Social History Main Topics  . Smoking status: Current Every Day Smoker    Packs/day: 0.50    Years: 26.00    Types: Cigarettes  . Smokeless tobacco: Never Used  . Alcohol use No  . Drug use: Yes    Types: Marijuana     Comment: 06/28/2016 "couple  times/week; if that"  . Sexual activity: Yes    Birth control/ protection: None   Other Topics Concern  . Not on file   Social History Narrative  . No narrative on file    Family History:    Family History  Problem Relation Age of Onset  . Diabetes Mother   . Heart failure Mother   . Heart attack Mother 28  . Heart disease Mother   . Healthy Son      ROS:  Please see the history of present illness.  ROS  General:no colds or fevers, no weight changes Skin:no rashes or ulcers HEENT:no blurred vision, no congestion CV:see HPI--on home oxygen  PUL:see HPI GI:no diarrhea constipation or melena, no indigestion GU:no hematuria, no dysuria MS:no joint pain, no claudication Neuro:no syncope, no lightheadedness Endo:no diabetes but metabolic syndrome, + thyroid disease, polycystic ovarian syndrome  Psych:  Out of anti anxiety meds.     GYN:  Last period 02/25/17 does not believe she is pregnant.    Physical Exam/Data:   Vitals:   03/15/17 0145 03/15/17 0527 03/15/17 0736 03/15/17 1051  BP: 123/69 132/75 (!) 164/93 (!) 152/78  Pulse: 67 68 64 81  Resp: (!) 21 17 16    Temp:      TempSrc:      SpO2: 94% 97% 95% 100%  Weight:   (!) 326 lb 4.5 oz (148 kg)   Height:   5\' 7"  (1.702 m)    No intake or output data in the 24 hours ending 03/15/17 1235 Filed Weights   03/15/17 0736  Weight: (!) 326 lb 4.5 oz (148 kg)    Body mass index is 51.1 kg/m.  General:  Well nourished, well developed, in no acute distress, obese HEENT: normal Lymph: no adenopathy Neck: no JVD Endocrine:  No thryomegaly Vascular: No carotid bruits; 2+ pedal pulses bil Cardiac:  normal S1, S2; RRR; no murmur, gallup rub or click  Lungs:  clear to auscultation bilaterally, no wheezing, rhonchi or rales  Abd: soft, nontender, no hepatomegaly  Ext: no edema Musculoskeletal:  No deformities, BUE and BLE strength normal and equal Skin: warm and dry + tattoos  Neuro:  Alert and oriented X 3 MAE follows commands + facial symmetry.  Psych:  Normal affect    Relevant CV Studies: Procedures   Coronary Stent Intervention  LEFT HEART CATH AND CORONARY ANGIOGRAPHY  Conclusion     The left ventricular ejection fraction is 50-55% by visual estimate.  The left ventricular systolic function is normal.  LV end diastolic pressure is normal.  There is no mitral valve regurgitation.  Mid RCA lesion, 100 %stenosed.  A STENT SYNERGY DES 3X20 drug eluting stent was successfully placed.  Prox Cx to Mid Cx lesion, 100 %stenosed.  Post intervention, there is a 0% residual stenosis.   1. Acute lateral MI secondary to occluded Circumflex (NSTEMI) 2. Chronic occlusion proximal RCA with filling of distal vessel from left to right collaterals.  3. Preserved LV systolic function 4. Successful PTCA/DES x 1 mid Circumflex.  Recommendations: DAPT for one year with ASA/Brilinta. Continue statin. Beta blocker as tolerated.       Laboratory Data:  Chemistry Recent Labs Lab 03/14/17 2054 03/15/17 0700  NA 136 138  K 3.2* 3.7  CL 106 108  CO2 23 24  GLUCOSE 97 111*  BUN 11 11  CREATININE 1.07* 0.87  CALCIUM 8.8* 8.6*  GFRNONAA >60 >60  GFRAA >60 >60  ANIONGAP 7 6  No results for input(s): PROT, ALBUMIN, AST, ALT, ALKPHOS, BILITOT in the last 168 hours. Hematology Recent Labs Lab 03/14/17 2054 03/15/17 0700  WBC  11.4* 8.3  RBC 4.38 4.41  HGB 13.5 13.5  HCT 38.7 39.5  MCV 88.4 89.6  MCH 30.8 30.6  MCHC 34.9 34.2  RDW 12.8 12.8  PLT 241 222   Cardiac Enzymes Recent Labs Lab 03/15/17 0018 03/15/17 0159 03/15/17 0458 03/15/17 0700  TROPONINI <0.03 <0.03 <0.03 <0.03    Recent Labs Lab 03/14/17 2100  TROPIPOC 0.01    BNPNo results for input(s): BNP, PROBNP in the last 168 hours.  DDimer No results for input(s): DDIMER in the last 168 hours.  Radiology/Studies:  Dg Chest 2 View  Result Date: 03/14/2017 CLINICAL DATA:  Chest pain and shortness of Breath EXAM: CHEST  2 VIEW COMPARISON:  10/25/2016 FINDINGS: The heart size and mediastinal contours are within normal limits. Both lungs are clear. The visualized skeletal structures are unremarkable. IMPRESSION: No active cardiopulmonary disease. Electronically Signed   By: Inez Catalina M.D.   On: 03/14/2017 21:26    Assessment and Plan:   1. Chest pain with neg MI, mild chest discomfort remains- but overall feels better.  She was unsure if this was anxiety attack vs. MI. Dr. Angelena Form to see.  2.          CAD with totally occluded RCA with collaterals and previous stent to LCX in 06/2016 with NSTEMI, DES.  Has not been on BB due to COPD --ok to discharge , no ACS, will do close follow up --preg test if recurrent pain then may need cath  3.           HTN controlled  4.            HLD on statin  5.            DM-2 per IM  6.            Bipolar disease not on meds has appt in Oct  7.             COPD with home oxygen     For questions or updates, please contact [facility] Please consult [email or web address] for contact info under Cardiology/STEMI.   Signed, Cecilie Kicks, NP  03/15/2017 12:35 PM   I have personally seen and examined this patient with Cecilie Kicks, NP. I agree with the assessment and plan as outlined above. Ms. Kandice Chambers is presenting with one episode of chest pain which has now resolved. She had a cardiac cath in December  2017 and had a DES placed in the Circumflex. She has a CTO of the RCA. She had a stressful situation that brought on her pain. Her troponin is negative. Her EKG shows no changes.  Exam:  General: Well developed, well nourished, NAD  HEENT: OP clear, mucus membranes moist  SKIN: warm, dry. No rashes. Neuro: No focal deficits  Musculoskeletal: Muscle strength 5/5 all ext  Psychiatric: Mood and affect normal  Neck: No JVD, no carotid bruits, no thyromegaly, no lymphadenopathy.  Lungs:Clear bilaterally, no wheezes, rhonci, crackles Cardiovascular: Regular rate and rhythm. No murmurs, gallops or rubs. Abdomen:Soft. Bowel sounds present. Non-tender.  Extremities: No lower extremity edema. Pulses are 2 + in the bilateral DP/PT. Plan: She has resolution of her chest pain and no evidence of ACS. She wishes to go home She has been out of all meds. If she is given her medications, she can go home. We will  arrange f/u with Dr. [name] in the next several weeks.   Lauree Chandler 03/15/2017 2:05 PM

## 2017-03-15 NOTE — ED Notes (Signed)
Patient ambulated to restroom unassisted.  Gait steady.

## 2017-03-16 NOTE — Telephone Encounter (Signed)
TCM call--first call  Left message on answering machine to call me back to review appointment, medications, etc.

## 2017-03-17 ENCOUNTER — Other Ambulatory Visit: Payer: Self-pay | Admitting: Nurse Practitioner

## 2017-03-17 NOTE — Telephone Encounter (Signed)
**Note De-Identified  Obfuscation** Patient contacted regarding discharge from [facility] on 03/15/17.  Patient understands to follow up with provider Truitt Merle, NP on 9/25 at 11:30 (pt aware to arrive at 11:15) at [facility] in [place]. Patient understands discharge instructions? Yes Patient understands medications and regiment? Yes Patient understands to bring all medications to this visit? Yes  The pt states that she is doing ok and has no complaints at this time. She does have [facility]'s phone number to call if she has any questions or concerns.

## 2017-03-21 ENCOUNTER — Other Ambulatory Visit: Payer: Self-pay | Admitting: Cardiology

## 2017-03-21 MED ORDER — ATORVASTATIN CALCIUM 80 MG PO TABS: 80.0000 mg | ORAL_TABLET | Freq: Every day | ORAL | 2 refills | Status: DC

## 2017-03-28 ENCOUNTER — Ambulatory Visit (INDEPENDENT_AMBULATORY_CARE_PROVIDER_SITE_OTHER): Payer: Medicare HMO | Admitting: Nurse Practitioner

## 2017-03-28 ENCOUNTER — Encounter: Payer: Self-pay | Admitting: Nurse Practitioner

## 2017-03-28 VITALS — BP 140/80 | HR 69 | Ht 67.0 in | Wt 321.1 lb

## 2017-03-28 DIAGNOSIS — I252 Old myocardial infarction: Secondary | ICD-10-CM

## 2017-03-28 DIAGNOSIS — I2510 Atherosclerotic heart disease of native coronary artery without angina pectoris: Secondary | ICD-10-CM | POA: Diagnosis not present

## 2017-03-28 DIAGNOSIS — R079 Chest pain, unspecified: Secondary | ICD-10-CM | POA: Diagnosis not present

## 2017-03-28 MED ORDER — IRBESARTAN 75 MG PO TABS: 75.0000 mg | ORAL_TABLET | Freq: Every day | ORAL | 6 refills | Status: DC

## 2017-03-28 MED ORDER — BREO ELLIPTA 100-25 MCG/INH IN AEPB: 1.0000 | INHALATION_SPRAY | Freq: Every day | RESPIRATORY_TRACT | 0 refills | Status: DC

## 2017-03-28 NOTE — Patient Instructions (Addendum)
We will be checking the following labs today - NONE   Medication Instructions:    Continue with your current medicines.  Get back on your Avapro     Testing/Procedures To Be Arranged:  N/A  Follow-Up:   See me in about 3 months    Other Special Instructions:   Arrange primary care - ask about rechecking your thyroid level    If you need a refill on your cardiac medications before your next appointment, please call your pharmacy.   Call the [place] office at [phone] if you have any questions, problems or concerns.

## 2017-03-28 NOTE — Progress Notes (Addendum)
CARDIOLOGY OFFICE NOTE  Date:  03/28/2017    Gerald Stabs Date of Birth: October 08, 1980 Medical Record #[id number]  PCP:  Lucianne Lei, MD  Cardiologist:  [facility]    Chief Complaint  Patient presents with  . Coronary Artery Disease    Post hospital visit - seen for Dr. [name]    History of Present Illness: Kaitlyn Chambers is a 36 y.o. female who presents today for a post ER visit. Seen for Dr. [name].   She has a PMH of morbid obesity, PCOS/metabolic syndrome on metformin (denied history of DM prior to admission), HTN, bipolar disorder, COPD, tobacco &THC abuse.   She presented to [facility] ED on 07/01/16 with substernal chest pain.   EMS was subsequently called and was given sublingual NTG which relieved her pain and she did not want to be transported to the hospital. She then had worsening chest pain that awakened her from sleep and she called 911 again. Family history of MI in mother at age 64 who had stenting. Cardiology assessed her with NSTEMIand she underwent cardiac cath and had DESto circumflex on 07/01/16.  I last saw her back in January - some anxiety. Seemed to be on track and motivated to make changes. Saw Mickel Baas in February - having chest pain. Last seen by Dr. [name] back in May - felt to be doing ok - some atypical chest pain but stable from our standpoint.   In the ER earlier this month - having chest pain in setting of significant stress - got admitted - seen by Dr. Angelena Form - EKG negative. Troponin negative. She had been out of her medicines - these were restarted and she was sent home to follow back up here.   Comes in today. Here alone. She says she is doing better. No further chest pain. Still with lots of stress in her life - her 37 year old is being taken out of her home due to have 3 felony charges placed against him. She has some palpitations - also felt to be anxiety driven. Needs her Avapro restarted for her BP. She is trying to lose weight. Seems  motivated. Seeing her med manager for her psyche issues on Friday. She has moved to [place] - she is trying to arrange primary care.   Past Medical History:  Diagnosis Date  . Anxiety   . Bipolar 1 disorder, manic, full remission ([place])   . CHF (congestive heart failure) ([place])   . COPD (chronic obstructive pulmonary disease) ([place])   . Coronary artery disease   . Depression   . GERD (gastroesophageal reflux disease)   . Gout   . High cholesterol   . Hypertension   . Hypothyroidism   . Metabolic syndrome   . NSTEMI (non-ST elevated myocardial infarction) ([place]) 07/01/2016  . On home oxygen therapy    "available; don't use it" (07/01/2016)  . PCOS (polycystic ovarian syndrome)   . Stomach ulcer   . Type 2 diabetes, diet controlled ([place])     Past Surgical History:  Procedure Laterality Date  . CARDIAC CATHETERIZATION N/A 07/01/2016   Procedure: LEFT HEART CATH AND CORONARY ANGIOGRAPHY;  Surgeon: Burnell Blanks, MD;  Location: [place] CV LAB;  Service: Cardiovascular;  Laterality: N/A;  . CARDIAC CATHETERIZATION N/A 07/01/2016   Procedure: Coronary Stent Intervention;  Surgeon: Burnell Blanks, MD;  Location: [place] CV LAB;  Service: Cardiovascular;  Laterality: N/A;  . CARPAL TUNNEL RELEASE Bilateral   . CERVICAL BIOPSY  W/  LOOP ELECTRODE EXCISION  05/2016   "precancerous cells"  . CORONARY ANGIOPLASTY WITH STENT PLACEMENT  07/01/2016  . TONSILLECTOMY       Medications: Current Meds  Medication Sig  . aspirin EC 81 MG EC tablet Take 1 tablet (81 mg total) by mouth daily.  Marland Kitchen atorvastatin (LIPITOR) 80 MG tablet Take 1 tablet (80 mg total) by mouth daily at 6 PM.  . fenofibrate (TRICOR) 145 MG tablet Take 1 tablet (145 mg total) by mouth daily.  Marland Kitchen FLUoxetine (PROZAC) 40 MG capsule Take 1 capsule (40 mg total) by mouth at bedtime.  . furosemide (LASIX) 40 MG tablet Take 1 tablet (40 mg total) by mouth daily as needed.  . nitroGLYCERIN (NITROSTAT) 0.4 MG  SL tablet Place 1 tablet (0.4 mg total) under the tongue every 5 (five) minutes as needed for chest pain.  . ticagrelor (BRILINTA) 90 MG TABS tablet Take 1 tablet (90 mg total) by mouth 2 (two) times daily.  . traZODone (DESYREL) 100 MG tablet Take 1 tablet (100 mg total) by mouth daily as needed for sleep.     Allergies: Allergies  Allergen Reactions  . Levaquin [Levofloxacin] Rash  . Metoprolol Nausea And Vomiting and Other (See Comments)    Sweats and dizziness    Social History: The patient  reports that she has been smoking Cigarettes.  She has a 13.00 pack-year smoking history. She has never used smokeless tobacco. She reports that she uses drugs, including Marijuana. She reports that she does not drink alcohol.   Family History: The patient's family history includes Diabetes in her mother; Healthy in her son; Heart attack (age of onset: 42) in her mother; Heart disease in her mother; Heart failure in her mother.   Review of Systems: Please see the history of present illness.   Otherwise, the review of systems is positive for none.   All other systems are reviewed and negative.   Physical Exam: VS:  BP 140/80 (BP Location: Left Arm, Patient Position: Sitting, Cuff Size: Large)   Pulse 69   Ht 5\' 7"  (1.702 m)   Wt (!) 321 lb 1.9 oz (145.7 kg)   LMP 02/26/2017   SpO2 98% Comment: at rest  BMI 50.29 kg/m  .  BMI Body mass index is 50.29 kg/m.  Wt Readings from Last 3 Encounters:  03/28/17 (!) 321 lb 1.9 oz (145.7 kg)  03/15/17 (!) 326 lb 4.5 oz (148 kg)  11/02/16 (!) 326 lb (147.9 kg)    General: Pleasant. Morbidly obese. She is alert and in no acute distress.   HEENT: Normal.  Neck: Supple, no JVD, carotid bruits, or masses noted.  Cardiac: Regular rate and rhythm. Heart tones are distant.  No edema.  Respiratory:  Lungs are fairly clear to auscultation bilaterally with normal work of breathing.  GI: Soft and nontender. Obese.  MS: No deformity or atrophy. Gait and  ROM intact.  Skin: Warm and dry. Color is normal.  Neuro:  Strength and sensation are intact and no gross focal deficits noted.  Psych: Alert, appropriate and with normal affect.   LABORATORY DATA:  EKG:  EKG is not ordered today.  Lab Results  Component Value Date   WBC 8.3 03/15/2017   HGB 13.5 03/15/2017   HCT 39.5 03/15/2017   PLT 222 03/15/2017   GLUCOSE 111 (H) 03/15/2017   CHOL 193 03/15/2017   TRIG 405 (H) 03/15/2017   HDL 26 (L) 03/15/2017   LDLCALC UNABLE TO CALCULATE IF TRIGLYCERIDE  OVER 400 mg/dL 03/15/2017   ALT 18 12/07/2011   AST 19 12/07/2011   NA 138 03/15/2017   K 3.7 03/15/2017   CL 108 03/15/2017   CREATININE 0.87 03/15/2017   BUN 11 03/15/2017   CO2 24 03/15/2017   TSH 7.028 (H) 03/15/2017   INR 1.01 07/01/2016   HGBA1C 6.7 (H) 07/01/2016     BNP (last 3 results) No results for input(s): BNP in the last 8760 hours.  ProBNP (last 3 results) No results for input(s): PROBNP in the last 8760 hours.   Other Studies Reviewed Today:  Cardiac catheterization: 07/01/16      Burnell Blanks, MD 07/01/2016 Routine  Narrative & Impression      The left ventricular ejection fraction is 50-55% by visual estimate.  The left ventricular systolic function is normal.  LV end diastolic pressure is normal.  There is no mitral valve regurgitation.  Mid RCA lesion, 100 %stenosed.  A STENT SYNERGY DES 3X20 drug eluting stent was successfully placed.  Prox Cx to Mid Cx lesion, 100 %stenosed.  Post intervention, there is a 0% residual stenosis.  1. Acute lateral MI secondary to occluded Circumflex (NSTEMI) 2. Chronic occlusion proximal RCA with filling of distal vessel from left to right collaterals.  3. Preserved LV systolic function 4. Successful PTCA/DES x 1 mid Circumflex.  Recommendations: DAPT for one year with ASA/Brilinta. Continue statin. Beta blocker as tolerated     Assessment/Plan:  1. Recent admission for chest pain  with neg evaluation - medicines restarted. Would follow for now.   2. CAD with prior Non-ST elevation myocardial infarction - s/p DES to circumflex from December 2017. No intervention to chronically occluded right coronary with collateral blood flow. Preserved LV function on catheterization. - Continue with dual antiplatelet therapy, aspirin, Brilinta for one year. - She is not on beta blocker because of hypotension and allergy (she says rash, turned colors) - Encouraged continued tobacco cessation, marijuana cessation, weight loss, diabetic control, etc. She does seem motivated to make changes.   3. Morbid obesity -she is down a few pounds. Seems motivated.   4. Diabetes  5. Essential hypertension - restarting her ARB today.   6. Substance abuse - not discussed today  7. Psyche disorder/bipolar - seeing her psyche provider this week.   8. HLD  9. Stress - very sad situation.   10 Recent elevation in TSH - she tells she was put on thyroid medicine in the past and that "that was the wrong thing for me". She has been made aware of current TSh and she will discuss with PCP.    Current medicines are reviewed with the patient today.  The patient does not have concerns regarding medicines other than what has been noted above.  The following changes have been made:  See above.  Labs/ tests ordered today include:   No orders of the defined types were placed in this encounter.    Disposition:   FU with me in 3 months.   Patient is agreeable to this plan and will call if any problems develop in the interim.   SignedTruitt Merle, NP  03/28/2017 12:06 PM  [facility] [address] [place], [place]  [address] Phone: [phone] Fax: [phone]

## 2017-04-11 DIAGNOSIS — J449 Chronic obstructive pulmonary disease, unspecified: Secondary | ICD-10-CM | POA: Diagnosis not present

## 2017-04-20 ENCOUNTER — Encounter (HOSPITAL_COMMUNITY): Payer: Self-pay | Admitting: Emergency Medicine

## 2017-04-20 ENCOUNTER — Emergency Department (HOSPITAL_COMMUNITY)
Admission: EM | Admit: 2017-04-20 | Discharge: 2017-04-20 | Disposition: A | Discharge status: Home / Self Care | Payer: Medicare HMO | Attending: Emergency Medicine | Admitting: Emergency Medicine

## 2017-04-20 ENCOUNTER — Emergency Department (HOSPITAL_COMMUNITY): Payer: Medicare HMO

## 2017-04-20 DIAGNOSIS — Y9389 Activity, other specified: Secondary | ICD-10-CM | POA: Insufficient documentation

## 2017-04-20 DIAGNOSIS — I2510 Atherosclerotic heart disease of native coronary artery without angina pectoris: Secondary | ICD-10-CM | POA: Diagnosis not present

## 2017-04-20 DIAGNOSIS — Y929 Unspecified place or not applicable: Secondary | ICD-10-CM | POA: Insufficient documentation

## 2017-04-20 DIAGNOSIS — J449 Chronic obstructive pulmonary disease, unspecified: Secondary | ICD-10-CM | POA: Insufficient documentation

## 2017-04-20 DIAGNOSIS — E119 Type 2 diabetes mellitus without complications: Secondary | ICD-10-CM | POA: Insufficient documentation

## 2017-04-20 DIAGNOSIS — I509 Heart failure, unspecified: Secondary | ICD-10-CM | POA: Diagnosis not present

## 2017-04-20 DIAGNOSIS — Y998 Other external cause status: Secondary | ICD-10-CM | POA: Diagnosis not present

## 2017-04-20 DIAGNOSIS — S99922A Unspecified injury of left foot, initial encounter: Secondary | ICD-10-CM | POA: Diagnosis not present

## 2017-04-20 DIAGNOSIS — Z7982 Long term (current) use of aspirin: Secondary | ICD-10-CM | POA: Insufficient documentation

## 2017-04-20 DIAGNOSIS — E039 Hypothyroidism, unspecified: Secondary | ICD-10-CM | POA: Diagnosis not present

## 2017-04-20 DIAGNOSIS — F17210 Nicotine dependence, cigarettes, uncomplicated: Secondary | ICD-10-CM | POA: Insufficient documentation

## 2017-04-20 DIAGNOSIS — S93602A Unspecified sprain of left foot, initial encounter: Secondary | ICD-10-CM | POA: Insufficient documentation

## 2017-04-20 DIAGNOSIS — M79672 Pain in left foot: Secondary | ICD-10-CM | POA: Diagnosis not present

## 2017-04-20 DIAGNOSIS — I252 Old myocardial infarction: Secondary | ICD-10-CM | POA: Diagnosis not present

## 2017-04-20 DIAGNOSIS — W010XXA Fall on same level from slipping, tripping and stumbling without subsequent striking against object, initial encounter: Secondary | ICD-10-CM | POA: Diagnosis not present

## 2017-04-20 DIAGNOSIS — M7989 Other specified soft tissue disorders: Secondary | ICD-10-CM | POA: Diagnosis not present

## 2017-04-20 DIAGNOSIS — R69 Illness, unspecified: Secondary | ICD-10-CM | POA: Diagnosis not present

## 2017-04-20 DIAGNOSIS — I110 Hypertensive heart disease with heart failure: Secondary | ICD-10-CM | POA: Insufficient documentation

## 2017-04-20 LAB — GLUCOSE, CAPILLARY: Glucose-Capillary: 83 mg/dL (ref 65–99)

## 2017-04-20 MED ORDER — ACETAMINOPHEN 500 MG PO TABS: 1000.0000 mg | ORAL_TABLET | Freq: Once | ORAL | Status: DC

## 2017-04-20 NOTE — ED Triage Notes (Signed)
Pt c/o falling 2 days ago and pain to left foot since. Anterior foot swelling noted. NAD

## 2017-04-20 NOTE — Discharge Instructions (Signed)
Please use the ankle stirrup splint over the next7 days. Please keep your foot elevated above your waist when at rest. Apply an ice pack over the next couple of days. Use Tylenol every 4 hours as needed for pain or discomfort. Please see Dr. Aline Brochure for orthopedic evaluation if not improving.

## 2017-04-20 NOTE — ED Provider Notes (Signed)
[facility] EMERGENCY DEPARTMENT Provider Note   CSN: [id number] Arrival date & time: 04/20/17  1942     History   Chief Complaint Chief Complaint  Patient presents with  . Foot Pain    HPI Kaitlyn Chambers is a 36 y.o. female.  Patient is a 36 year old female who presents to the emergency department with injury to the left foot.  The patient states that approximately 4:00 Wednesday, October 17 she tripped over an object in the floor and hyperextended the toes of her left foot and injured her left foot and ankle. She tried conservative measures including ice and Tylenol, she noted some swelling on top of her foot and became concerned she could possibly have a fracture. She presents to the emergency department for evaluation at this time. The previous operation or procedure involving the left foot and ankle.      Past Medical History:  Diagnosis Date  . Anxiety   . Bipolar 1 disorder, manic, full remission ([place])   . CHF (congestive heart failure) ([place])   . COPD (chronic obstructive pulmonary disease) ([place])   . Coronary artery disease   . Depression   . GERD (gastroesophageal reflux disease)   . Gout   . High cholesterol   . Hypertension   . Hypothyroidism   . Metabolic syndrome   . NSTEMI (non-ST elevated myocardial infarction) ([place]) 07/01/2016  . On home oxygen therapy    "available; don't use it" (07/01/2016)  . PCOS (polycystic ovarian syndrome)   . Stomach ulcer   . Type 2 diabetes, diet controlled [facility])     Patient Active Problem List   Diagnosis Date Noted  . Dyslipidemia 03/15/2017  . Leukocytosis 03/15/2017  . CAD (coronary artery disease) 03/15/2017  . Diabetes mellitus ([place])   . Chest pain 07/01/2016  . Morbid obesity ([place]) 07/01/2016  . PCOS (polycystic ovarian syndrome) 07/01/2016  . Hypertension 07/01/2016  . Bipolar 1 disorder ([place])   . NSTEMI (non-ST elevated myocardial infarction) [facility])     Past Surgical History:  Procedure Laterality  Date  . CARDIAC CATHETERIZATION N/A 07/01/2016   Procedure: LEFT HEART CATH AND CORONARY ANGIOGRAPHY;  Surgeon: Burnell Blanks, MD;  Location: [place] CV LAB;  Service: Cardiovascular;  Laterality: N/A;  . CARDIAC CATHETERIZATION N/A 07/01/2016   Procedure: Coronary Stent Intervention;  Surgeon: Burnell Blanks, MD;  Location: [place] CV LAB;  Service: Cardiovascular;  Laterality: N/A;  . CARPAL TUNNEL RELEASE Bilateral   . CERVICAL BIOPSY  W/ LOOP ELECTRODE EXCISION  05/2016   "precancerous cells"  . CORONARY ANGIOPLASTY WITH STENT PLACEMENT  07/01/2016  . TONSILLECTOMY      OB History    Gravida Para Term Preterm AB Living   1   0 0 1 0   SAB TAB Ectopic Multiple Live Births   1 0 0 0         Home Medications    Prior to Admission medications   Medication Sig Start Date End Date Taking? Authorizing Provider  aspirin EC 81 MG EC tablet Take 1 tablet (81 mg total) by mouth daily. 07/04/16   Hongalgi, Lenis Dickinson, MD  atorvastatin (LIPITOR) 80 MG tablet Take 1 tablet (80 mg total) by mouth daily at 6 PM. 03/21/17   Skains, Thana Farr, MD  BREO ELLIPTA 100-25 MCG/INH AEPB Inhale 1 puff into the lungs daily. 03/28/17   Burtis Junes, NP  fenofibrate (TRICOR) 145 MG tablet Take 1 tablet (145 mg total) by mouth daily.  03/15/17   Geradine Girt, DO  FLUoxetine (PROZAC) 40 MG capsule Take 1 capsule (40 mg total) by mouth at bedtime. 03/15/17   Geradine Girt, DO  furosemide (LASIX) 40 MG tablet Take 1 tablet (40 mg total) by mouth daily as needed. 03/15/17 06/13/17  Geradine Girt, DO  irbesartan (AVAPRO) 75 MG tablet Take 1 tablet (75 mg total) by mouth daily. 03/28/17   Burtis Junes, NP  nitroGLYCERIN (NITROSTAT) 0.4 MG SL tablet Place 1 tablet (0.4 mg total) under the tongue every 5 (five) minutes as needed for chest pain. 02/27/17 05/28/17  Jerline Pain, MD  ticagrelor (BRILINTA) 90 MG TABS tablet Take 1 tablet (90 mg total) by mouth 2 (two) times daily. 03/15/17    Geradine Girt, DO  traZODone (DESYREL) 100 MG tablet Take 1 tablet (100 mg total) by mouth daily as needed for sleep. 03/15/17   Geradine Girt, DO    Family History Family History  Problem Relation Age of Onset  . Diabetes Mother   . Heart failure Mother   . Heart attack Mother 43  . Heart disease Mother   . Healthy Son     Social History Social History  Substance Use Topics  . Smoking status: Current Every Day Smoker    Packs/day: 0.50    Years: 26.00    Types: Cigarettes  . Smokeless tobacco: Never Used  . Alcohol use No     Allergies   Levaquin [levofloxacin] and Metoprolol   Review of Systems Review of Systems  Constitutional: Negative for activity change.       All ROS Neg except as noted in HPI  HENT: Negative for nosebleeds.   Eyes: Negative for photophobia and discharge.  Respiratory: Negative for cough, shortness of breath and wheezing.   Cardiovascular: Negative for chest pain and palpitations.  Gastrointestinal: Negative for abdominal pain and blood in stool.  Genitourinary: Negative for dysuria, frequency and hematuria.  Musculoskeletal: Positive for arthralgias. Negative for back pain and neck pain.  Skin: Negative.   Neurological: Negative for dizziness, seizures and speech difficulty.  Psychiatric/Behavioral: Negative for confusion and hallucinations.     Physical Exam Updated Vital Signs BP (!) 157/99 (BP Location: Right Arm)   Pulse 78   Temp 98.7 F (37.1 C) (Oral)   Resp 19   Ht 5\' 7"  (1.702 m)   Wt 136.1 kg (300 lb)   LMP 03/23/2017   SpO2 98%   BMI 46.99 kg/m   Physical Exam  Constitutional: She is oriented to person, place, and time. She appears well-developed and well-nourished.  Non-toxic appearance.  HENT:  Head: Normocephalic.  Right Ear: Tympanic membrane and external ear normal.  Left Ear: Tympanic membrane and external ear normal.  Eyes: Pupils are equal, round, and reactive to light. EOM and lids are normal.  Neck:  Normal range of motion. Neck supple. Carotid bruit is not present.  Cardiovascular: Normal rate, regular rhythm, normal heart sounds, intact distal pulses and normal pulses.   Pulmonary/Chest: Breath sounds normal. No respiratory distress.  Abdominal: Soft. Bowel sounds are normal. There is no tenderness. There is no guarding.  Musculoskeletal: Normal range of motion.  There is full range of motion of the left hip and knee. There is no deformity of the lower leg on the left. There is soreness with range of motion of the left ankle. The Achilles tendon is intact. There is some mild swelling and pain to palpation of the dorsum of the  left foot. There is pain with movement of the toes of the left foot. Capillary refill is less than 2 seconds.  Lymphadenopathy:       Head (right side): No submandibular adenopathy present.       Head (left side): No submandibular adenopathy present.    She has no cervical adenopathy.  Neurological: She is alert and oriented to person, place, and time. She has normal strength. No cranial nerve deficit or sensory deficit.  Skin: Skin is warm and dry.  Psychiatric: She has a normal mood and affect. Her speech is normal.  Nursing note and vitals reviewed.    ED Treatments / Results  Labs (all labs ordered are listed, but only abnormal results are displayed) Labs Reviewed  GLUCOSE, CAPILLARY    EKG  EKG Interpretation None       Radiology Dg Foot Complete Left  Result Date: 04/20/2017 CLINICAL DATA:  Fall with pain and swelling to the metatarsals EXAM: LEFT FOOT - COMPLETE 3+ VIEW COMPARISON:  None. FINDINGS: There is no evidence of fracture or dislocation. There is no evidence of arthropathy or other focal bone abnormality. Distal dorsal soft tissue swelling. IMPRESSION: Soft tissue swelling.  No definite acute osseous abnormality. Electronically Signed   By: Donavan Foil M.D.   On: 04/20/2017 20:23    Procedures Procedures (including critical care  time)  Medications Ordered in ED Medications - No data to display   Initial Impression / Assessment and Plan / ED Course  I have reviewed the triage vital signs and the nursing notes.  Pertinent labs & imaging results that were available during my care of the patient were reviewed by me and considered in my medical decision making (see chart for details).      Final Clinical Impressions(s) / ED Diagnoses MDM Blood pressure is elevated at 157/99, otherwise the vital signs within normal limits. X-ray of the left foot is negative for fracture or dislocation. There is some mild soft tissue swelling at the dorsum of the foot.  No gross neurovascular deficits appreciated.  Patient fitted with an ankle stirrup splint, also provided an ice pack. Patient states she uses Tylenol, cut she's been told not to use ibuprofen since a cardiac procedure. I've asked the patient to see orthopedics for additional evaluation if not improving.   Final diagnoses:  Foot sprain, left, initial encounter    New Prescriptions New Prescriptions   No medications on file     Lily Kocher, Hershal Coria 04/20/17 2150    Francine Graven, DO 04/22/17 1733

## 2017-05-12 DIAGNOSIS — J449 Chronic obstructive pulmonary disease, unspecified: Secondary | ICD-10-CM | POA: Diagnosis not present

## 2017-05-29 ENCOUNTER — Telehealth: Payer: Self-pay | Admitting: Nurse Practitioner

## 2017-05-29 NOTE — Telephone Encounter (Signed)
[facility] medication management calling to add meds to pt's med list-Gabapentin 300mg  bid, Latuda 20mg  1qd, and Larazapan 1qd prn

## 2017-05-30 NOTE — Telephone Encounter (Signed)
Medication list updated with these additions.  Lorazepam 1 mg QD Gabapentin 300 mg BID Latuda 20 mg QD

## 2017-06-11 DIAGNOSIS — J449 Chronic obstructive pulmonary disease, unspecified: Secondary | ICD-10-CM | POA: Diagnosis not present

## 2017-06-19 ENCOUNTER — Ambulatory Visit: Payer: Medicare HMO | Admitting: Nurse Practitioner

## 2017-06-19 NOTE — Progress Notes (Deleted)
CARDIOLOGY OFFICE NOTE  Date:  06/19/2017    Kaitlyn Chambers Date of Birth: [birth year] Medical Record #[id number]  PCP:  Patient, No Pcp Per  Cardiologist:  [facility]    No chief complaint on file.   History of Present Illness: Kaitlyn Chambers is a 36 y.o. female who presents today for a 3 month check. Seen for Dr. [name].   She has a PMH of morbid obesity, PCOS/metabolic syndrome on metformin (denied history of DM prior to admission), HTN, bipolar disorder, COPD, tobacco &THC abuse.   [facility] with substernal chest pain. EMS was subsequently called and was given sublingual NTG which relieved her pain and she did not want to be transported to the hospital. She then had worsening chest pain that awakened her from sleep and she called 911 again. Family history of MI in mother at age 80 who had stenting. Cardiology assessed her with NSTEMIand she underwent cardiac cath and had DESto circumflex on 07/01/16.  I saw her back in January - some anxiety. Seemed to be on track and motivated to make changes. Saw Mickel Baas in February - having chest pain. Saw Dr. [name] back in May - felt to be doing ok - some atypical chest pain but stable from our standpoint.   In the ER in September - having chest pain in setting of significant stress - got admitted - seen by Dr. Angelena Form - EKG negative. Troponin negative. She had been out of her medicines - these were restarted and she was sent home to follow back up here. I last saw her in September - she was doing better - lots of stress.   Comes in today. Here alone.   Past Medical History:  Diagnosis Date  . Anxiety   . Bipolar 1 disorder, manic, full remission ([place])   . CHF (congestive heart failure) ([place])   . COPD (chronic obstructive pulmonary disease) ([place])   . Coronary artery disease   . Depression   . GERD (gastroesophageal reflux disease)   . Gout   . High cholesterol   . Hypertension   .  Hypothyroidism   . Metabolic syndrome   . NSTEMI (non-ST elevated myocardial infarction) ([place]) 07/01/2016  . On home oxygen therapy    "available; don't use it" (07/01/2016)  . PCOS (polycystic ovarian syndrome)   . Stomach ulcer   . Type 2 diabetes, diet controlled ([place])     Past Surgical History:  Procedure Laterality Date  . CARDIAC CATHETERIZATION N/A 07/01/2016   Procedure: LEFT HEART CATH AND CORONARY ANGIOGRAPHY;  Surgeon: Burnell Blanks, MD;  Location: [facility];  Service: Cardiovascular;  Laterality: N/A;  . CARDIAC CATHETERIZATION N/A 07/01/2016   Procedure: Coronary Stent Intervention;  Surgeon: Burnell Blanks, MD;  Location: [place] [facility];  Service: Cardiovascular;  Laterality: N/A;  . CARPAL TUNNEL RELEASE Bilateral   . CERVICAL BIOPSY  W/ LOOP ELECTRODE EXCISION  05/2016   "precancerous cells"  . CORONARY ANGIOPLASTY WITH STENT PLACEMENT  07/01/2016  . TONSILLECTOMY       Medications: No outpatient medications have been marked as taking for the 06/19/17 encounter (Appointment) with Burtis Junes, NP.     Allergies: Allergies  Allergen Reactions  . Levaquin [Levofloxacin] Rash  . Metoprolol Nausea And Vomiting and Other (See Comments)    Sweats and dizziness    Social History: The patient  reports that she has been smoking cigarettes.  She has a  13.00 pack-year smoking history. she has never used smokeless tobacco. She reports that she uses drugs. Drug: Marijuana. She reports that she does not drink alcohol.   Family History: The patient's family history includes Diabetes in her mother; Healthy in her son; Heart attack (age of onset: 76) in her mother; Heart disease in her mother; Heart failure in her mother.   Review of Systems: Please see the history of present illness.   Otherwise, the review of systems is positive for none.   All other systems are reviewed and negative.   Physical Exam: VS:  There were no vitals taken  for this visit. Marland Kitchen  BMI There is no height or weight on file to calculate BMI.  Wt Readings from Last 3 Encounters:  04/20/17 300 lb (136.1 kg)  03/28/17 (!) 321 lb 1.9 oz (145.7 kg)  03/15/17 (!) 326 lb 4.5 oz (148 kg)    General: Pleasant. Well developed, well nourished and in no acute distress.   HEENT: Normal.  Neck: Supple, no JVD, carotid bruits, or masses noted.  Cardiac: Regular rate and rhythm. No murmurs, rubs, or gallops. No edema.  Respiratory:  Lungs are clear to auscultation bilaterally with normal work of breathing.  GI: Soft and nontender.  MS: No deformity or atrophy. Gait and ROM intact.  Skin: Warm and dry. Color is normal.  Neuro:  Strength and sensation are intact and no gross focal deficits noted.  Psych: Alert, appropriate and with normal affect.   LABORATORY DATA:  EKG:  EKG is not ordered today.  Lab Results  Component Value Date   WBC 8.3 03/15/2017   HGB 13.5 03/15/2017   HCT 39.5 03/15/2017   PLT 222 03/15/2017   GLUCOSE 111 (H) 03/15/2017   CHOL 193 03/15/2017   TRIG 405 (H) 03/15/2017   HDL 26 (L) 03/15/2017   LDLCALC UNABLE TO CALCULATE IF TRIGLYCERIDE OVER 400 mg/dL 03/15/2017   ALT 18 12/07/2011   AST 19 12/07/2011   NA 138 03/15/2017   K 3.7 03/15/2017   CL 108 03/15/2017   CREATININE 0.87 03/15/2017   BUN 11 03/15/2017   CO2 24 03/15/2017   TSH 7.028 (H) 03/15/2017   INR 1.01 07/01/2016   HGBA1C 6.7 (H) 07/01/2016     BNP (last 3 results) No results for input(s): BNP in the last 8760 hours.  ProBNP (last 3 results) No results for input(s): PROBNP in the last 8760 hours.   Other Studies Reviewed Today:   Assessment/Plan: Cardiac catheterization: 07/01/16      Burnell Blanks, MD 07/01/2016 Routine  Narrative & Impression      The left ventricular ejection fraction is 50-55% by visual estimate.  The left ventricular systolic function is normal.  LV end diastolic pressure is normal.  There is no  mitral valve regurgitation.  Mid RCA lesion, 100 %stenosed.  A STENT SYNERGY DES 3X20 drug eluting stent was successfully placed.  Prox Cx to Mid Cx lesion, 100 %stenosed.  Post intervention, there is a 0% residual stenosis.  1. Acute lateral MI secondary to occluded Circumflex (NSTEMI) 2. Chronic occlusion proximal RCA with filling of distal vessel from left to right collaterals.  3. Preserved LV systolic function 4. Successful PTCA/DES x 1 mid Circumflex.  Recommendations: DAPT for one year with ASA/Brilinta. Continue statin. Beta blocker as tolerated     Assessment/Plan:  1. Recent admission for chest pain with neg evaluation - medicines restarted. Would follow for now.   2. CAD with prior Non-ST  elevation myocardial infarction - s/p DES to circumflex from December 2017. No intervention to chronically occluded right coronary with collateral blood flow. Preserved LV function on catheterization. - Continue with dual antiplatelet therapy, aspirin, Brilinta for one year. - She is not onbeta blocker because of hypotension and allergy (she says rash, turned colors) - Encouraged continued tobacco cessation, marijuana cessation, weight loss, diabetic control, etc. She does seem motivated to make changes.   3. Morbid obesity -she is down a few pounds. Seems motivated.   4. Diabetes  5. Essential hypertension - restarting her ARB today.   6. Substance abuse - not discussed today  7. Psyche disorder/bipolar - seeing her psyche provider this week.   8. HLD  9. Stress - very sad situation.   10 Recent elevation in TSH - she tells she was put on thyroid medicine in the past and that "that was the wrong thing for me". She has been made aware of current TSh and she will discuss with PCP.     Current medicines are reviewed with the patient today.  The patient does not have concerns regarding medicines other than what has been noted above.  The following  changes have been made:  See above.  Labs/ tests ordered today include:   No orders of the defined types were placed in this encounter.    Disposition:   FU with *** in {gen number [id number]} {Days to years:10300}.   Patient is agreeable to this plan and will call if any problems develop in the interim.   SignedTruitt Merle, NP  06/19/2017 7:59 AM  [facility] [address] [place], [place]  [address] Phone: [phone] Fax: [phone]

## 2017-06-22 ENCOUNTER — Encounter: Payer: Self-pay | Admitting: Nurse Practitioner

## 2017-07-11 DIAGNOSIS — E785 Hyperlipidemia, unspecified: Secondary | ICD-10-CM | POA: Diagnosis not present

## 2017-07-11 DIAGNOSIS — I2510 Atherosclerotic heart disease of native coronary artery without angina pectoris: Secondary | ICD-10-CM | POA: Diagnosis not present

## 2017-07-11 DIAGNOSIS — E119 Type 2 diabetes mellitus without complications: Secondary | ICD-10-CM | POA: Diagnosis not present

## 2017-07-11 DIAGNOSIS — I10 Essential (primary) hypertension: Secondary | ICD-10-CM | POA: Diagnosis not present

## 2017-07-12 DIAGNOSIS — J449 Chronic obstructive pulmonary disease, unspecified: Secondary | ICD-10-CM | POA: Diagnosis not present

## 2017-08-12 DIAGNOSIS — J449 Chronic obstructive pulmonary disease, unspecified: Secondary | ICD-10-CM | POA: Diagnosis not present

## 2017-09-09 DIAGNOSIS — J449 Chronic obstructive pulmonary disease, unspecified: Secondary | ICD-10-CM | POA: Diagnosis not present

## 2017-10-10 DIAGNOSIS — J449 Chronic obstructive pulmonary disease, unspecified: Secondary | ICD-10-CM | POA: Diagnosis not present

## 2017-11-09 DIAGNOSIS — J449 Chronic obstructive pulmonary disease, unspecified: Secondary | ICD-10-CM | POA: Diagnosis not present

## 2017-12-06 DIAGNOSIS — F4310 Post-traumatic stress disorder, unspecified: Secondary | ICD-10-CM | POA: Diagnosis not present

## 2017-12-06 DIAGNOSIS — R69 Illness, unspecified: Secondary | ICD-10-CM | POA: Diagnosis not present

## 2017-12-07 DIAGNOSIS — R69 Illness, unspecified: Secondary | ICD-10-CM | POA: Diagnosis not present

## 2017-12-07 DIAGNOSIS — F900 Attention-deficit hyperactivity disorder, predominantly inattentive type: Secondary | ICD-10-CM | POA: Diagnosis not present

## 2017-12-07 DIAGNOSIS — F411 Generalized anxiety disorder: Secondary | ICD-10-CM | POA: Diagnosis not present

## 2017-12-07 DIAGNOSIS — F515 Nightmare disorder: Secondary | ICD-10-CM | POA: Diagnosis not present

## 2017-12-10 DIAGNOSIS — J449 Chronic obstructive pulmonary disease, unspecified: Secondary | ICD-10-CM | POA: Diagnosis not present

## 2017-12-19 DIAGNOSIS — F4310 Post-traumatic stress disorder, unspecified: Secondary | ICD-10-CM | POA: Diagnosis not present

## 2017-12-19 DIAGNOSIS — R69 Illness, unspecified: Secondary | ICD-10-CM | POA: Diagnosis not present

## 2018-01-03 DIAGNOSIS — R69 Illness, unspecified: Secondary | ICD-10-CM | POA: Diagnosis not present

## 2018-01-03 DIAGNOSIS — F411 Generalized anxiety disorder: Secondary | ICD-10-CM | POA: Diagnosis not present

## 2018-01-03 DIAGNOSIS — F4310 Post-traumatic stress disorder, unspecified: Secondary | ICD-10-CM | POA: Diagnosis not present

## 2018-01-03 DIAGNOSIS — F901 Attention-deficit hyperactivity disorder, predominantly hyperactive type: Secondary | ICD-10-CM | POA: Diagnosis not present

## 2018-01-03 DIAGNOSIS — F5101 Primary insomnia: Secondary | ICD-10-CM | POA: Diagnosis not present

## 2018-01-09 DIAGNOSIS — J449 Chronic obstructive pulmonary disease, unspecified: Secondary | ICD-10-CM | POA: Diagnosis not present

## 2018-01-18 ENCOUNTER — Ambulatory Visit (INDEPENDENT_AMBULATORY_CARE_PROVIDER_SITE_OTHER): Payer: Medicare HMO | Admitting: Obstetrics & Gynecology

## 2018-01-18 ENCOUNTER — Other Ambulatory Visit (HOSPITAL_COMMUNITY)
Admission: RE | Admit: 2018-01-18 | Discharge: 2018-01-18 | Disposition: A | Discharge status: Home / Self Care | Payer: Medicare HMO | Source: Ambulatory Visit | Attending: Obstetrics & Gynecology | Admitting: Obstetrics & Gynecology

## 2018-01-18 ENCOUNTER — Encounter: Payer: Self-pay | Admitting: Obstetrics & Gynecology

## 2018-01-18 VITALS — BP 143/89 | HR 82 | Ht 67.0 in | Wt 336.0 lb

## 2018-01-18 DIAGNOSIS — F4310 Post-traumatic stress disorder, unspecified: Secondary | ICD-10-CM | POA: Diagnosis not present

## 2018-01-18 DIAGNOSIS — Z01419 Encounter for gynecological examination (general) (routine) without abnormal findings: Secondary | ICD-10-CM | POA: Diagnosis not present

## 2018-01-18 DIAGNOSIS — Z124 Encounter for screening for malignant neoplasm of cervix: Secondary | ICD-10-CM | POA: Diagnosis not present

## 2018-01-18 DIAGNOSIS — N938 Other specified abnormal uterine and vaginal bleeding: Secondary | ICD-10-CM | POA: Diagnosis not present

## 2018-01-18 DIAGNOSIS — R69 Illness, unspecified: Secondary | ICD-10-CM | POA: Diagnosis not present

## 2018-01-18 MED ORDER — MEGESTROL ACETATE 40 MG PO TABS: ORAL_TABLET | ORAL | 3 refills | Status: DC

## 2018-01-18 NOTE — Addendum Note (Signed)
Addended by: Gaylyn Rong A on: 01/18/2018 09:44 AM   Modules accepted: Orders

## 2018-01-18 NOTE — Progress Notes (Signed)
Chief Complaint  Patient presents with  . ney gyn    period late, clots and dark. painful/7-8 to7-16      37 y.o. G1P0010 Patient's last menstrual period was 01/08/2018. The current method of family planning is none.  Outpatient Encounter Medications as of 01/18/2018  Medication Sig  . aspirin EC 81 MG EC tablet Take 1 tablet (81 mg total) by mouth daily.  Marland Kitchen FLUoxetine (PROZAC) 40 MG capsule Take 1 capsule (40 mg total) by mouth at bedtime.  Marland Kitchen lurasidone (LATUDA) 20 MG TABS tablet Take 20 mg by mouth daily.  . ticagrelor (BRILINTA) 90 MG TABS tablet Take 1 tablet (90 mg total) by mouth 2 (two) times daily.  Marland Kitchen atorvastatin (LIPITOR) 80 MG tablet Take 1 tablet (80 mg total) by mouth daily at 6 PM. (Patient not taking: Reported on 01/18/2018)  . BREO ELLIPTA 100-25 MCG/INH AEPB Inhale 1 puff into the lungs daily. (Patient not taking: Reported on 01/18/2018)  . fenofibrate (TRICOR) 145 MG tablet Take 1 tablet (145 mg total) by mouth daily. (Patient not taking: Reported on 01/18/2018)  . furosemide (LASIX) 40 MG tablet Take 1 tablet (40 mg total) by mouth daily as needed.  . gabapentin (NEURONTIN) 300 MG capsule Take 300 mg by mouth 2 (two) times daily.  . irbesartan (AVAPRO) 75 MG tablet Take 1 tablet (75 mg total) by mouth daily. (Patient not taking: Reported on 01/18/2018)  . megestrol (MEGACE) 40 MG tablet 3 tablets a day for 5 days, 2 tablets a day for 5 days then 1 tablet daily  . nitroGLYCERIN (NITROSTAT) 0.4 MG SL tablet Place 1 tablet (0.4 mg total) under the tongue every 5 (five) minutes as needed for chest pain.  . traZODone (DESYREL) 100 MG tablet Take 1 tablet (100 mg total) by mouth daily as needed for sleep. (Patient not taking: Reported on 01/18/2018)  . [DISCONTINUED] LORazepam (ATIVAN) 1 MG tablet Take 1 mg by mouth daily as needed for anxiety.   No facility-administered encounter medications on file as of 01/18/2018.     Subjective Lifelong history of irregular  periods, skipping followed by prolonged bleeding Anovulatory was stimulated without success Period this time for 1 weeks heavy clots soiling Heavy cramps Past Medical History:  Diagnosis Date  . Anxiety   . Bipolar 1 disorder, manic, full remission ([place])   . CHF (congestive heart failure) ([place])   . COPD (chronic obstructive pulmonary disease) ([place])   . Coronary artery disease   . Depression   . GERD (gastroesophageal reflux disease)   . Gout   . High cholesterol   . Hypertension   . Hypothyroidism   . Metabolic syndrome   . NSTEMI (non-ST elevated myocardial infarction) ([place]) 07/01/2016  . On home oxygen therapy    "available; don't use it" (07/01/2016)  . PCOS (polycystic ovarian syndrome)   . Stomach ulcer   . Type 2 diabetes, diet controlled ([place])     Past Surgical History:  Procedure Laterality Date  . CARDIAC CATHETERIZATION N/A 07/01/2016   Procedure: LEFT HEART CATH AND CORONARY ANGIOGRAPHY;  Surgeon: Burnell Blanks, MD;  Location: [facility];  Service: Cardiovascular;  Laterality: N/A;  . CARDIAC CATHETERIZATION N/A 07/01/2016   Procedure: Coronary Stent Intervention;  Surgeon: Burnell Blanks, MD;  Location: [facility];  Service: Cardiovascular;  Laterality: N/A;  . CARPAL TUNNEL RELEASE Bilateral   . CERVICAL BIOPSY  W/ LOOP ELECTRODE EXCISION  05/2016   "precancerous cells"  .  CORONARY ANGIOPLASTY WITH STENT PLACEMENT  07/01/2016  . TONSILLECTOMY      OB History    Gravida  1   Para      Term  0   Preterm  0   AB  1   Living  0     SAB  1   TAB  0   Ectopic  0   Multiple  0   Live Births              Allergies  Allergen Reactions  . Levaquin [Levofloxacin] Rash  . Metoprolol Nausea And Vomiting and Other (See Comments)    Sweats and dizziness    Social History   Socioeconomic History  . Marital status: Divorced    Spouse name: Not on file  . Number of children: Not on file  . Years of education:  Not on file  . Highest education level: Not on file  Occupational History  . Not on file  Social Needs  . Financial resource strain: Not on file  . Food insecurity:    Worry: Not on file    Inability: Not on file  . Transportation needs:    Medical: Not on file    Non-medical: Not on file  Tobacco Use  . Smoking status: Current Every Day Smoker    Packs/day: 0.50    Years: 26.00    Pack years: 13.00    Types: Cigarettes  . Smokeless tobacco: Never Used  Substance and Sexual Activity  . Alcohol use: No  . Drug use: Yes    Types: Marijuana    Comment: 06/28/2016 "couple times/week; if that"  . Sexual activity: Yes    Birth control/protection: None  Lifestyle  . Physical activity:    Days per week: Not on file    Minutes per session: Not on file  . Stress: Not on file  Relationships  . Social connections:    Talks on phone: Not on file    Gets together: Not on file    Attends religious service: Not on file    Active member of club or organization: Not on file    Attends meetings of clubs or organizations: Not on file    Relationship status: Not on file  Other Topics Concern  . Not on file  Social History Narrative  . Not on file    Family History  Problem Relation Age of Onset  . Diabetes Mother   . Heart failure Mother   . Heart attack Mother 60  . Heart disease Mother   . Healthy Son     Medications:       Current Outpatient Medications:  .  aspirin EC 81 MG EC tablet, Take 1 tablet (81 mg total) by mouth daily., Disp: 30 tablet, Rfl: 0 .  FLUoxetine (PROZAC) 40 MG capsule, Take 1 capsule (40 mg total) by mouth at bedtime., Disp: 30 capsule, Rfl: 0 .  lurasidone (LATUDA) 20 MG TABS tablet, Take 20 mg by mouth daily., Disp: , Rfl:  .  ticagrelor (BRILINTA) 90 MG TABS tablet, Take 1 tablet (90 mg total) by mouth 2 (two) times daily., Disp: 60 tablet, Rfl: 0 .  atorvastatin (LIPITOR) 80 MG tablet, Take 1 tablet (80 mg total) by mouth daily at 6 PM. (Patient not  taking: Reported on 01/18/2018), Disp: 90 tablet, Rfl: 2 .  BREO ELLIPTA 100-25 MCG/INH AEPB, Inhale 1 puff into the lungs daily. (Patient not taking: Reported on 01/18/2018), Disp: 1 each, Rfl:  0 .  fenofibrate (TRICOR) 145 MG tablet, Take 1 tablet (145 mg total) by mouth daily. (Patient not taking: Reported on 01/18/2018), Disp: 30 tablet, Rfl: 0 .  furosemide (LASIX) 40 MG tablet, Take 1 tablet (40 mg total) by mouth daily as needed., Disp: 15 tablet, Rfl: 0 .  gabapentin (NEURONTIN) 300 MG capsule, Take 300 mg by mouth 2 (two) times daily., Disp: , Rfl:  .  irbesartan (AVAPRO) 75 MG tablet, Take 1 tablet (75 mg total) by mouth daily. (Patient not taking: Reported on 01/18/2018), Disp: 30 tablet, Rfl: 6 .  megestrol (MEGACE) 40 MG tablet, 3 tablets a day for 5 days, 2 tablets a day for 5 days then 1 tablet daily, Disp: 45 tablet, Rfl: 3 .  nitroGLYCERIN (NITROSTAT) 0.4 MG SL tablet, Place 1 tablet (0.4 mg total) under the tongue every 5 (five) minutes as needed for chest pain., Disp: 25 tablet, Rfl: prn .  traZODone (DESYREL) 100 MG tablet, Take 1 tablet (100 mg total) by mouth daily as needed for sleep. (Patient not taking: Reported on 01/18/2018), Disp: 30 tablet, Rfl: 0  Objective Blood pressure (!) 143/89, pulse 82, height 5\' 7"  (1.702 m), weight (!) 336 lb (152.4 kg), last menstrual period 01/08/2018.  General WDWN female NAD Vulva:  normal appearing vulva with no masses, tenderness or lesions Vagina:  normal mucosa, no discharge Cervix:  Nulliparous no lesions Uterus:  Can't feel due to central pannus Adnexa: ovaries:,     Pertinent ROS No burning with urination, frequency or urgency No nausea, vomiting or diarrhea Nor fever chills or other constitutional symptoms   Labs or studies     Impression Diagnoses this Encounter::   ICD-10-CM   1. DUB (dysfunctional uterine bleeding) N93.8     Established relevant diagnosis(es): Morbid obesity/PCOS  Plan/Recommendations: Meds  ordered this encounter  Medications  . megestrol (MEGACE) 40 MG tablet    Sig: 3 tablets a day for 5 days, 2 tablets a day for 5 days then 1 tablet daily    Dispense:  45 tablet    Refill:  3    Labs or Scans Ordered: No orders of the defined types were placed in this encounter.   Management:: RE Synchronize endometrium with megestrol, then In 6 weeks transition to a chronic management of her endometrium  Follow up Return in about 6 weeks (around 03/01/2018) for Follow up, with Dr Elonda Husky.      All questions were answered.

## 2018-01-19 LAB — CYTOLOGY - PAP
Adequacy: ABSENT — AB
HPV: DETECTED — AB

## 2018-01-30 ENCOUNTER — Encounter: Payer: Medicare HMO | Admitting: Obstetrics & Gynecology

## 2018-01-31 DIAGNOSIS — F515 Nightmare disorder: Secondary | ICD-10-CM | POA: Diagnosis not present

## 2018-01-31 DIAGNOSIS — R69 Illness, unspecified: Secondary | ICD-10-CM | POA: Diagnosis not present

## 2018-01-31 DIAGNOSIS — F5101 Primary insomnia: Secondary | ICD-10-CM | POA: Diagnosis not present

## 2018-01-31 DIAGNOSIS — F411 Generalized anxiety disorder: Secondary | ICD-10-CM | POA: Diagnosis not present

## 2018-01-31 DIAGNOSIS — F4310 Post-traumatic stress disorder, unspecified: Secondary | ICD-10-CM | POA: Diagnosis not present

## 2018-02-09 DIAGNOSIS — J449 Chronic obstructive pulmonary disease, unspecified: Secondary | ICD-10-CM | POA: Diagnosis not present

## 2018-02-23 ENCOUNTER — Ambulatory Visit (INDEPENDENT_AMBULATORY_CARE_PROVIDER_SITE_OTHER): Payer: Medicare HMO | Admitting: Obstetrics & Gynecology

## 2018-02-23 ENCOUNTER — Other Ambulatory Visit: Payer: Self-pay | Admitting: *Deleted

## 2018-02-23 ENCOUNTER — Encounter: Payer: Self-pay | Admitting: Obstetrics & Gynecology

## 2018-02-23 VITALS — BP 132/91 | HR 79 | Ht 67.0 in | Wt 315.5 lb

## 2018-02-23 DIAGNOSIS — E8881 Metabolic syndrome: Secondary | ICD-10-CM

## 2018-02-23 DIAGNOSIS — E785 Hyperlipidemia, unspecified: Secondary | ICD-10-CM

## 2018-02-23 DIAGNOSIS — N870 Mild cervical dysplasia: Secondary | ICD-10-CM

## 2018-02-23 DIAGNOSIS — E118 Type 2 diabetes mellitus with unspecified complications: Secondary | ICD-10-CM

## 2018-02-23 DIAGNOSIS — E119 Type 2 diabetes mellitus without complications: Secondary | ICD-10-CM

## 2018-02-23 DIAGNOSIS — Z3202 Encounter for pregnancy test, result negative: Secondary | ICD-10-CM

## 2018-02-23 LAB — POCT URINE PREGNANCY: Preg Test, Ur: NEGATIVE

## 2018-02-23 MED ORDER — METFORMIN HCL 500 MG PO TABS: 500.0000 mg | ORAL_TABLET | Freq: Two times a day (BID) | ORAL | 1 refills | Status: DC

## 2018-02-23 NOTE — Progress Notes (Signed)
Pt is not taking her metformin, CBG >300 Restart her metformin and refer to Dr Dorris Fetch   Colposcopy Procedure Note:  Colposcopy Procedure Note  Indications: Pap smear 1 months ago showed: low grade dysplasia, cnnot rule out HSIL. The prior pap showed no abnormalities.  Prior cervical/vaginal disease: . Prior cervical treatment: .  Smoker:  Yes.   New sexual partner:  No.    History of abnormal Pap: no  Procedure Details  The risks and benefits of the procedure and Written informed consent obtained.  Speculum placed in vagina and excellent visualization of cervix achieved, cervix swabbed x 3 with acetic acid solution.  Findings: Cervix: no visible lesions, no mosaicism, no punctation and no abnormal vasculature; SCJ visualized 360 degrees without lesions and no biopsies taken. Vaginal inspection: vaginal colposcopy not performed. Vulvar colposcopy: vulvar colposcopy not performed.  Specimens: none  Complications: none.  Plan: Follow up Pap 1 year  Meds ordered this encounter  Medications  . metFORMIN (GLUCOPHAGE) 500 MG tablet    Sig: Take 1 tablet (500 mg total) by mouth 2 (two) times daily with a meal.    Dispense:  60 tablet    Refill:  1   Refer to Dr Dorris Fetch for management of diabetes and metabolic syndrome, staff message sent to [facility]

## 2018-02-24 DIAGNOSIS — R69 Illness, unspecified: Secondary | ICD-10-CM | POA: Diagnosis not present

## 2018-02-25 DIAGNOSIS — R69 Illness, unspecified: Secondary | ICD-10-CM | POA: Diagnosis not present

## 2018-02-27 ENCOUNTER — Telehealth: Payer: Self-pay | Admitting: *Deleted

## 2018-02-27 NOTE — Telephone Encounter (Signed)
3-4 times daily 

## 2018-02-28 DIAGNOSIS — R69 Illness, unspecified: Secondary | ICD-10-CM | POA: Diagnosis not present

## 2018-02-28 DIAGNOSIS — F411 Generalized anxiety disorder: Secondary | ICD-10-CM | POA: Diagnosis not present

## 2018-02-28 DIAGNOSIS — F515 Nightmare disorder: Secondary | ICD-10-CM | POA: Diagnosis not present

## 2018-02-28 DIAGNOSIS — F5101 Primary insomnia: Secondary | ICD-10-CM | POA: Diagnosis not present

## 2018-02-28 NOTE — Telephone Encounter (Signed)
LMOVM for glucose meter, lancets and strips to pharmacy. Will check CBG 3-4 times daily.

## 2018-03-08 DIAGNOSIS — F4310 Post-traumatic stress disorder, unspecified: Secondary | ICD-10-CM | POA: Diagnosis not present

## 2018-03-08 DIAGNOSIS — R69 Illness, unspecified: Secondary | ICD-10-CM | POA: Diagnosis not present

## 2018-03-12 DIAGNOSIS — J449 Chronic obstructive pulmonary disease, unspecified: Secondary | ICD-10-CM | POA: Diagnosis not present

## 2018-03-16 DIAGNOSIS — R69 Illness, unspecified: Secondary | ICD-10-CM | POA: Diagnosis not present

## 2018-03-16 DIAGNOSIS — F4310 Post-traumatic stress disorder, unspecified: Secondary | ICD-10-CM | POA: Diagnosis not present

## 2018-03-22 DIAGNOSIS — F4310 Post-traumatic stress disorder, unspecified: Secondary | ICD-10-CM | POA: Diagnosis not present

## 2018-03-22 DIAGNOSIS — R69 Illness, unspecified: Secondary | ICD-10-CM | POA: Diagnosis not present

## 2018-03-28 DIAGNOSIS — R69 Illness, unspecified: Secondary | ICD-10-CM | POA: Diagnosis not present

## 2018-03-28 DIAGNOSIS — F4310 Post-traumatic stress disorder, unspecified: Secondary | ICD-10-CM | POA: Diagnosis not present

## 2018-03-28 DIAGNOSIS — F515 Nightmare disorder: Secondary | ICD-10-CM | POA: Diagnosis not present

## 2018-03-28 DIAGNOSIS — F411 Generalized anxiety disorder: Secondary | ICD-10-CM | POA: Diagnosis not present

## 2018-03-28 DIAGNOSIS — F5101 Primary insomnia: Secondary | ICD-10-CM | POA: Diagnosis not present

## 2018-04-05 ENCOUNTER — Telehealth: Payer: Self-pay | Admitting: *Deleted

## 2018-04-05 ENCOUNTER — Ambulatory Visit: Payer: Medicare HMO | Admitting: "Endocrinology

## 2018-04-05 DIAGNOSIS — F4310 Post-traumatic stress disorder, unspecified: Secondary | ICD-10-CM | POA: Diagnosis not present

## 2018-04-05 DIAGNOSIS — R69 Illness, unspecified: Secondary | ICD-10-CM | POA: Diagnosis not present

## 2018-04-05 NOTE — Telephone Encounter (Signed)
[facility] said that pt was a no show today.  04-05-18  AS

## 2018-04-11 ENCOUNTER — Telehealth: Payer: Self-pay | Admitting: *Deleted

## 2018-04-11 DIAGNOSIS — J449 Chronic obstructive pulmonary disease, unspecified: Secondary | ICD-10-CM | POA: Diagnosis not present

## 2018-04-11 NOTE — Telephone Encounter (Signed)
Patient states she started spotting last night and heavier bleeding and clots started today.  Is taking 1 tablet Megace daily.  Patient is wanting to increase to 2 tablets daily. Please advise.

## 2018-04-11 NOTE — Telephone Encounter (Signed)
That is fine, bump back up to 2 tablets a day

## 2018-04-11 NOTE — Telephone Encounter (Signed)
Advised patient per Dr Elonda Husky she can increase up to 2 tabs daily of Megace. Verbalized understanding.

## 2018-04-18 DIAGNOSIS — R69 Illness, unspecified: Secondary | ICD-10-CM | POA: Diagnosis not present

## 2018-04-18 DIAGNOSIS — F4310 Post-traumatic stress disorder, unspecified: Secondary | ICD-10-CM | POA: Diagnosis not present

## 2018-04-23 ENCOUNTER — Telehealth: Payer: Self-pay | Admitting: Obstetrics & Gynecology

## 2018-04-23 NOTE — Telephone Encounter (Signed)
Patient states she is still bleeding heavily.  Still passing some clots but not as large.  She is currently taking Megace 2 tablets daily and wants to know if she needs to increase to 3 daily or make appt to be seen.  Please advise.

## 2018-04-23 NOTE — Telephone Encounter (Signed)
Patient called stating that she spoke with a nurse last week and they upped her medication to help her stop bleeding but she is still bleeding and would like to know what to do. Please contact pt

## 2018-04-23 NOTE — Telephone Encounter (Signed)
Take 3 per day that's the best I got no need to be seen earlier  [facility] change management

## 2018-04-23 NOTE — Telephone Encounter (Signed)
Patent advised to take 3 per day per Dr Elonda Husky, no need to be seen earlier at this time as it wont change management. Patient verbalized understanding and agreeable to plan.

## 2018-04-25 DIAGNOSIS — R69 Illness, unspecified: Secondary | ICD-10-CM | POA: Diagnosis not present

## 2018-04-25 DIAGNOSIS — F4310 Post-traumatic stress disorder, unspecified: Secondary | ICD-10-CM | POA: Diagnosis not present

## 2018-05-09 DIAGNOSIS — F4310 Post-traumatic stress disorder, unspecified: Secondary | ICD-10-CM | POA: Diagnosis not present

## 2018-05-09 DIAGNOSIS — R69 Illness, unspecified: Secondary | ICD-10-CM | POA: Diagnosis not present

## 2018-05-12 DIAGNOSIS — J449 Chronic obstructive pulmonary disease, unspecified: Secondary | ICD-10-CM | POA: Diagnosis not present

## 2018-05-15 DIAGNOSIS — R69 Illness, unspecified: Secondary | ICD-10-CM | POA: Diagnosis not present

## 2018-05-15 DIAGNOSIS — F411 Generalized anxiety disorder: Secondary | ICD-10-CM | POA: Diagnosis not present

## 2018-05-15 DIAGNOSIS — F902 Attention-deficit hyperactivity disorder, combined type: Secondary | ICD-10-CM | POA: Diagnosis not present

## 2018-05-15 DIAGNOSIS — F4310 Post-traumatic stress disorder, unspecified: Secondary | ICD-10-CM | POA: Diagnosis not present

## 2018-05-15 DIAGNOSIS — F515 Nightmare disorder: Secondary | ICD-10-CM | POA: Diagnosis not present

## 2018-05-23 DIAGNOSIS — F4310 Post-traumatic stress disorder, unspecified: Secondary | ICD-10-CM | POA: Diagnosis not present

## 2018-05-23 DIAGNOSIS — R69 Illness, unspecified: Secondary | ICD-10-CM | POA: Diagnosis not present

## 2018-06-06 DIAGNOSIS — F4310 Post-traumatic stress disorder, unspecified: Secondary | ICD-10-CM | POA: Diagnosis not present

## 2018-06-06 DIAGNOSIS — R69 Illness, unspecified: Secondary | ICD-10-CM | POA: Diagnosis not present

## 2018-06-11 DIAGNOSIS — J449 Chronic obstructive pulmonary disease, unspecified: Secondary | ICD-10-CM | POA: Diagnosis not present

## 2018-06-20 DIAGNOSIS — F4310 Post-traumatic stress disorder, unspecified: Secondary | ICD-10-CM | POA: Diagnosis not present

## 2018-06-20 DIAGNOSIS — R69 Illness, unspecified: Secondary | ICD-10-CM | POA: Diagnosis not present

## 2018-06-25 DIAGNOSIS — F411 Generalized anxiety disorder: Secondary | ICD-10-CM | POA: Diagnosis not present

## 2018-06-25 DIAGNOSIS — R69 Illness, unspecified: Secondary | ICD-10-CM | POA: Diagnosis not present

## 2018-06-25 DIAGNOSIS — F902 Attention-deficit hyperactivity disorder, combined type: Secondary | ICD-10-CM | POA: Diagnosis not present

## 2018-06-25 DIAGNOSIS — F515 Nightmare disorder: Secondary | ICD-10-CM | POA: Diagnosis not present

## 2018-08-10 IMAGING — DX DG CHEST 2V
2 series · 2 of 2 positions shown · non-contrast
Comparison: 05/12/2016

CLINICAL DATA: Chest pain tonight.

EXAM:
CHEST  2 VIEW

[chest pa]
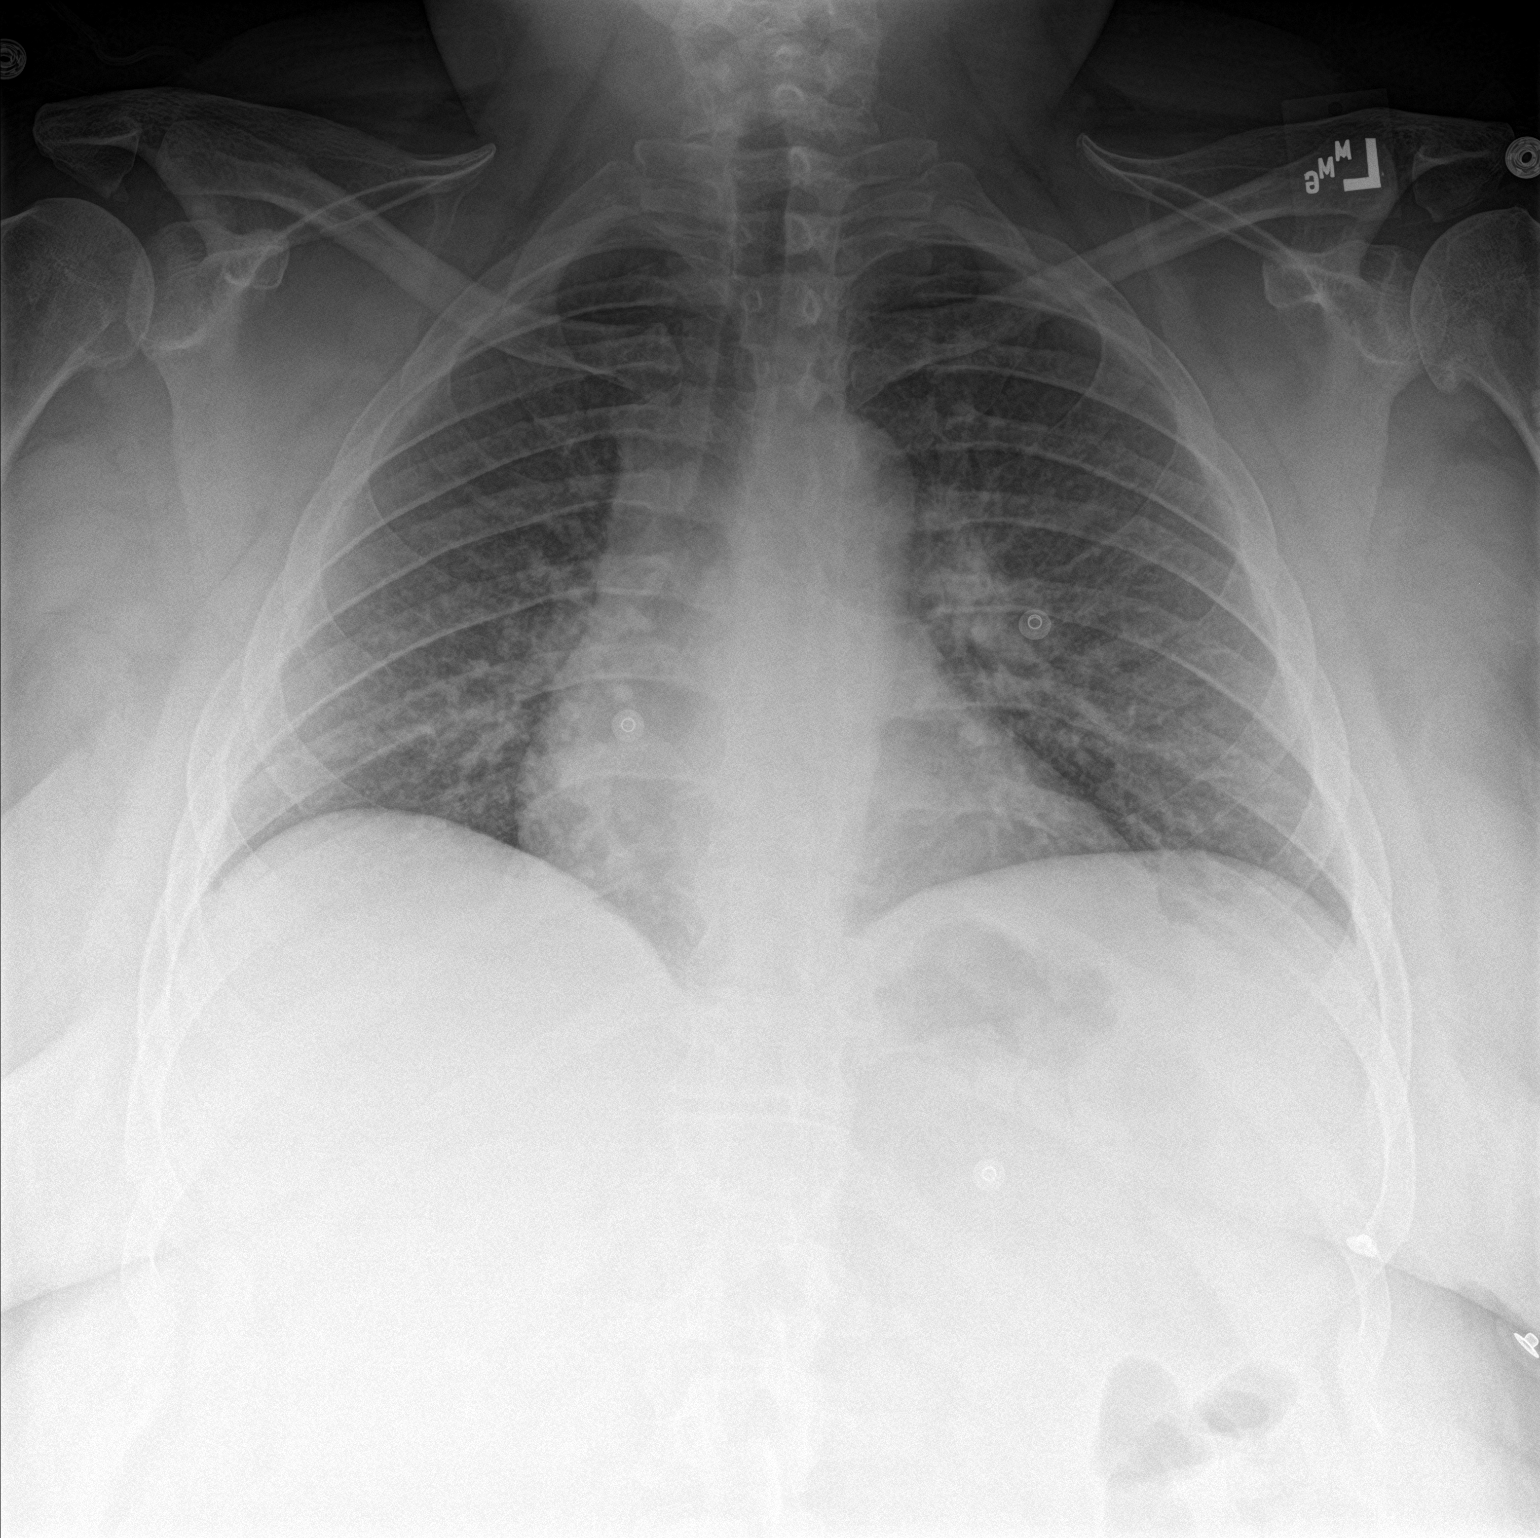

[chest lat]
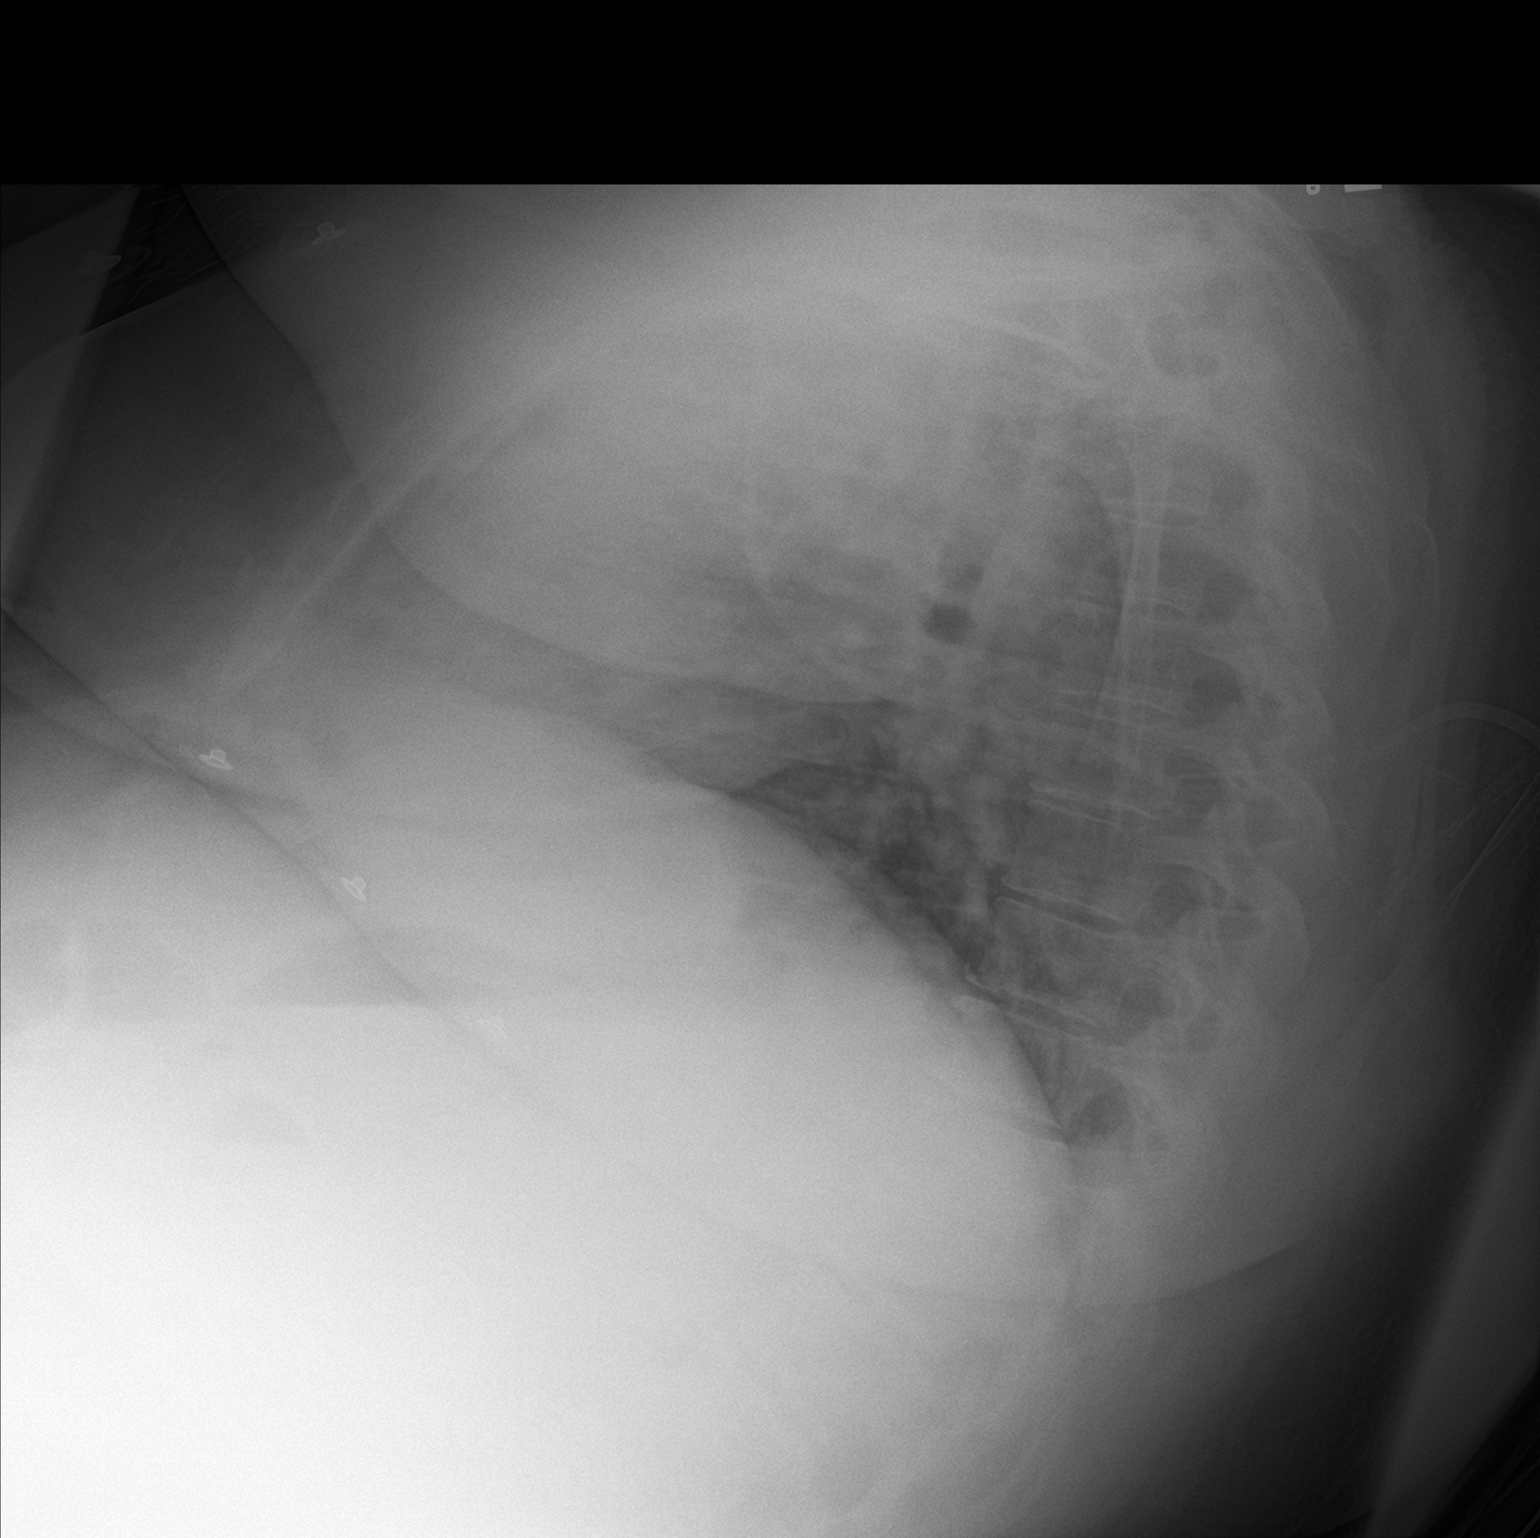

[2 of 2 positions shown; findings below may reference images not displayed]

FINDINGS: Low lung volumes persist. Interstitial prominence is stable. No
focal airspace disease. Normal heart size and mediastinal contours.
No pleural fluid or pneumothorax. Unchanged osseous structures.
IMPRESSION: No acute abnormality.

## 2018-11-05 ENCOUNTER — Other Ambulatory Visit: Payer: Self-pay | Admitting: Nurse Practitioner

## 2018-11-05 MED ORDER — NITROGLYCERIN 0.4 MG SL SUBL: 0.4000 mg | SUBLINGUAL_TABLET | SUBLINGUAL | 0 refills | Status: DC | PRN

## 2018-11-05 NOTE — Telephone Encounter (Signed)
° ° °*  STAT* If patient is at the pharmacy, call can be transferred to refill team.   1. Which medications need to be refilled? (please list name of each medication and dose if known) nitroGLYCERIN (NITROSTAT) 0.4 MG SL tablet, ticagrelor (BRILINTA) 90 MG TABS tablet  2. Which pharmacy/location (including street and city if local pharmacy) is medication to be sent to? [facility], [place] [address]  3. Do they need a 30 day or 90 day supply? [place]

## 2018-11-05 NOTE — Telephone Encounter (Signed)
Pt's medication was sent to pt's pharmacy as requested. Confirmation received.  °

## 2018-12-03 ENCOUNTER — Emergency Department (HOSPITAL_COMMUNITY): Payer: Medicare Other

## 2018-12-03 ENCOUNTER — Other Ambulatory Visit: Payer: Self-pay

## 2018-12-03 ENCOUNTER — Emergency Department (HOSPITAL_COMMUNITY)
Admission: EM | Admit: 2018-12-03 | Discharge: 2018-12-03 | Disposition: A | Discharge status: Home / Self Care | Payer: Medicare Other | Attending: Emergency Medicine | Admitting: Emergency Medicine

## 2018-12-03 DIAGNOSIS — I2510 Atherosclerotic heart disease of native coronary artery without angina pectoris: Secondary | ICD-10-CM | POA: Insufficient documentation

## 2018-12-03 DIAGNOSIS — Z7984 Long term (current) use of oral hypoglycemic drugs: Secondary | ICD-10-CM | POA: Insufficient documentation

## 2018-12-03 DIAGNOSIS — I252 Old myocardial infarction: Secondary | ICD-10-CM | POA: Insufficient documentation

## 2018-12-03 DIAGNOSIS — I110 Hypertensive heart disease with heart failure: Secondary | ICD-10-CM | POA: Insufficient documentation

## 2018-12-03 DIAGNOSIS — Z760 Encounter for issue of repeat prescription: Secondary | ICD-10-CM | POA: Insufficient documentation

## 2018-12-03 DIAGNOSIS — R202 Paresthesia of skin: Secondary | ICD-10-CM | POA: Diagnosis not present

## 2018-12-03 DIAGNOSIS — R457 State of emotional shock and stress, unspecified: Secondary | ICD-10-CM | POA: Insufficient documentation

## 2018-12-03 DIAGNOSIS — Z7982 Long term (current) use of aspirin: Secondary | ICD-10-CM | POA: Insufficient documentation

## 2018-12-03 DIAGNOSIS — F17210 Nicotine dependence, cigarettes, uncomplicated: Secondary | ICD-10-CM | POA: Insufficient documentation

## 2018-12-03 DIAGNOSIS — Z79899 Other long term (current) drug therapy: Secondary | ICD-10-CM | POA: Diagnosis not present

## 2018-12-03 DIAGNOSIS — R079 Chest pain, unspecified: Secondary | ICD-10-CM | POA: Insufficient documentation

## 2018-12-03 DIAGNOSIS — I509 Heart failure, unspecified: Secondary | ICD-10-CM | POA: Diagnosis not present

## 2018-12-03 DIAGNOSIS — R0602 Shortness of breath: Secondary | ICD-10-CM | POA: Diagnosis not present

## 2018-12-03 DIAGNOSIS — E119 Type 2 diabetes mellitus without complications: Secondary | ICD-10-CM | POA: Insufficient documentation

## 2018-12-03 DIAGNOSIS — F1210 Cannabis abuse, uncomplicated: Secondary | ICD-10-CM | POA: Insufficient documentation

## 2018-12-03 DIAGNOSIS — R112 Nausea with vomiting, unspecified: Secondary | ICD-10-CM | POA: Insufficient documentation

## 2018-12-03 DIAGNOSIS — F410 Panic disorder [episodic paroxysmal anxiety] without agoraphobia: Secondary | ICD-10-CM | POA: Diagnosis present

## 2018-12-03 DIAGNOSIS — E039 Hypothyroidism, unspecified: Secondary | ICD-10-CM | POA: Diagnosis not present

## 2018-12-03 DIAGNOSIS — Z955 Presence of coronary angioplasty implant and graft: Secondary | ICD-10-CM | POA: Insufficient documentation

## 2018-12-03 LAB — CBC
HCT: 42.3 % (ref 36.0–46.0)
Hemoglobin: 14.8 g/dL (ref 12.0–15.0)
MCH: 31 pg (ref 26.0–34.0)
MCHC: 35 g/dL (ref 30.0–36.0)
MCV: 88.5 fL (ref 80.0–100.0)
Platelets: 214 10*3/uL (ref 150–400)
RBC: 4.78 MIL/uL (ref 3.87–5.11)
RDW: 11.9 % (ref 11.5–15.5)
WBC: 8.4 10*3/uL (ref 4.0–10.5)
nRBC: 0 % (ref 0.0–0.2)

## 2018-12-03 LAB — TROPONIN I
Troponin I: <0.03 ng/mL (ref <0.03)
Troponin I: <0.03 ng/mL (ref <0.03)

## 2018-12-03 LAB — BASIC METABOLIC PANEL
Anion gap: 14 (ref 5–15)
BUN: 12 mg/dL (ref 6–20)
CO2: 20 mmol/L — ABNORMAL LOW (ref 22–32)
Calcium: 9.1 mg/dL (ref 8.9–10.3)
Chloride: 99 mmol/L (ref 98–111)
Creatinine, Ser: 0.76 mg/dL (ref 0.44–1.00)
GFR calc Af Amer: >60 mL/min (ref >60)
GFR calc non Af Amer: >60 mL/min (ref >60)
Glucose, Bld: 308 mg/dL — ABNORMAL HIGH (ref 70–99)
Potassium: 4.2 mmol/L (ref 3.5–5.1)
Sodium: 133 mmol/L — ABNORMAL LOW (ref 135–145)

## 2018-12-03 LAB — I-STAT BETA HCG BLOOD, ED (MC, WL, AP ONLY): I-stat hCG, quantitative: <5 m[IU]/mL (ref <5)

## 2018-12-03 MED ORDER — ALBUTEROL SULFATE HFA 108 (90 BASE) MCG/ACT IN AERS: 1.0000 | INHALATION_SPRAY | Freq: Four times a day (QID) | RESPIRATORY_TRACT | 3 refills | Status: DC | PRN

## 2018-12-03 MED ORDER — ALBUTEROL SULFATE (2.5 MG/3ML) 0.083% IN NEBU: 2.5000 mg | INHALATION_SOLUTION | Freq: Four times a day (QID) | RESPIRATORY_TRACT | 12 refills | Status: DC | PRN

## 2018-12-03 MED ORDER — CLONIDINE HCL 0.2 MG PO TABS
0.2000 mg | ORAL_TABLET | Freq: Once | ORAL | Status: AC
  Administered 2018-12-03: 0.2 mg via ORAL
  Filled 2018-12-03: qty 1

## 2018-12-03 MED ORDER — METFORMIN HCL 500 MG PO TABS: 500.0000 mg | ORAL_TABLET | Freq: Two times a day (BID) | ORAL | 0 refills | Status: DC

## 2018-12-03 MED ORDER — HYDROXYZINE HCL 25 MG PO TABS: 25.0000 mg | ORAL_TABLET | Freq: Four times a day (QID) | ORAL | 0 refills | Status: DC | PRN

## 2018-12-03 NOTE — ED Notes (Signed)
Patient transported to X-ray 

## 2018-12-03 NOTE — Discharge Instructions (Addendum)
See resource guide for outpatient counseling. Your metformin and albuterol were refilled today.  Use medications as prescribed. New prescription for Vistaril which can be taken as needed for anxiety, this can make you sleepy, do not drive if you take this medication. Follow-up with your primary care as soon as possible, return to ER for new or worsening symptoms.

## 2018-12-03 NOTE — ED Triage Notes (Signed)
Pt began have CP at 0645 with a panic attack. Pain 6/10. EMS gave 324 aspirin and 2X nitro with relief. Pain 0/10 Pain was reported substernal radiating to neck.  CBG 305

## 2018-12-03 NOTE — ED Notes (Signed)
Pt. Is using her cell phone to communicate to her family and friends.  Pt. Requested information about her Troponin level, Stated, I need to hurry up and get out and get to my babies. 2nd Troponin level is still in process.

## 2018-12-03 NOTE — ED Notes (Signed)
Patient tearful at time. States she think she had a panic attack this morning due to a lot of stress.

## 2018-12-03 NOTE — ED Provider Notes (Signed)
[facility] EMERGENCY DEPARTMENT Provider Note   CSN: [id number] Arrival date & time: 12/03/18  0813    History   Chief Complaint No chief complaint on file.   HPI Kaitlyn Chambers is a 38 y.o. female.     38yo female presents with complaint of anxiety attack.  Patient states that she was driving the car with her husband when she found out that he had an affair with her best friend and another woman.  Patient became instantly upset, short of breath with pain across her shoulders and around her chest with tingling in her hands and feet.  Patient pulled the car over became nauseated and had vomiting.  Patient states she had an NSTEMI December 2017 where she had chest pain after getting angry at her son and several hours later developed vomiting, due to vomiting today patient's husband became concerned with her vomiting and called an ambulance.  Patient states that her symptoms will completely resolve when she is able to calm herself down and then if she begins thinking about the details of the event today she becomes upset again however does not have ongoing chest pain.  Patient states she is been unable to take her medications as she had a change in insurance and due to [place] has not been able to go in the office to establish care however does have insurance to get her prescriptions.  No other complaints or concerns.     Past Medical History:  Diagnosis Date  . Anxiety   . Bipolar 1 disorder, manic, full remission ([place])   . CHF (congestive heart failure) ([place])   . COPD (chronic obstructive pulmonary disease) ([place])   . Coronary artery disease   . Depression   . GERD (gastroesophageal reflux disease)   . Gout   . High cholesterol   . Hypertension   . Hypothyroidism   . Metabolic syndrome   . NSTEMI (non-ST elevated myocardial infarction) ([place]) 07/01/2016  . On home oxygen therapy    "available; don't use it" (07/01/2016)  . PCOS (polycystic ovarian syndrome)   .  Stomach ulcer   . Type 2 diabetes, diet controlled [facility])     Patient Active Problem List   Diagnosis Date Noted  . Dyslipidemia 03/15/2017  . Leukocytosis 03/15/2017  . CAD (coronary artery disease) 03/15/2017  . Diabetes mellitus ([place])   . Chest pain 07/01/2016  . Morbid obesity ([place]) 07/01/2016  . PCOS (polycystic ovarian syndrome) 07/01/2016  . Hypertension 07/01/2016  . Bipolar 1 disorder ([place])   . NSTEMI (non-ST elevated myocardial infarction) [facility])     Past Surgical History:  Procedure Laterality Date  . CARDIAC CATHETERIZATION N/A 07/01/2016   Procedure: LEFT HEART CATH AND CORONARY ANGIOGRAPHY;  Surgeon: Burnell Blanks, MD;  Location: [place] CV LAB;  Service: Cardiovascular;  Laterality: N/A;  . CARDIAC CATHETERIZATION N/A 07/01/2016   Procedure: Coronary Stent Intervention;  Surgeon: Burnell Blanks, MD;  Location: [place] CV LAB;  Service: Cardiovascular;  Laterality: N/A;  . CARPAL TUNNEL RELEASE Bilateral   . CERVICAL BIOPSY  W/ LOOP ELECTRODE EXCISION  05/2016   "precancerous cells"  . CORONARY ANGIOPLASTY WITH STENT PLACEMENT  07/01/2016  . TONSILLECTOMY       OB History    Gravida  1   Para      Term  0   Preterm  0   AB  1   Living  0     SAB  1  TAB  0   Ectopic  0   Multiple  0   Live Births               Home Medications    Prior to Admission medications   Medication Sig Start Date End Date Taking? Authorizing Provider  albuterol (PROVENTIL) (2.5 MG/3ML) 0.083% nebulizer solution Take 3 mLs (2.5 mg total) by nebulization every 6 (six) hours as needed for wheezing or shortness of breath. 12/03/18   Tacy Learn, PA-C  albuterol (VENTOLIN HFA) 108 (90 Base) MCG/ACT inhaler Inhale 1-2 puffs into the lungs every 6 (six) hours as needed for wheezing or shortness of breath. 12/03/18   Tacy Learn, PA-C  aspirin EC 81 MG EC tablet Take 1 tablet (81 mg total) by mouth daily. 07/04/16   Hongalgi, Lenis Dickinson, MD   busPIRone (BUSPAR) 10 MG tablet Take 10 mg by mouth daily.  01/31/18   [provider]  clonazePAM (KLONOPIN) 1 MG tablet Take 1 mg by mouth as needed.  01/31/18   [provider]  cloNIDine (CATAPRES) 0.1 MG tablet Take 0.1 mg by mouth 3 (three) times daily.  01/31/18   [provider]  FLUoxetine (PROZAC) 40 MG capsule Take 1 capsule (40 mg total) by mouth at bedtime. Patient taking differently: Take 80 mg by mouth at bedtime.  03/15/17   Geradine Girt, DO  furosemide (LASIX) 40 MG tablet Take 1 tablet (40 mg total) by mouth daily as needed. 03/15/17 06/13/17  Geradine Girt, DO  furosemide (LASIX) 40 MG tablet Take 40 mg by mouth as needed.  01/23/13   [provider]  gabapentin (NEURONTIN) 300 MG capsule Take 300 mg by mouth 2 (two) times daily.    [provider]  hydrOXYzine (ATARAX/VISTARIL) 25 MG tablet Take 1 tablet (25 mg total) by mouth every 6 (six) hours as needed for anxiety. 12/03/18   Tacy Learn, PA-C  lurasidone (LATUDA) 20 MG TABS tablet Take 60 mg by mouth daily.     [provider]  megestrol (MEGACE) 40 MG tablet 3 tablets a day for 5 days, 2 tablets a day for 5 days then 1 tablet daily 01/18/18   Florian Buff, MD  metFORMIN (GLUCOPHAGE) 500 MG tablet Take 1 tablet (500 mg total) by mouth 2 (two) times daily with a meal. 12/03/18   Tacy Learn, PA-C  nitroGLYCERIN (NITROSTAT) 0.4 MG SL tablet Place 1 tablet (0.4 mg total) under the tongue every 5 (five) minutes as needed for chest pain. Please keep upcoming in August. Thanks 11/05/18 02/03/19  Burtis Junes, NP  prazosin (MINIPRESS) 2 MG capsule Take 2 mg by mouth at bedtime. 12/07/17   [provider]  prazosin (MINIPRESS) 5 MG capsule 5 mg at bedtime.  01/03/18   [provider]  temazepam (RESTORIL) 30 MG capsule Take 30 mg by mouth at bedtime.  01/31/18   [provider]  ticagrelor (BRILINTA) 90 MG TABS tablet Take 1 tablet (90 mg total) by mouth  2 (two) times daily. 03/15/17   Geradine Girt, DO  VYVANSE 70 MG capsule Take 70 mg by mouth daily.  01/31/18   [provider]    Family History Family History  Problem Relation Age of Onset  . Diabetes Mother   . Heart failure Mother   . Heart attack Mother 22  . Heart disease Mother   . Healthy Son     Social History Social History  Tobacco Use  . Smoking status: Current Every Day Smoker    Packs/day: 0.50    Years: 26.00    Pack years: 13.00    Types: Cigarettes  . Smokeless tobacco: Never Used  Substance Use Topics  . Alcohol use: No  . Drug use: Yes    Types: Marijuana    Comment: 06/28/2016 "couple times/week; if that"     Allergies   Levaquin [levofloxacin]; Metoprolol; and Tape   Review of Systems Review of Systems  Constitutional: Negative for chills, diaphoresis and fever.  Respiratory: Positive for chest tightness and shortness of breath.   Cardiovascular: Positive for palpitations. Negative for chest pain and leg swelling.  Gastrointestinal: Positive for nausea and vomiting. Negative for abdominal pain.  Skin: Negative for rash and wound.  Allergic/Immunologic: Positive for immunocompromised state.  Psychiatric/Behavioral: The patient is nervous/anxious.   All other systems reviewed and are negative.    Physical Exam Updated Vital Signs BP (!) 123/93 (BP Location: Right Arm)   Pulse 78   Temp 98.7 F (37.1 C) (Oral)   Resp 18   Ht 5\' 7"  (1.702 m)   Wt (!) 145.2 kg   SpO2 100%   BMI 50.12 kg/m   Physical Exam Vitals signs and nursing note reviewed.  Constitutional:      General: She is not in acute distress.    Appearance: She is well-developed. She is not diaphoretic.  HENT:     Head: Normocephalic and atraumatic.  Cardiovascular:     Rate and Rhythm: Normal rate and regular rhythm.     Heart sounds: Normal heart sounds.  Pulmonary:     Effort: Pulmonary effort is normal.     Breath sounds: Normal breath sounds.  Skin:     General: Skin is warm and dry.     Findings: No erythema or rash.  Neurological:     Mental Status: She is alert and oriented to person, place, and time.  Psychiatric:        Mood and Affect: Mood is anxious. Affect is tearful.        Speech: Speech normal.        Thought Content: Thought content is not paranoid. Thought content does not include homicidal or suicidal ideation.      ED Treatments / Results  Labs (all labs ordered are listed, but only abnormal results are displayed) Labs Reviewed  BASIC METABOLIC PANEL - Abnormal; Notable for the following components:      Result Value   Sodium 133 (*)    CO2 20 (*)    Glucose, Bld 308 (*)    All other components within normal limits  CBC  TROPONIN I  TROPONIN I  I-STAT BETA HCG BLOOD, ED ([facility], [facility], [facility] ONLY)    EKG EKG Interpretation  Date/Time:  Monday December 03 2018 08:18:47 EDT Ventricular Rate:  76 PR Interval:    QRS Duration: 106 QT Interval:  390 QTC Calculation: 439 R Axis:   73 Text Interpretation:  Sinus rhythm Ventricular bigeminy Low voltage, precordial leads no change from previous except occasional PVC Confirmed by Charlesetta Shanks [phone]) on 12/03/2018 8:35:01 AM   Radiology Dg Chest 2 View  Result Date: 12/03/2018 CLINICAL DATA:  Chest pain EXAM: CHEST - 2 VIEW COMPARISON:  03/14/2017 FINDINGS: Normal heart size. Lungs are under aerated and grossly clear. No pneumothorax. No pleural effusion. IMPRESSION: No active cardiopulmonary disease. Electronically Signed   By: Marybelle Killings M.D.   On: 12/03/2018 09:07  Procedures Procedures (including critical care time)  Medications Ordered in ED Medications  cloNIDine (CATAPRES) tablet 0.2 mg (0.2 mg Oral Given 12/03/18 1015)     Initial Impression / Assessment and Plan / ED Course  I have reviewed the triage vital signs and the nursing notes.  Pertinent labs & imaging results that were available during my care of the patient were reviewed by me and  considered in my medical decision making (see chart for details).  Clinical Course as of Dec 02 1408  Mon Dec 02, 8437  6771 38 year old female with history of an STEMI in December 2017 presents with complaint of a panic attack.  Patient states that she found out today that her husband had an affair and became extremely upset which caused her to have tightness in her shoulders and chest with tingling in her fingers and toes and vomit.  Patient states her symptoms resolve completely when she is able to calm herself down and return if she starts to get worked up again.  Patient does not feel like she is having a heart attack or symptoms similar to her prior heart attack, she believes her symptoms are related to emotional distress from today's events.  Patient's EKG shows no acute ischemic changes, troponin x2-, CBC and BMP without significant findings, patient's urine pregnancy test is negative.  Patient was given clonidine for her blood pressure.  Her metformin and albuterol was refilled today.  Patient is unclear on her normally prescribed statin and blood pressure medications and will follow-up with her PCP for this.  Return to ER for any worsening or concerning symptoms otherwise given information for outpatient counseling and prescription for Vistaril to take as needed as prescribed for anxiety.   [LM]    Clinical Course User Index [LM] Tacy Learn, PA-C      Final Clinical Impressions(s) / ED Diagnoses   Final diagnoses:  Emotional stress  Medication refill    ED Discharge Orders         Ordered    metFORMIN (GLUCOPHAGE) 500 MG tablet  2 times daily with meals     12/03/18 0924    albuterol (VENTOLIN HFA) 108 (90 Base) MCG/ACT inhaler  Every 6 hours PRN     12/03/18 0924    albuterol (PROVENTIL) (2.5 MG/3ML) 0.083% nebulizer solution  Every 6 hours PRN     12/03/18 0924    hydrOXYzine (ATARAX/VISTARIL) 25 MG tablet  Every 6 hours PRN     12/03/18 1323           Tacy Learn, PA-C 12/03/18 1410    Charlesetta Shanks, MD 12/11/18 2010

## 2018-12-04 IMAGING — DX DG CHEST 2V
2 series · 2 of 2 positions shown · non-contrast
Comparison: 07/01/2016

CLINICAL DATA: Chest pain and bilateral arm pain for 3 day

EXAM:
CHEST  2 VIEW

[w chest pa]
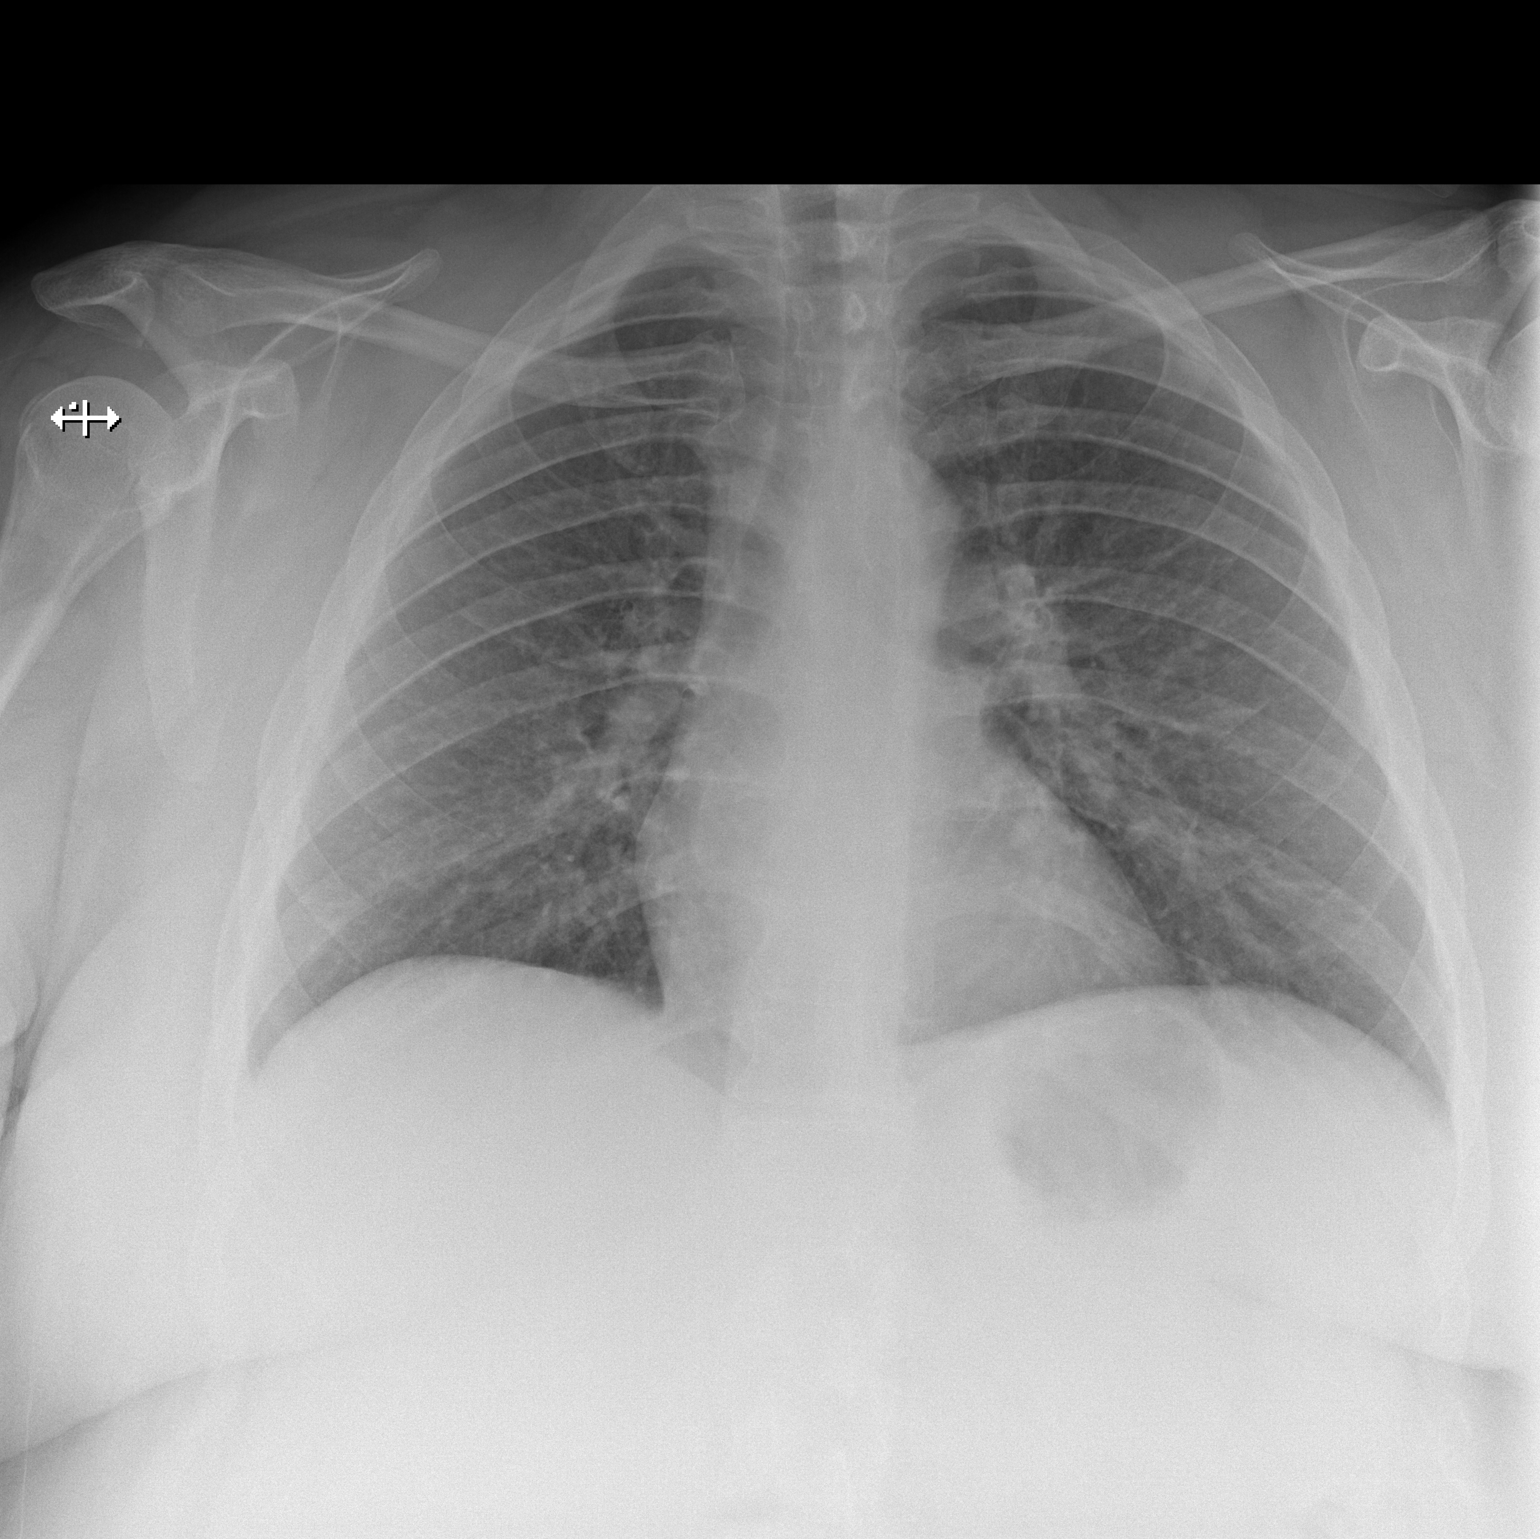

[w chest lat]
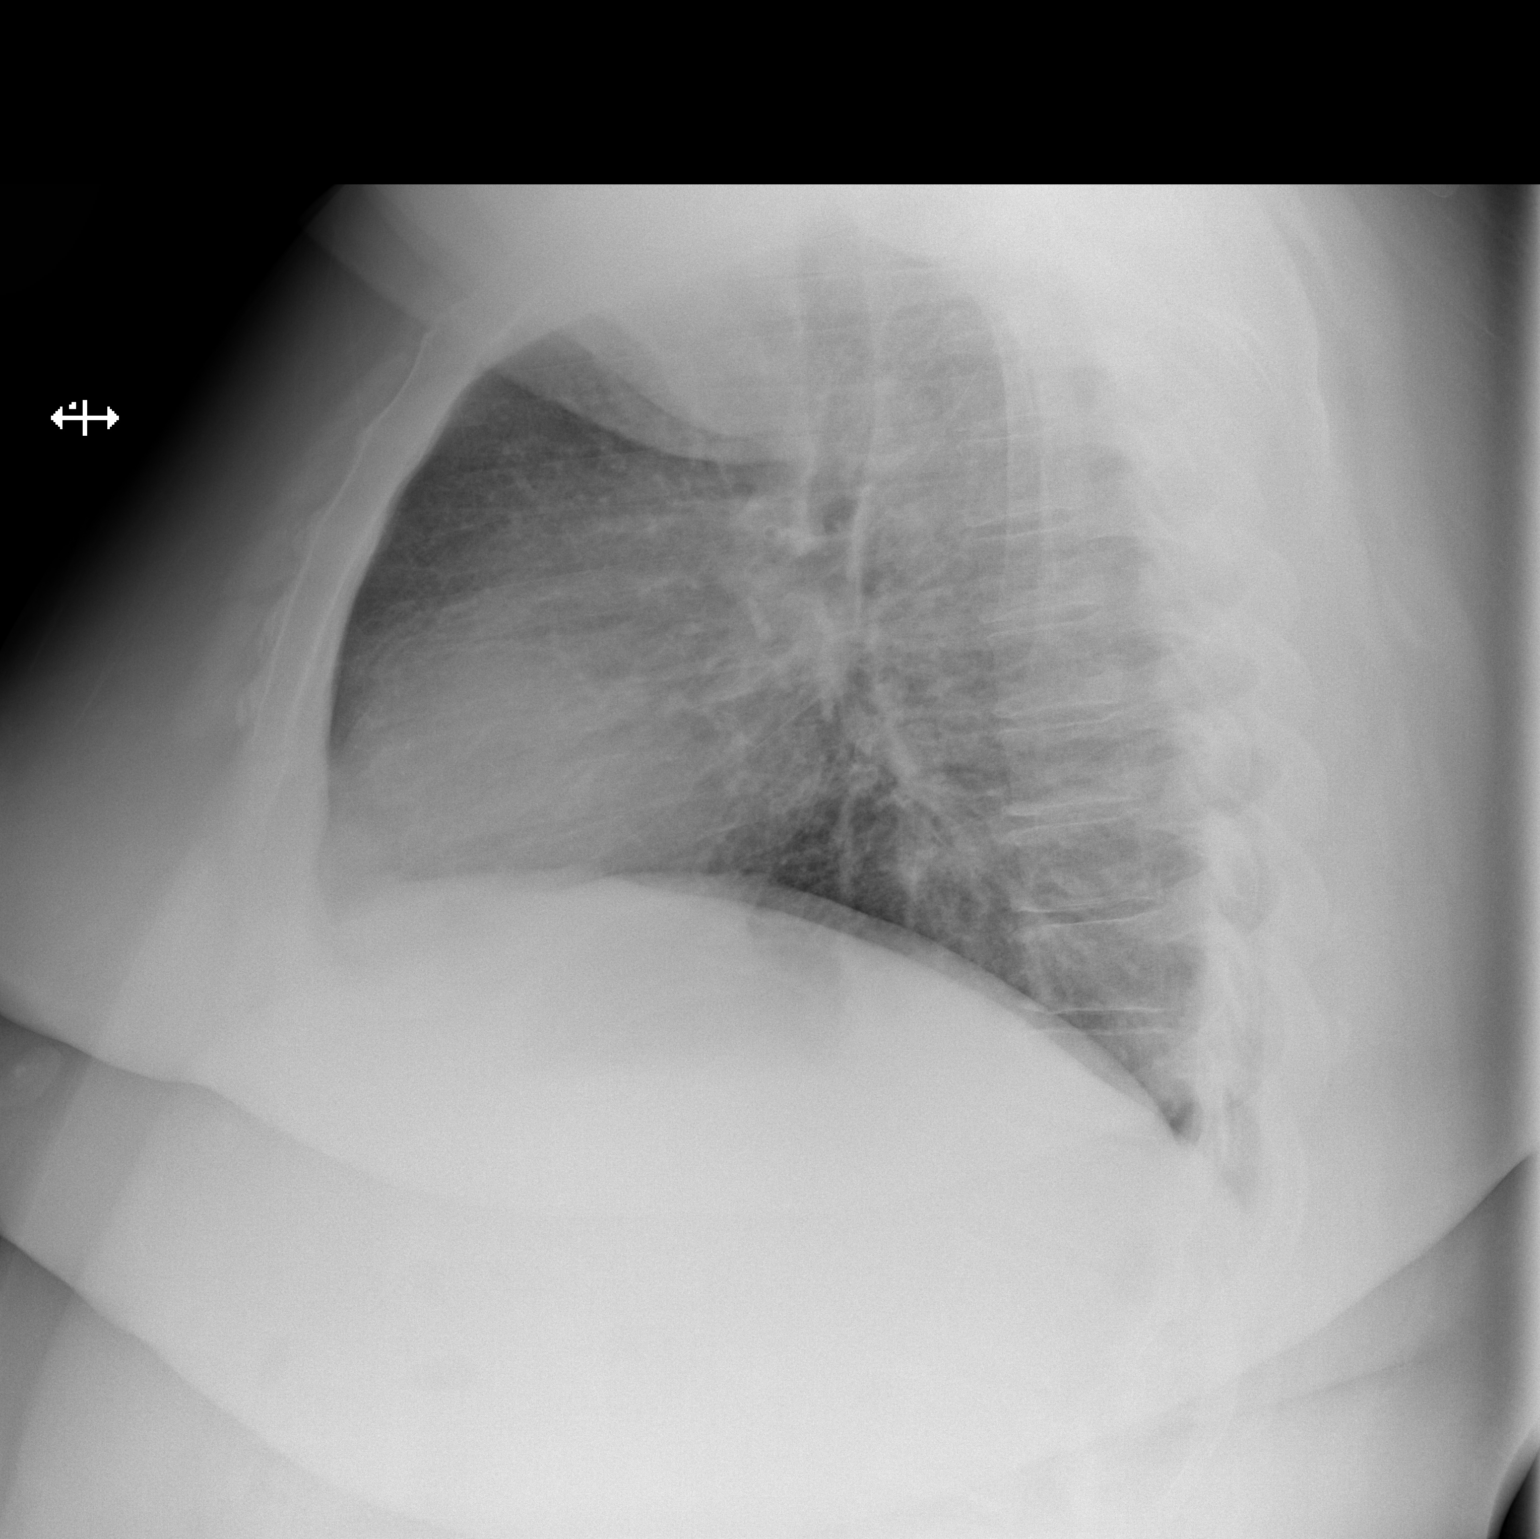

[2 of 2 positions shown; findings below may reference images not displayed]

FINDINGS: Normal heart size. Lungs clear. No pneumothorax. No pleural
effusion.
IMPRESSION: No active cardiopulmonary disease.

## 2019-02-11 NOTE — Progress Notes (Deleted)
CARDIOLOGY OFFICE NOTE  Date:  02/11/2019    Kaitlyn Chambers Date of Birth: 06/10/1981 Medical Record #[id number]  PCP:  Patient, No Pcp Per  Cardiologist:  [facility]  No chief complaint on file.   History of Present Illness: Kaitlyn Chambers is a 38 y.o. female who presents today for a 2 year check.  Seen for Dr. [name].   She has a PMH of morbid obesity, PCOS/metabolic syndrome on metformin (denied history of DM prior to admission), HTN, bipolar disorder, COPD, tobacco &THC abuse.   [facility] with substernal chest pain. EMS was subsequently called and was given sublingual NTG which relieved her pain and she did not want to be transported to the hospital. She then had worsening chest pain that awakened her from sleep and she called 911 again. Family history of MI in mother at age 49 who had stenting. Cardiology assessed her with NSTEMIand she underwent cardiac cath and had DESto circumflex on 07/01/16.  I last saw her back in January of 2018 - some anxiety. Seemed to be on track and motivated to make changes. Last seen in September of 2018 after an ER visit - was out of her medicines - lots of stress - very sad situation.   The patient {does/does not:200015} have symptoms concerning for COVID-19 infection (fever, chills, cough, or new shortness of breath).   Comes in today. Here with   Past Medical History:  Diagnosis Date  . Anxiety   . Bipolar 1 disorder, manic, full remission ([place])   . CHF (congestive heart failure) ([place])   . COPD (chronic obstructive pulmonary disease) ([place])   . Coronary artery disease   . Depression   . GERD (gastroesophageal reflux disease)   . Gout   . High cholesterol   . Hypertension   . Hypothyroidism   . Metabolic syndrome   . NSTEMI (non-ST elevated myocardial infarction) ([place]) 07/01/2016  . On home oxygen therapy    "available; don't use it" (07/01/2016)  . PCOS (polycystic ovarian syndrome)   .  Stomach ulcer   . Type 2 diabetes, diet controlled ([place])     Past Surgical History:  Procedure Laterality Date  . CARDIAC CATHETERIZATION N/A 07/01/2016   Procedure: LEFT HEART CATH AND CORONARY ANGIOGRAPHY;  Surgeon: Burnell Blanks, MD;  Location: [place] CV LAB;  Service: Cardiovascular;  Laterality: N/A;  . CARDIAC CATHETERIZATION N/A 07/01/2016   Procedure: Coronary Stent Intervention;  Surgeon: Burnell Blanks, MD;  Location: [place] CV LAB;  Service: Cardiovascular;  Laterality: N/A;  . CARPAL TUNNEL RELEASE Bilateral   . CERVICAL BIOPSY  W/ LOOP ELECTRODE EXCISION  05/2016   "precancerous cells"  . CORONARY ANGIOPLASTY WITH STENT PLACEMENT  07/01/2016  . TONSILLECTOMY       Medications: No outpatient medications have been marked as taking for the 02/13/19 encounter (Appointment) with Burtis Junes, NP.     Allergies: Allergies  Allergen Reactions  . Levaquin [Levofloxacin] Rash  . Metoprolol Nausea And Vomiting and Other (See Comments)    Sweats and dizziness  . Tape Rash    Social History: The patient  reports that she has been smoking cigarettes. She has a 13.00 pack-year smoking history. She has never used smokeless tobacco. She reports current drug use. Drug: Marijuana. She reports that she does not drink alcohol.   Family History: The patient's ***family history includes Diabetes in her mother; Healthy in her son; Heart attack (  age of onset: 83) in her mother; Heart disease in her mother; Heart failure in her mother.   Review of Systems: Please see the history of present illness.   All other systems are reviewed and negative.   Physical Exam: VS:  There were no vitals taken for this visit. Marland Kitchen  BMI There is no height or weight on file to calculate BMI.  Wt Readings from Last 3 Encounters:  12/03/18 (!) 320 lb (145.2 kg)  02/23/18 (!) 315 lb 8 oz (143.1 kg)  01/18/18 (!) 336 lb (152.4 kg)    General: Pleasant. Well developed, well  nourished and in no acute distress.   HEENT: Normal.  Neck: Supple, no JVD, carotid bruits, or masses noted.  Cardiac: ***Regular rate and rhythm. No murmurs, rubs, or gallops. No edema.  Respiratory:  Lungs are clear to auscultation bilaterally with normal work of breathing.  GI: Soft and nontender.  MS: No deformity or atrophy. Gait and ROM intact.  Skin: Warm and dry. Color is normal.  Neuro:  Strength and sensation are intact and no gross focal deficits noted.  Psych: Alert, appropriate and with normal affect.   LABORATORY DATA:  EKG:  EKG {ACTION; IS/IS BWG:[id number]} ordered today. This demonstrates ***.  Lab Results  Component Value Date   WBC 8.4 12/03/2018   HGB 14.8 12/03/2018   HCT 42.3 12/03/2018   PLT 214 12/03/2018   GLUCOSE 308 (H) 12/03/2018   CHOL 193 03/15/2017   TRIG 405 (H) 03/15/2017   HDL 26 (L) 03/15/2017   LDLCALC UNABLE TO CALCULATE IF TRIGLYCERIDE OVER 400 mg/dL 03/15/2017   ALT 18 12/07/2011   AST 19 12/07/2011   NA 133 (L) 12/03/2018   K 4.2 12/03/2018   CL 99 12/03/2018   CREATININE 0.76 12/03/2018   BUN 12 12/03/2018   CO2 20 (L) 12/03/2018   TSH 7.028 (H) 03/15/2017   INR 1.01 07/01/2016   HGBA1C 6.7 (H) 07/01/2016     BNP (last 3 results) No results for input(s): BNP in the last 8760 hours.  ProBNP (last 3 results) No results for input(s): PROBNP in the last 8760 hours.   Other Studies Reviewed Today:  Cardiac catheterization: 07/01/16      Burnell Blanks, MD 07/01/2016 Routine  Narrative & Impression      The left ventricular ejection fraction is 50-55% by visual estimate.  The left ventricular systolic function is normal.  LV end diastolic pressure is normal.  There is no mitral valve regurgitation.  Mid RCA lesion, 100 %stenosed.  A STENT SYNERGY DES 3X20 drug eluting stent was successfully placed.  Prox Cx to Mid Cx lesion, 100 %stenosed.  Post intervention, there is a 0% residual stenosis.  1.  Acute lateral MI secondary to occluded Circumflex (NSTEMI) 2. Chronic occlusion proximal RCA with filling of distal vessel from left to right collaterals.  3. Preserved LV systolic function 4. Successful PTCA/DES x 1 mid Circumflex.  Recommendations: DAPT for one year with ASA/Brilinta. Continue statin. Beta blocker as tolerated     Assessment/Plan:  1. Recent admission for chest pain with neg evaluation - medicines restarted. Would follow for now.   2. CAD with prior Non-ST elevation myocardial infarction - s/p DES to circumflex from December 2017. No intervention to chronically occluded right coronary with collateral blood flow. Preserved LV function on catheterization. - Continue with dual antiplatelet therapy, aspirin, Brilinta for one year. - She is not onbeta blocker because of hypotension and allergy (she says rash, turned  colors) - Encouraged continued tobacco cessation, marijuana cessation, weight loss, diabetic control, etc. She does seem motivated to make changes.   3. Morbid obesity -she is down a few pounds. Seems motivated.   4. Diabetes  5. Essential hypertension - restarting her ARB today.   6. Substance abuse - not discussed today  7. Psyche disorder/bipolar - seeing her psyche provider this week.   8. HLD  9. Stress - very sad situation.   10 Recent elevation in TSH - she tells she was put on thyroid medicine in the past and that "that was the wrong thing for me". She has been made aware of current TSh and she will discuss with PCP.   73. COVID-19 Education: The signs and symptoms of COVID-19 were discussed with the patient and how to seek care for testing (follow up with PCP or arrange E-visit).  The importance of social distancing, staying at home, hand hygiene and wearing a mask when out in public were discussed today.  Current medicines are reviewed with the patient today.  The patient does not have concerns regarding medicines other  than what has been noted above.  The following changes have been made:  See above.  Labs/ tests ordered today include:   No orders of the defined types were placed in this encounter.    Disposition:   FU with *** in {gen number 7-04:[id number]} {Days to years:10300}.   Patient is agreeable to this plan and will call if any problems develop in the interim.   SignedTruitt Merle, NP  02/11/2019 7:40 AM  [place] [address] [place], [place]  [address] Phone: [phone] Fax: [phone]

## 2019-02-13 ENCOUNTER — Ambulatory Visit: Payer: Medicare HMO | Admitting: Nurse Practitioner

## 2019-02-21 ENCOUNTER — Encounter (HOSPITAL_COMMUNITY): Payer: Self-pay | Admitting: Emergency Medicine

## 2019-02-21 ENCOUNTER — Emergency Department (HOSPITAL_COMMUNITY)
Admission: EM | Admit: 2019-02-21 | Discharge: 2019-02-21 | Disposition: A | Discharge status: Home / Self Care | Payer: Medicare Other | Attending: Emergency Medicine | Admitting: Emergency Medicine

## 2019-02-21 ENCOUNTER — Other Ambulatory Visit: Payer: Self-pay

## 2019-02-21 DIAGNOSIS — I2510 Atherosclerotic heart disease of native coronary artery without angina pectoris: Secondary | ICD-10-CM | POA: Insufficient documentation

## 2019-02-21 DIAGNOSIS — Z7984 Long term (current) use of oral hypoglycemic drugs: Secondary | ICD-10-CM | POA: Diagnosis not present

## 2019-02-21 DIAGNOSIS — I10 Essential (primary) hypertension: Secondary | ICD-10-CM | POA: Insufficient documentation

## 2019-02-21 DIAGNOSIS — E119 Type 2 diabetes mellitus without complications: Secondary | ICD-10-CM | POA: Diagnosis not present

## 2019-02-21 DIAGNOSIS — Z7982 Long term (current) use of aspirin: Secondary | ICD-10-CM | POA: Insufficient documentation

## 2019-02-21 DIAGNOSIS — F319 Bipolar disorder, unspecified: Secondary | ICD-10-CM | POA: Insufficient documentation

## 2019-02-21 DIAGNOSIS — I509 Heart failure, unspecified: Secondary | ICD-10-CM | POA: Insufficient documentation

## 2019-02-21 DIAGNOSIS — J449 Chronic obstructive pulmonary disease, unspecified: Secondary | ICD-10-CM | POA: Diagnosis not present

## 2019-02-21 DIAGNOSIS — E039 Hypothyroidism, unspecified: Secondary | ICD-10-CM | POA: Diagnosis not present

## 2019-02-21 DIAGNOSIS — I252 Old myocardial infarction: Secondary | ICD-10-CM | POA: Diagnosis not present

## 2019-02-21 DIAGNOSIS — M25562 Pain in left knee: Secondary | ICD-10-CM | POA: Diagnosis present

## 2019-02-21 DIAGNOSIS — M109 Gout, unspecified: Secondary | ICD-10-CM | POA: Insufficient documentation

## 2019-02-21 DIAGNOSIS — F1290 Cannabis use, unspecified, uncomplicated: Secondary | ICD-10-CM | POA: Insufficient documentation

## 2019-02-21 DIAGNOSIS — Z79899 Other long term (current) drug therapy: Secondary | ICD-10-CM | POA: Diagnosis not present

## 2019-02-21 DIAGNOSIS — F17210 Nicotine dependence, cigarettes, uncomplicated: Secondary | ICD-10-CM | POA: Insufficient documentation

## 2019-02-21 LAB — CBG MONITORING, ED: Glucose-Capillary: 140 mg/dL — ABNORMAL HIGH (ref 70–99)

## 2019-02-21 MED ORDER — COLCHICINE 0.6 MG PO TABS: 0.6000 mg | ORAL_TABLET | Freq: Once | ORAL | 0 refills | Status: DC

## 2019-02-21 MED ORDER — PREDNISONE 20 MG PO TABS: 40.0000 mg | ORAL_TABLET | Freq: Every day | ORAL | 0 refills | Status: DC

## 2019-02-21 MED ORDER — PREDNISONE 20 MG PO TABS
40.0000 mg | ORAL_TABLET | Freq: Once | ORAL | Status: AC
  Administered 2019-02-21: 40 mg via ORAL
  Filled 2019-02-21: qty 2

## 2019-02-21 MED ORDER — COLCHICINE 0.6 MG PO TABS
1.2000 mg | ORAL_TABLET | Freq: Once | ORAL | Status: AC
  Administered 2019-02-21: 1.2 mg via ORAL
  Filled 2019-02-21: qty 2

## 2019-02-21 NOTE — ED Triage Notes (Signed)
Patient reports history of gout and feels like she's having a flare up in her L knee - pain, swelling, and hot to touch for about a week. States pain gets bad to where she feels nauseated and ambulating has become more difficult, though ambulatory in triage with steady gait.

## 2019-02-21 NOTE — Discharge Instructions (Addendum)
Go directly to pharmacy for prescriptions.  Take colchicine dose 0.6 mg at approximately noon today. Continue your Tylenol and ice regularly. Take prednisone as directed for 5 days. Return if fevers, vomiting, shortness of breath or no improvement in 3 to 4 days.

## 2019-02-21 NOTE — ED Notes (Signed)
ED Provider at bedside. [place]

## 2019-02-21 NOTE — ED Provider Notes (Signed)
[facility] EMERGENCY DEPARTMENT Provider Note   CSN: [id number] Arrival date & time: 02/21/19  9702     History   Chief Complaint Chief Complaint  Patient presents with   Gout    HPI Kaitlyn Chambers is a 38 y.o. female.     Patient with history of bipolar, CHF, COPD, diabetes controlled presents with worsening left knee pain for the past 5 days.  This feels identical to her previous gout flare for which she has had in the great toe and the left knee.  No injuries.  No very fever chills or vomiting.  No shortness of breath or cough.  Patient has been trying naproxen and over-the-counter medicines without improvement.     Past Medical History:  Diagnosis Date   Anxiety    Bipolar 1 disorder, manic, full remission (HCC)    CHF (congestive heart failure) (HCC)    COPD (chronic obstructive pulmonary disease) (HCC)    Coronary artery disease    Depression    GERD (gastroesophageal reflux disease)    Gout    High cholesterol    Hypertension    Hypothyroidism    Metabolic syndrome    NSTEMI (non-ST elevated myocardial infarction) ([place]) 07/01/2016   On home oxygen therapy    "available; don't use it" (07/01/2016)   PCOS (polycystic ovarian syndrome)    Stomach ulcer    Type 2 diabetes, diet controlled ([place])     Patient Active Problem List   Diagnosis Date Noted   Dyslipidemia 03/15/2017   Leukocytosis 03/15/2017   CAD (coronary artery disease) 03/15/2017   Diabetes mellitus ([place])    Chest pain 07/01/2016   Morbid obesity ([place]) 07/01/2016   PCOS (polycystic ovarian syndrome) 07/01/2016   Hypertension 07/01/2016   Bipolar 1 disorder ([place])    NSTEMI (non-ST elevated myocardial infarction) [facility])     Past Surgical History:  Procedure Laterality Date   CARDIAC CATHETERIZATION N/A 07/01/2016   Procedure: LEFT HEART CATH AND CORONARY ANGIOGRAPHY;  Surgeon: Burnell Blanks, MD;  Location: [place] CV LAB;  Service:  Cardiovascular;  Laterality: N/A;   CARDIAC CATHETERIZATION N/A 07/01/2016   Procedure: Coronary Stent Intervention;  Surgeon: Burnell Blanks, MD;  Location: [place] [facility];  Service: Cardiovascular;  Laterality: N/A;   CARPAL TUNNEL RELEASE Bilateral    CERVICAL BIOPSY  W/ LOOP ELECTRODE EXCISION  05/2016   "precancerous cells"   CORONARY ANGIOPLASTY WITH STENT PLACEMENT  07/01/2016   TONSILLECTOMY       OB History    Gravida  1   Para      Term  0   Preterm  0   AB  1   Living  0     SAB  1   TAB  0   Ectopic  0   Multiple  0   Live Births               Home Medications    Prior to Admission medications   Medication Sig Start Date End Date Taking? Authorizing Provider  albuterol (PROVENTIL) (2.5 MG/3ML) 0.083% nebulizer solution Take 3 mLs (2.5 mg total) by nebulization every 6 (six) hours as needed for wheezing or shortness of breath. 12/03/18   Tacy Learn, PA-C  albuterol (VENTOLIN HFA) 108 (90 Base) MCG/ACT inhaler Inhale 1-2 puffs into the lungs every 6 (six) hours as needed for wheezing or shortness of breath. 12/03/18   Tacy Learn, PA-C  aspirin EC 81 MG  EC tablet Take 1 tablet (81 mg total) by mouth daily. 07/04/16   Hongalgi, Lenis Dickinson, MD  busPIRone (BUSPAR) 10 MG tablet Take 10 mg by mouth daily.  01/31/18   [provider]  clonazePAM (KLONOPIN) 1 MG tablet Take 1 mg by mouth as needed.  01/31/18   [provider]  cloNIDine (CATAPRES) 0.1 MG tablet Take 0.1 mg by mouth 3 (three) times daily.  01/31/18   [provider]  colchicine 0.6 MG tablet Take 1 tablet (0.6 mg total) by mouth once for 1 dose. 02/21/19 02/21/19  Elnora Morrison, MD  FLUoxetine (PROZAC) 40 MG capsule Take 1 capsule (40 mg total) by mouth at bedtime. Patient taking differently: Take 80 mg by mouth at bedtime.  03/15/17   Geradine Girt, DO  furosemide (LASIX) 40 MG tablet Take 1 tablet (40 mg total) by mouth daily as needed. 03/15/17 06/13/17   Geradine Girt, DO  furosemide (LASIX) 40 MG tablet Take 40 mg by mouth as needed.  01/23/13   [provider]  gabapentin (NEURONTIN) 300 MG capsule Take 300 mg by mouth 2 (two) times daily.    [provider]  hydrOXYzine (ATARAX/VISTARIL) 25 MG tablet Take 1 tablet (25 mg total) by mouth every 6 (six) hours as needed for anxiety. 12/03/18   Tacy Learn, PA-C  lurasidone (LATUDA) 20 MG TABS tablet Take 60 mg by mouth daily.     [provider]  megestrol (MEGACE) 40 MG tablet 3 tablets a day for 5 days, 2 tablets a day for 5 days then 1 tablet daily 01/18/18   Florian Buff, MD  metFORMIN (GLUCOPHAGE) 500 MG tablet Take 1 tablet (500 mg total) by mouth 2 (two) times daily with a meal. 12/03/18   Tacy Learn, PA-C  nitroGLYCERIN (NITROSTAT) 0.4 MG SL tablet Place 1 tablet (0.4 mg total) under the tongue every 5 (five) minutes as needed for chest pain. Please keep upcoming in August. Thanks 11/05/18 02/03/19  Burtis Junes, NP  prazosin (MINIPRESS) 2 MG capsule Take 2 mg by mouth at bedtime. 12/07/17   [provider]  prazosin (MINIPRESS) 5 MG capsule 5 mg at bedtime.  01/03/18   [provider]  predniSONE (DELTASONE) 20 MG tablet Take 2 tablets (40 mg total) by mouth daily with breakfast. 02/22/19   Elnora Morrison, MD  temazepam (RESTORIL) 30 MG capsule Take 30 mg by mouth at bedtime.  01/31/18   [provider]  ticagrelor (BRILINTA) 90 MG TABS tablet Take 1 tablet (90 mg total) by mouth 2 (two) times daily. 03/15/17   Geradine Girt, DO  VYVANSE 70 MG capsule Take 70 mg by mouth daily.  01/31/18   [provider]    Family History Family History  Problem Relation Age of Onset   Diabetes Mother    Heart failure Mother    Heart attack Mother 35   Heart disease Mother    Healthy Son     Social History Social History   Tobacco Use   Smoking status: Current Every Day Smoker    Packs/day: 0.50    Years: 26.00    Pack  years: 13.00    Types: Cigarettes   Smokeless tobacco: Never Used  Substance Use Topics   Alcohol use: No   Drug use: Yes    Types: Marijuana    Comment: 06/28/2016 "couple times/week; if that"     Allergies   Levaquin [levofloxacin], Metoprolol, and Tape  Review of Systems Review of Systems  Constitutional: Negative for chills and fever.  Respiratory: Negative for shortness of breath.   Cardiovascular: Negative for chest pain.  Gastrointestinal: Negative for abdominal pain and vomiting.  Genitourinary: Negative for dysuria and flank pain.  Musculoskeletal: Positive for joint swelling. Negative for back pain, neck pain and neck stiffness.  Skin: Negative for rash.  Neurological: Negative for light-headedness and headaches.     Physical Exam Updated Vital Signs BP 133/87 (BP Location: Right Arm)    Pulse 95    Temp 97.8 F (36.6 C)    Resp 18    SpO2 99%   Physical Exam Vitals signs and nursing note reviewed.  Constitutional:      Appearance: She is well-developed.  HENT:     Head: Normocephalic and atraumatic.  Eyes:     General:        Right eye: No discharge.        Left eye: No discharge.     Conjunctiva/sclera: Conjunctivae normal.  Neck:     Trachea: No tracheal deviation.  Cardiovascular:     Rate and Rhythm: Normal rate.  Pulmonary:     Effort: Pulmonary effort is normal.  Abdominal:     General: There is no distension.     Palpations: Abdomen is soft.     Tenderness: There is no abdominal tenderness. There is no guarding.  Musculoskeletal:        General: Swelling and tenderness present. No signs of injury.     Comments: Patient has mild edema and moderate tenderness to left knee.  Mild effusion appreciated, pain with flexion.  No external sign of infection.  No swelling to the left lower extremity.  No tenderness or effusion to other lower extremity joints.  Skin:    General: Skin is warm.     Findings: No rash.  Neurological:     Mental  Status: She is alert and oriented to person, place, and time.      ED Treatments / Results  Labs (all labs ordered are listed, but only abnormal results are displayed) Labs Reviewed  CBG MONITORING, ED - Abnormal; Notable for the following components:      Result Value   Glucose-Capillary 140 (*)    All other components within normal limits    EKG None  Radiology No results found.  Procedures Procedures (including critical care time)  Medications Ordered in ED Medications  predniSONE (DELTASONE) tablet 40 mg (has no administration in time range)  colchicine tablet 1.2 mg (has no administration in time range)     Initial Impression / Assessment and Plan / ED Course  I have reviewed the triage vital signs and the nursing notes.  Pertinent labs & imaging results that were available during my care of the patient were reviewed by me and considered in my medical decision making (see chart for details).       Patient presents with clinical concern for gout flare. Patient reported that she did well with prednisone and colchicine in the past.  First dose ordered in the ER.  No concern for septic joint this time however discussed reasons to return and close outpatient follow-up. Point-of-care glucose 140, discussed prednisone will increase her sugar levels and to monitor. Final Clinical Impressions(s) / ED Diagnoses   Final diagnoses:  Acute gout of left knee, unspecified cause    ED Discharge Orders         Ordered    predniSONE (DELTASONE) 20 MG  tablet  Daily with breakfast     02/21/19 1020    colchicine 0.6 MG tablet   Once     02/21/19 1020           Elnora Morrison, MD 02/21/19 1023

## 2019-03-13 ENCOUNTER — Ambulatory Visit (INDEPENDENT_AMBULATORY_CARE_PROVIDER_SITE_OTHER)
Admission: RE | Admit: 2019-03-13 | Discharge: 2019-03-13 | Disposition: A | Discharge status: Home / Self Care | Payer: Medicare Other | Source: Ambulatory Visit

## 2019-03-13 DIAGNOSIS — R3915 Urgency of urination: Secondary | ICD-10-CM | POA: Diagnosis not present

## 2019-03-13 DIAGNOSIS — R350 Frequency of micturition: Secondary | ICD-10-CM

## 2019-03-13 DIAGNOSIS — R300 Dysuria: Secondary | ICD-10-CM | POA: Diagnosis not present

## 2019-03-13 MED ORDER — PHENAZOPYRIDINE HCL 200 MG PO TABS: 200.0000 mg | ORAL_TABLET | Freq: Three times a day (TID) | ORAL | 0 refills | Status: DC

## 2019-03-13 MED ORDER — NITROFURANTOIN MONOHYD MACRO 100 MG PO CAPS: 100.0000 mg | ORAL_CAPSULE | Freq: Two times a day (BID) | ORAL | 0 refills | Status: AC

## 2019-03-13 NOTE — ED Provider Notes (Signed)
[place]   Virtual Visit via Video Note:  Kaitlyn Chambers  initiated request for Telemedicine visit with [facility] Urgent Care team. I connected with Kaitlyn Chambers  on 03/13/2019 at 10:42 AM  for a synchronized telemedicine visit using a video enabled HIPPA compliant telemedicine application. I verified that I am speaking with Kaitlyn Chambers  using two identifiers. [place], PA-C  was physically located in a [facility] Urgent care site and Kaitlyn Chambers was located at a different location.   The limitations of evaluation and management by telemedicine as well as the availability of in-person appointments were discussed. Patient was informed that she  may incur a bill ( including co-pay) for this virtual visit encounter. Kaitlyn Chambers  expressed understanding and gave verbal consent to proceed with virtual visit.   RS:[id number] 03/13/19 Arrival Time: 1033  CC: Burning with urination  SUBJECTIVE:  Kaitlyn Chambers is a 38 y.o. female who complains of urinary frequency, urgency and dysuria for the past 3 days.  Admits to cleaning with caress body soap, and sexual activity prior to symptoms.  Complains of abdominal pressure prior to urination.  Has tried OTC AZO without relief.  Symptoms are made worse with urination.  Admits to similar symptoms in the past.  Denies fever, chills, nausea, vomiting, abdominal pain, flank pain, abnormal vaginal discharge, vaginal odor, vaginal burning, vaginal bleeding, hematuria.    LMP: No LMP recorded. (Menstrual status: Other).  ROS: As in HPI.  All other pertinent ROS negative.     Past Medical History:  Diagnosis Date  . Anxiety   . Bipolar 1 disorder, manic, full remission ([place])   . CHF (congestive heart failure) ([place])   . COPD (chronic obstructive pulmonary disease) ([place])   . Coronary artery disease   . Depression   . GERD (gastroesophageal reflux disease)   . Gout   . High cholesterol   . Hypertension   . Hypothyroidism   .  Metabolic syndrome   . NSTEMI (non-ST elevated myocardial infarction) ([place]) 07/01/2016  . On home oxygen therapy    "available; don't use it" (07/01/2016)  . PCOS (polycystic ovarian syndrome)   . Stomach ulcer   . Type 2 diabetes, diet controlled ([place])    Past Surgical History:  Procedure Laterality Date  . CARDIAC CATHETERIZATION N/A 07/01/2016   Procedure: LEFT HEART CATH AND CORONARY ANGIOGRAPHY;  Surgeon: Burnell Blanks, MD;  Location: [place] [facility];  Service: Cardiovascular;  Laterality: N/A;  . CARDIAC CATHETERIZATION N/A 07/01/2016   Procedure: Coronary Stent Intervention;  Surgeon: Burnell Blanks, MD;  Location: [place] [facility];  Service: Cardiovascular;  Laterality: N/A;  . CARPAL TUNNEL RELEASE Bilateral   . CERVICAL BIOPSY  W/ LOOP ELECTRODE EXCISION  05/2016   "precancerous cells"  . CORONARY ANGIOPLASTY WITH STENT PLACEMENT  07/01/2016  . TONSILLECTOMY     Allergies  Allergen Reactions  . Levaquin [Levofloxacin] Rash  . Metoprolol Nausea And Vomiting and Other (See Comments)    Sweats and dizziness  . Tape Rash   No current facility-administered medications on file prior to encounter.    Current Outpatient Medications on File Prior to Encounter  Medication Sig Dispense Refill  . albuterol (PROVENTIL) (2.5 MG/3ML) 0.083% nebulizer solution Take 3 mLs (2.5 mg total) by nebulization every 6 (six) hours as needed for wheezing or shortness of breath. 75 mL 12  . albuterol (VENTOLIN HFA) 108 (90 Base) MCG/ACT inhaler Inhale 1-2 puffs  into the lungs every 6 (six) hours as needed for wheezing or shortness of breath. 1 Inhaler 3  . aspirin EC 81 MG EC tablet Take 1 tablet (81 mg total) by mouth daily. 30 tablet 0  . busPIRone (BUSPAR) 10 MG tablet Take 10 mg by mouth daily.     . cloNIDine (CATAPRES) 0.1 MG tablet Take 0.1 mg by mouth 3 (three) times daily.     . colchicine 0.6 MG tablet Take 1 tablet (0.6 mg total) by mouth once for 1 dose. 1 tablet 0   . FLUoxetine (PROZAC) 40 MG capsule Take 1 capsule (40 mg total) by mouth at bedtime. (Patient taking differently: Take 80 mg by mouth at bedtime. ) 30 capsule 0  . furosemide (LASIX) 40 MG tablet Take 1 tablet (40 mg total) by mouth daily as needed. 15 tablet 0  . furosemide (LASIX) 40 MG tablet Take 40 mg by mouth as needed.     . gabapentin (NEURONTIN) 300 MG capsule Take 300 mg by mouth 2 (two) times daily.    . hydrOXYzine (ATARAX/VISTARIL) 25 MG tablet Take 1 tablet (25 mg total) by mouth every 6 (six) hours as needed for anxiety. 12 tablet 0  . lurasidone (LATUDA) 20 MG TABS tablet Take 60 mg by mouth daily.     . megestrol (MEGACE) 40 MG tablet 3 tablets a day for 5 days, 2 tablets a day for 5 days then 1 tablet daily 45 tablet 3  . metFORMIN (GLUCOPHAGE) 500 MG tablet Take 1 tablet (500 mg total) by mouth 2 (two) times daily with a meal. 60 tablet 0  . nitroGLYCERIN (NITROSTAT) 0.4 MG SL tablet Place 1 tablet (0.4 mg total) under the tongue every 5 (five) minutes as needed for chest pain. Please keep upcoming in August. Thanks 75 tablet 0  . prazosin (MINIPRESS) 2 MG capsule Take 2 mg by mouth at bedtime.  0  . prazosin (MINIPRESS) 5 MG capsule 5 mg at bedtime.   0  . temazepam (RESTORIL) 30 MG capsule Take 30 mg by mouth at bedtime.     . ticagrelor (BRILINTA) 90 MG TABS tablet Take 1 tablet (90 mg total) by mouth 2 (two) times daily. 60 tablet 0  . [DISCONTINUED] VYVANSE 70 MG capsule Take 70 mg by mouth daily.       OBJECTIVE:  There were no vitals filed for this visit.  General appearance: alert; no distress Eyes: EOMI grossly HENT: normocephalic; atraumatic Neck: supple with FROM Lungs: normal respiratory effort; speaking in full sentences without difficulty Extremities: moves extremities without difficulty Skin: No obvious rashes Neurologic: No facial asymmetries Psychological: alert and cooperative; normal mood and affect   ASSESSMENT & PLAN:  1. Dysuria   2.  Urinary urgency   3. Urinary frequency     Meds ordered this encounter  Medications  . nitrofurantoin, macrocrystal-monohydrate, (MACROBID) 100 MG capsule    Sig: Take 1 capsule (100 mg total) by mouth 2 (two) times daily for 5 days.    Dispense:  10 capsule    Refill:  0    Order Specific Question:   Supervising Provider    Answer:   Raylene Everts [id number]  . phenazopyridine (PYRIDIUM) 200 MG tablet    Sig: Take 1 tablet (200 mg total) by mouth 3 (three) times daily.    Dispense:  6 tablet    Refill:  0    Order Specific Question:   Supervising Provider    Answer:  Raylene Everts [id number]    Symptoms sound consistent with UTI Push fluids and get plenty of rest.   Take antibiotic as directed and to completion Take pyridium as prescribed and as needed for symptomatic relief Follow up in person or with PCP if symptoms persists Follow up in [place] or go to ER if you have any new or worsening symptoms such as fever, worsening abdominal pain, nausea/vomiting, flank pain, etc...   I discussed the assessment and treatment plan with the patient. The patient was provided an opportunity to ask questions and all were answered. The patient agreed with the plan and demonstrated an understanding of the instructions.   The patient was advised to call back or seek an in-person evaluation if the symptoms worsen or if the condition fails to improve as anticipated.  I provided 6 minutes of non-face-to-face time during this encounter.  Kaitlyn Box, PA-C  03/13/2019 10:42 AM      Kaitlyn Box, PA-C 03/13/19 1042

## 2019-03-13 NOTE — Discharge Instructions (Signed)
Symptoms sound consistent with UTI Push fluids and get plenty of rest.   Take antibiotic as directed and to completion Take pyridium as prescribed and as needed for symptomatic relief Follow up in person or with PCP if symptoms persists Follow up in [place] or go to ER if you have any new or worsening symptoms such as fever, worsening abdominal pain, nausea/vomiting, flank pain, etc..Marland Kitchen

## 2019-04-30 ENCOUNTER — Other Ambulatory Visit: Payer: Self-pay

## 2019-04-30 ENCOUNTER — Encounter (HOSPITAL_COMMUNITY): Payer: Self-pay

## 2019-04-30 ENCOUNTER — Ambulatory Visit (HOSPITAL_COMMUNITY)
Admission: EM | Admit: 2019-04-30 | Discharge: 2019-04-30 | Disposition: A | Discharge status: Home / Self Care | Payer: Medicare Other | Attending: Family Medicine | Admitting: Family Medicine

## 2019-04-30 DIAGNOSIS — Z3202 Encounter for pregnancy test, result negative: Secondary | ICD-10-CM

## 2019-04-30 DIAGNOSIS — Z202 Contact with and (suspected) exposure to infections with a predominantly sexual mode of transmission: Secondary | ICD-10-CM | POA: Insufficient documentation

## 2019-04-30 LAB — POCT URINALYSIS DIP (DEVICE)
Bilirubin Urine: NEGATIVE
Glucose, UA: 500 mg/dL — AB
Hgb urine dipstick: NEGATIVE
Nitrite: NEGATIVE
Protein, ur: NEGATIVE mg/dL
Specific Gravity, Urine: 1.025 (ref 1.005–1.030)
Urobilinogen, UA: 1 mg/dL (ref 0.0–1.0)
pH: 7 (ref 5.0–8.0)

## 2019-04-30 LAB — HIV ANTIBODY (ROUTINE TESTING W REFLEX): HIV Screen 4th Generation wRfx: NONREACTIVE

## 2019-04-30 LAB — POCT PREGNANCY, URINE: Preg Test, Ur: NEGATIVE

## 2019-04-30 MED ORDER — AZITHROMYCIN 250 MG PO TABS
1000.0000 mg | ORAL_TABLET | Freq: Once | ORAL | Status: AC
  Administered 2019-04-30: 1000 mg via ORAL

## 2019-04-30 MED ORDER — CEFTRIAXONE SODIUM 250 MG IJ SOLR
INTRAMUSCULAR | Status: AC
  Filled 2019-04-30: qty 250

## 2019-04-30 MED ORDER — CEFTRIAXONE SODIUM 250 MG IJ SOLR
250.0000 mg | Freq: Once | INTRAMUSCULAR | Status: AC
  Administered 2019-04-30: 250 mg via INTRAMUSCULAR

## 2019-04-30 MED ORDER — AZITHROMYCIN 250 MG PO TABS
ORAL_TABLET | ORAL | Status: AC
  Filled 2019-04-30: qty 4

## 2019-04-30 NOTE — ED Provider Notes (Signed)
[place]    CSN: [id number] Arrival date & time: 04/30/19  1534      History   Chief Complaint Chief Complaint  Patient presents with  . Exposure to STD  . Vaginal Discharge  . Abdominal Pain    HPI Kaitlyn Chambers is a 38 y.o. female.   HPI  Patient states that she got a call from her boyfriend that she needs to go for STD testing.  He notified her that he was positive for gonorrhea and chlamydia.  She is having some vaginal discharge.  No abdominal pain.  No fever or chills.  No rash.  No lesions.  Past Medical History:  Diagnosis Date  . Anxiety   . Bipolar 1 disorder, manic, full remission ([place])   . CHF (congestive heart failure) ([place])   . COPD (chronic obstructive pulmonary disease) ([place])   . Coronary artery disease   . Depression   . GERD (gastroesophageal reflux disease)   . Gout   . High cholesterol   . Hypertension   . Hypothyroidism   . Metabolic syndrome   . NSTEMI (non-ST elevated myocardial infarction) ([place]) 07/01/2016  . On home oxygen therapy    "available; don't use it" (07/01/2016)  . PCOS (polycystic ovarian syndrome)   . Stomach ulcer   . Type 2 diabetes, diet controlled [facility])     Patient Active Problem List   Diagnosis Date Noted  . Dyslipidemia 03/15/2017  . Leukocytosis 03/15/2017  . CAD (coronary artery disease) 03/15/2017  . Diabetes mellitus ([place])   . Chest pain 07/01/2016  . Morbid obesity ([place]) 07/01/2016  . PCOS (polycystic ovarian syndrome) 07/01/2016  . Hypertension 07/01/2016  . Bipolar 1 disorder ([place])   . NSTEMI (non-ST elevated myocardial infarction) [facility])     Past Surgical History:  Procedure Laterality Date  . CARDIAC CATHETERIZATION N/A 07/01/2016   Procedure: LEFT HEART CATH AND CORONARY ANGIOGRAPHY;  Surgeon: Burnell Blanks, MD;  Location: [place] [facility];  Service: Cardiovascular;  Laterality: N/A;  . CARDIAC CATHETERIZATION N/A 07/01/2016   Procedure: Coronary Stent Intervention;   Surgeon: Burnell Blanks, MD;  Location: [place] [facility];  Service: Cardiovascular;  Laterality: N/A;  . CARPAL TUNNEL RELEASE Bilateral   . CERVICAL BIOPSY  W/ LOOP ELECTRODE EXCISION  05/2016   "precancerous cells"  . CORONARY ANGIOPLASTY WITH STENT PLACEMENT  07/01/2016  . TONSILLECTOMY      OB History    Gravida  1   Para      Term  0   Preterm  0   AB  1   Living  0     SAB  1   TAB  0   Ectopic  0   Multiple  0   Live Births               Home Medications    Prior to Admission medications   Medication Sig Start Date End Date Taking? Authorizing Provider  albuterol (PROVENTIL) (2.5 MG/3ML) 0.083% nebulizer solution Take 3 mLs (2.5 mg total) by nebulization every 6 (six) hours as needed for wheezing or shortness of breath. 12/03/18   Tacy Learn, PA-C  albuterol (VENTOLIN HFA) 108 (90 Base) MCG/ACT inhaler Inhale 1-2 puffs into the lungs every 6 (six) hours as needed for wheezing or shortness of breath. 12/03/18   Tacy Learn, PA-C  aspirin EC 81 MG EC tablet Take 1 tablet (81 mg total) by mouth daily. 07/04/16   [place],  Lenis Dickinson, MD  FLUoxetine (PROZAC) 40 MG capsule Take 1 capsule (40 mg total) by mouth at bedtime. Patient taking differently: Take 80 mg by mouth at bedtime.  03/15/17   Geradine Girt, DO  furosemide (LASIX) 40 MG tablet Take 40 mg by mouth as needed.  01/23/13   [provider]  lurasidone (LATUDA) 20 MG TABS tablet Take 60 mg by mouth daily.     [provider]  metFORMIN (GLUCOPHAGE) 500 MG tablet Take 1 tablet (500 mg total) by mouth 2 (two) times daily with a meal. 12/03/18   Suella Broad A, PA-C  prazosin (MINIPRESS) 2 MG capsule Take 2 mg by mouth at bedtime. 12/07/17   [provider]  prazosin (MINIPRESS) 5 MG capsule 5 mg at bedtime.  01/03/18   [provider]  ticagrelor (BRILINTA) 90 MG TABS tablet Take 1 tablet (90 mg total) by mouth 2 (two) times daily. 03/15/17   Geradine Girt, DO   cloNIDine (CATAPRES) 0.1 MG tablet Take 0.1 mg by mouth 3 (three) times daily.  01/31/18 04/30/19  [provider]  colchicine 0.6 MG tablet Take 1 tablet (0.6 mg total) by mouth once for 1 dose. 02/21/19 04/30/19  Elnora Morrison, MD  gabapentin (NEURONTIN) 300 MG capsule Take 300 mg by mouth 2 (two) times daily.  04/30/19  [provider]  nitroGLYCERIN (NITROSTAT) 0.4 MG SL tablet Place 1 tablet (0.4 mg total) under the tongue every 5 (five) minutes as needed for chest pain. Please keep upcoming in August. Thanks 11/05/18 04/30/19  Burtis Junes, NP  temazepam (RESTORIL) 30 MG capsule Take 30 mg by mouth at bedtime.  01/31/18 04/30/19  [provider]  VYVANSE 70 MG capsule Take 70 mg by mouth daily.  01/31/18 03/13/19  [provider]    Family History Family History  Problem Relation Age of Onset  . Diabetes Mother   . Heart failure Mother   . Heart attack Mother 49  . Heart disease Mother   . Healthy Son     Social History Social History   Tobacco Use  . Smoking status: Current Every Day Smoker    Packs/day: 0.50    Years: 26.00    Pack years: 13.00    Types: Cigarettes  . Smokeless tobacco: Never Used  Substance Use Topics  . Alcohol use: No  . Drug use: Yes    Types: Marijuana    Comment: 06/28/2016 "couple times/week; if that"     Allergies   Levaquin [levofloxacin], Metoprolol, and Tape   Review of Systems Review of Systems  Constitutional: Negative for chills and fever.  HENT: Negative for ear pain and sore throat.   Eyes: Negative for pain and visual disturbance.  Respiratory: Negative for cough and shortness of breath.   Cardiovascular: Negative for chest pain and palpitations.  Gastrointestinal: Negative for abdominal pain and vomiting.  Genitourinary: Positive for vaginal discharge. Negative for dysuria, hematuria, menstrual problem, pelvic pain and vaginal bleeding.  Musculoskeletal: Negative for arthralgias and back  pain.  Skin: Negative for color change and rash.  Neurological: Negative for seizures and syncope.  All other systems reviewed and are negative.    Physical Exam Triage Vital Signs ED Triage Vitals  Enc Vitals Group     BP 04/30/19 1622 132/75     Pulse Rate 04/30/19 1622 93     Resp 04/30/19 1622 18     Temp 04/30/19 1622 97.7 F (36.5 C)  Temp Source 04/30/19 1622 Temporal     SpO2 04/30/19 1622 98 %     Weight --      Height --      Head Circumference --      Peak Flow --      Pain Score 04/30/19 1619 8     Pain Loc --      Pain Edu? --      Excl. in [place]? --    No data found.  Updated Vital Signs BP 132/75 (BP Location: Right Arm)   Pulse 93   Temp 97.7 F (36.5 C) (Temporal)   Resp 18   LMP  (Within Months) Comment: Last period pt remember was 5 months ago aprox  SpO2 98%      Physical Exam Constitutional:      General: She is not in acute distress.    Appearance: She is well-developed. She is obese.  HENT:     Head: Normocephalic and atraumatic.  Eyes:     Conjunctiva/sclera: Conjunctivae normal.     Pupils: Pupils are equal, round, and reactive to light.  Neck:     Musculoskeletal: Normal range of motion.  Cardiovascular:     Rate and Rhythm: Normal rate.  Pulmonary:     Effort: Pulmonary effort is normal. No respiratory distress.  Abdominal:     General: Abdomen is protuberant. There is no distension.     Palpations: Abdomen is soft.     Tenderness: There is no abdominal tenderness.  Genitourinary:    Comments: Exam deferred Musculoskeletal: Normal range of motion.  Skin:    General: Skin is warm and dry.  Neurological:     Mental Status: She is alert.  Psychiatric:        Mood and Affect: Mood normal.        Behavior: Behavior normal.      UC Treatments / Results  Labs (all labs ordered are listed, but only abnormal results are displayed) Labs Reviewed  POCT URINALYSIS DIP (DEVICE) - Abnormal; Notable for the following components:       Result Value   Glucose, UA 500 (*)    Ketones, ur TRACE (*)    Leukocytes,Ua TRACE (*)    All other components within normal limits  HIV ANTIBODY (ROUTINE TESTING W REFLEX)  RPR  POCT PREGNANCY, URINE  CERVICOVAGINAL ANCILLARY ONLY    EKG   Radiology No results found.  Procedures Procedures (including critical care time)  Medications Ordered in UC Medications  cefTRIAXone (ROCEPHIN) injection 250 mg (250 mg Intramuscular Given 04/30/19 1721)  azithromycin (ZITHROMAX) tablet 1,000 mg (1,000 mg Oral Given 04/30/19 1721)  azithromycin (ZITHROMAX) 250 MG tablet (has no administration in time range)  cefTRIAXone (ROCEPHIN) 250 MG injection (has no administration in time range)    Initial Impression / Assessment and Plan / UC Course  I have reviewed the triage vital signs and the nursing notes.  Pertinent labs & imaging results that were available during my care of the patient were reviewed by me and considered in my medical decision making (see chart for details).     I recommend patient be tested for GC chlamydia, trichomonas, RPR, and HIV.  We will notify her if any tests are positive Final Clinical Impressions(s) / UC Diagnoses   Final diagnoses:  Exposure to STD     Discharge Instructions     We will call you if any of your tests return positive You have been treated for both gonorrhea and  chlamydia No sexual contact for 7 days Call for problems   ED Prescriptions    None     PDMP not reviewed this encounter.   Raylene Everts, MD 04/30/19 [phone]

## 2019-04-30 NOTE — ED Triage Notes (Signed)
Pt reports her partner told her today she needs to be checked for STD, as he tested positive for STD. Pt states she is having lower abdominal pain x 2-3 weeks, if she pass gas, she feel relieve. Pt is having beige vaginal discharge x 1 week.

## 2019-04-30 NOTE — Discharge Instructions (Addendum)
We will call you if any of your tests return positive You have been treated for both gonorrhea and chlamydia No sexual contact for 7 days Call for problems

## 2019-05-01 LAB — RPR: RPR Ser Ql: NONREACTIVE

## 2019-05-02 LAB — CERVICOVAGINAL ANCILLARY ONLY
Chlamydia: NEGATIVE
Neisseria Gonorrhea: NEGATIVE
Trichomonas: NEGATIVE

## 2019-05-14 ENCOUNTER — Telehealth: Payer: Medicare Other | Admitting: Physician Assistant

## 2019-05-14 DIAGNOSIS — R05 Cough: Secondary | ICD-10-CM

## 2019-05-14 DIAGNOSIS — J309 Allergic rhinitis, unspecified: Secondary | ICD-10-CM | POA: Diagnosis not present

## 2019-05-14 DIAGNOSIS — R059 Cough, unspecified: Secondary | ICD-10-CM

## 2019-05-14 MED ORDER — FLUTICASONE PROPIONATE 50 MCG/ACT NA SUSP: 2.0000 | Freq: Every day | NASAL | 6 refills | Status: DC

## 2019-05-14 NOTE — Progress Notes (Signed)
E visit for Allergic Rhinitis We are sorry that you are not feeling well.  Here is how we plan to help!  Based on what you have shared with me it looks like you have Allergic Rhinitis.  Rhinitis is when a reaction occurs that causes nasal congestion, runny nose, sneezing, and itching.  Most types of rhinitis are caused by an inflammation and are associated with symptoms in the eyes ears or throat. There are several types of rhinitis.  The most common are acute rhinitis, which is usually caused by a viral illness, allergic or seasonal rhinitis, and nonallergic or year-round rhinitis.  Nasal allergies occur certain times of the year.  Allergic rhinitis is caused when allergens in the air trigger the release of histamine in the body.  Histamine causes itching, swelling, and fluid to build up in the fragile linings of the nasal passages, sinuses and eyelids.  An itchy nose and clear discharge are common.  Be sure to use your Albuterol as needed for wheezing.  If you cannot get this under control, please make an appointment with your PCP.   I recommend the following over the counter treatments: Xyzal 5 mg take 1 tablet daily  I also would recommend a nasal spray: Flonase 2 sprays into each nostril once daily and Saline 1 spray into each nostril as needed  You may also benefit from eye drops such as: Systane 1-2 driops each eye twice daily as needed  HOME CARE:   You can use an over-the-counter saline nasal spray as needed  Avoid areas where there is heavy dust, mites, or molds  Stay indoors on windy days during the pollen season  Keep windows closed in home, at least in bedroom; use air conditioner.  Use high-efficiency house air filter  Keep windows closed in car, turn AC on re-circulate  Avoid playing out with dog during pollen season  GET HELP RIGHT AWAY IF:   If your symptoms do not improve within 10 days  You become short of breath  You develop yellow or green discharge from  your nose for over 3 days  You have coughing fits  MAKE SURE YOU:   Understand these instructions  Will watch your condition  Will get help right away if you are not doing well or get worse  Thank you for choosing an e-visit. Your e-visit answers were reviewed by a board certified advanced clinical practitioner to complete your personal care plan. Depending upon the condition, your plan could have included both over the counter or prescription medications. Please review your pharmacy choice. Be sure that the pharmacy you have chosen is open so that you can pick up your prescription now.  If there is a problem you may message your provider in [place] to have the prescription routed to another pharmacy. Your safety is important to [place]. If you have drug allergies check your prescription carefully.  For the next 24 hours, you can use MyChart to ask questions about today's visit, request a non-urgent call back, or ask for a work or school excuse from your e-visit provider. You will get an email in the next two days asking about your experience. I hope that your e-visit has been valuable and will speed your recovery.   Greater than 5 minutes, yet less than 10 minutes of time have been spent researching, coordinating and implementing care for this patient today.

## 2019-06-19 ENCOUNTER — Telehealth: Payer: Self-pay | Admitting: Cardiology

## 2019-06-19 NOTE — Telephone Encounter (Signed)
[id number] 12/16.  Tentatively scheduled appt for 12/21 with Dr [name]

## 2019-06-19 NOTE — Telephone Encounter (Signed)
I spoke to the patient who has been experiencing SOB and swelling.  She is taking Lasix 40 mg as needed, but this not seem to help.  She seems to have "pitting" edema as described by the patient in her legs.  I scheduled an appointment with Dr [name] on 12/21 @ 9:00.  She verbalized understanding.

## 2019-06-19 NOTE — Telephone Encounter (Signed)
New Message   Michae Kava is calling from [facility] care and says she had a virtual visit with the pt and wanted to let cardiology know some symptoms the pt is having  Pt is having swelling, that is not going away with her Medication. She is taking Lasix  She is experiencing some SOB and does have COPD  Pt says her BP 150/90  Michae Kava is requesting a nurse to call the  Pt    Please call

## 2019-06-24 ENCOUNTER — Ambulatory Visit: Payer: Medicare Other | Admitting: Cardiology

## 2019-07-19 ENCOUNTER — Ambulatory Visit (INDEPENDENT_AMBULATORY_CARE_PROVIDER_SITE_OTHER): Payer: Medicare Other | Admitting: Cardiology

## 2019-07-19 ENCOUNTER — Other Ambulatory Visit: Payer: Self-pay

## 2019-07-19 ENCOUNTER — Encounter: Payer: Self-pay | Admitting: Cardiology

## 2019-07-19 VITALS — BP 136/96 | HR 101 | Ht 67.0 in | Wt 320.0 lb

## 2019-07-19 DIAGNOSIS — I2510 Atherosclerotic heart disease of native coronary artery without angina pectoris: Secondary | ICD-10-CM | POA: Diagnosis not present

## 2019-07-19 DIAGNOSIS — I2583 Coronary atherosclerosis due to lipid rich plaque: Secondary | ICD-10-CM

## 2019-07-19 DIAGNOSIS — I10 Essential (primary) hypertension: Secondary | ICD-10-CM

## 2019-07-19 DIAGNOSIS — E785 Hyperlipidemia, unspecified: Secondary | ICD-10-CM | POA: Diagnosis not present

## 2019-07-19 DIAGNOSIS — Z79899 Other long term (current) drug therapy: Secondary | ICD-10-CM | POA: Diagnosis not present

## 2019-07-19 LAB — COMPREHENSIVE METABOLIC PANEL
ALT: 20 IU/L (ref 0–32)
AST: 17 IU/L (ref 0–40)
Albumin/Globulin Ratio: 1.4 (ref 1.2–2.2)
Albumin: 4 g/dL (ref 3.8–4.8)
Alkaline Phosphatase: 81 IU/L (ref 39–117)
BUN/Creatinine Ratio: 11 (ref 9–23)
BUN: 9 mg/dL (ref 6–24)
Bilirubin Total: 0.2 mg/dL (ref 0.0–1.2)
CO2: 22 mmol/L (ref 20–29)
Calcium: 9.2 mg/dL (ref 8.7–10.2)
Chloride: 99 mmol/L (ref 96–106)
Creatinine, Ser: 0.79 mg/dL (ref 0.57–1.00)
GFR calc Af Amer: 108 mL/min/{1.73_m2} (ref >59)
GFR calc non Af Amer: 94 mL/min/{1.73_m2} (ref >59)
Globulin, Total: 2.8 g/dL (ref 1.5–4.5)
Glucose: 261 mg/dL — ABNORMAL HIGH (ref 65–99)
Potassium: 3.8 mmol/L (ref 3.5–5.2)
Sodium: 134 mmol/L (ref 134–144)
Total Protein: 6.8 g/dL (ref 6.0–8.5)

## 2019-07-19 LAB — CBC
Hematocrit: 41.9 % (ref 34.0–46.6)
Hemoglobin: 14.6 g/dL (ref 11.1–15.9)
MCH: 31.1 pg (ref 26.6–33.0)
MCHC: 34.8 g/dL (ref 31.5–35.7)
MCV: 89 fL (ref 79–97)
Platelets: 249 10*3/uL (ref 150–450)
RBC: 4.7 x10E6/uL (ref 3.77–5.28)
RDW: 12.1 % (ref 11.7–15.4)
WBC: 10 10*3/uL (ref 3.4–10.8)

## 2019-07-19 MED ORDER — IRBESARTAN 75 MG PO TABS: 75.0000 mg | ORAL_TABLET | Freq: Every day | ORAL | 3 refills | Status: DC

## 2019-07-19 MED ORDER — ROSUVASTATIN CALCIUM 20 MG PO TABS: 20.0000 mg | ORAL_TABLET | Freq: Every day | ORAL | 3 refills | Status: DC

## 2019-07-19 MED ORDER — FENOFIBRATE 145 MG PO TABS: 145.0000 mg | ORAL_TABLET | Freq: Every day | ORAL | 3 refills | Status: DC

## 2019-07-19 NOTE — Progress Notes (Signed)
Cardiology Office Note:    Date:  07/19/2019   ID:  Kaitlyn Chambers, DOB [birth year], MRN [id number]  PCP:  Patient, No Pcp Per  Cardiologist:  No primary care provider on file.  Electrophysiologist:  None   Referring MD: No ref. provider found     History of Present Illness:    Kaitlyn Chambers is a 39 y.o. female here for follow-up of CAD prior substernal chest pain, morbid obesity metabolic syndrome hypertension bipolar disorder COPD tobacco use.  DES to circumflex 07/01/2016.  Mother had heart attack at age 32.  Has dealt with anxiety in the past.  In September 2018, had an admission, negative troponin overall reassuring.  Had increased stressors in her life, high risk social determinants of health.  Been having more panic attacks, mimicking some heart symptoms.  Eating better, trying to lose weight.  Triglycerides.  Hard to find PCP.  More edema in feet, swelling, bruising. Support hose.   Past Medical History:  Diagnosis Date  . Anxiety   . Bipolar 1 disorder, manic, full remission ([place])   . CHF (congestive heart failure) ([place])   . COPD (chronic obstructive pulmonary disease) ([place])   . Coronary artery disease   . Depression   . GERD (gastroesophageal reflux disease)   . Gout   . High cholesterol   . Hypertension   . Hypothyroidism   . Metabolic syndrome   . NSTEMI (non-ST elevated myocardial infarction) ([place]) 07/01/2016  . On home oxygen therapy    "available; don't use it" (07/01/2016)  . PCOS (polycystic ovarian syndrome)   . Stomach ulcer   . Type 2 diabetes, diet controlled ([place])     Past Surgical History:  Procedure Laterality Date  . CARDIAC CATHETERIZATION N/A 07/01/2016   Procedure: LEFT HEART CATH AND CORONARY ANGIOGRAPHY;  Surgeon: Burnell Blanks, MD;  Location: [facility];  Service: Cardiovascular;  Laterality: N/A;  . CARDIAC CATHETERIZATION N/A 07/01/2016   Procedure: Coronary Stent Intervention;  Surgeon: Burnell Blanks, MD;   Location: [facility];  Service: Cardiovascular;  Laterality: N/A;  . CARPAL TUNNEL RELEASE Bilateral   . CERVICAL BIOPSY  W/ LOOP ELECTRODE EXCISION  05/2016   "precancerous cells"  . CORONARY ANGIOPLASTY WITH STENT PLACEMENT  07/01/2016  . TONSILLECTOMY      Current Medications: Current Meds  Medication Sig  . albuterol (PROVENTIL) (2.5 MG/3ML) 0.083% nebulizer solution Take 3 mLs (2.5 mg total) by nebulization every 6 (six) hours as needed for wheezing or shortness of breath.  Marland Kitchen albuterol (VENTOLIN HFA) 108 (90 Base) MCG/ACT inhaler Inhale 1-2 puffs into the lungs every 6 (six) hours as needed for wheezing or shortness of breath.  Marland Kitchen aspirin EC 81 MG EC tablet Take 1 tablet (81 mg total) by mouth daily.  Marland Kitchen FLUoxetine (PROZAC) 40 MG capsule Take 1 capsule (40 mg total) by mouth at bedtime. (Patient taking differently: Take 80 mg by mouth at bedtime. )  . fluticasone (FLONASE) 50 MCG/ACT nasal spray Place 2 sprays into both nostrils daily.  . furosemide (LASIX) 40 MG tablet Take 40 mg by mouth as needed.   . lurasidone (LATUDA) 20 MG TABS tablet Take 60 mg by mouth daily.   . metFORMIN (GLUCOPHAGE) 500 MG tablet Take 1 tablet (500 mg total) by mouth 2 (two) times daily with a meal.  . prazosin (MINIPRESS) 2 MG capsule Take 2 mg by mouth at bedtime.  . prazosin (MINIPRESS) 5 MG capsule 5 mg at bedtime.  Allergies:   Levaquin [levofloxacin], Metoprolol, and Tape   Social History   Socioeconomic History  . Marital status: Divorced    Spouse name: Not on file  . Number of children: Not on file  . Years of education: Not on file  . Highest education level: Not on file  Occupational History  . Not on file  Tobacco Use  . Smoking status: Current Every Day Smoker    Packs/day: 0.50    Years: 26.00    Pack years: 13.00    Types: Cigarettes  . Smokeless tobacco: Never Used  Substance and Sexual Activity  . Alcohol use: No  . Drug use: Yes    Types: Marijuana    Comment:  06/28/2016 "couple times/week; if that"  . Sexual activity: Yes    Birth control/protection: None  Other Topics Concern  . Not on file  Social History Narrative  . Not on file   Social Determinants of Health   Financial Resource Strain:   . Difficulty of Paying Living Expenses: Not on file  Food Insecurity:   . Worried About Charity fundraiser in the Last Year: Not on file  . Ran Out of Food in the Last Year: Not on file  Transportation Needs:   . Lack of Transportation (Medical): Not on file  . Lack of Transportation (Non-Medical): Not on file  Physical Activity:   . Days of Exercise per Week: Not on file  . Minutes of Exercise per Session: Not on file  Stress:   . Feeling of Stress : Not on file  Social Connections:   . Frequency of Communication with Friends and Family: Not on file  . Frequency of Social Gatherings with Friends and Family: Not on file  . Attends Religious Services: Not on file  . Active Member of Clubs or Organizations: Not on file  . Attends Archivist Meetings: Not on file  . Marital Status: Not on file     Family History: The patient's family history includes Diabetes in her mother; Healthy in her son; Heart attack (age of onset: 35) in her mother; Heart disease in her mother; Heart failure in her mother.  ROS:   Please see the history of present illness.    Denies any fevers chills nausea vomiting syncope bleeding all other systems reviewed and are negative.  EKGs/Labs/Other Studies Reviewed:    The following studies were reviewed today: Cardiac catheterization 07/01/2016: 1. Acute lateral MI secondary to occluded Circumflex (NSTEMI) 2. Chronic occlusion proximal RCA with filling of distal vessel from left to right collaterals.  3. Preserved LV systolic function 4. Successful PTCA/DES x 1 mid Circumflex.  EKG:  EKG is  ordered today.  The ekg ordered today demonstrates sinus tachycardia 101 with no other EKG changes.  Recent  Labs: 12/03/2018: BUN 12; Creatinine, Ser 0.76; Hemoglobin 14.8; Platelets 214; Potassium 4.2; Sodium 133  Recent Lipid Panel    Component Value Date/Time   CHOL 193 03/15/2017 0700   TRIG 405 (H) 03/15/2017 0700   HDL 26 (L) 03/15/2017 0700   CHOLHDL 7.4 03/15/2017 0700   VLDL UNABLE TO CALCULATE IF TRIGLYCERIDE OVER 400 mg/dL 03/15/2017 0700   LDLCALC UNABLE TO CALCULATE IF TRIGLYCERIDE OVER 400 mg/dL 03/15/2017 0700    Physical Exam:    VS:  BP (!) 136/96   Pulse (!) 101   Ht _0  (1.702 m)   Wt (!) 320 lb (145.2 kg)   SpO2 96%   BMI 50.12 kg/m  Wt Readings from Last 3 Encounters:  07/19/19 (!) 320 lb (145.2 kg)  12/03/18 (!) 320 lb (145.2 kg)  02/23/18 (!) 315 lb 8 oz (143.1 kg)     GEN:  Well nourished, well developed in no acute distress, obese HEENT: Normal NECK: No JVD; No carotid bruits LYMPHATICS: No lymphadenopathy CARDIAC: RRR, no murmurs, rubs, gallops RESPIRATORY:  Clear to auscultation without rales, wheezing or rhonchi  ABDOMEN: Soft, non-tender, non-distended MUSCULOSKELETAL:  No edema; No deformity  SKIN: Warm and dry NEUROLOGIC:  Alert and oriented x 3 PSYCHIATRIC:  Normal affect   ASSESSMENT:    1. Coronary artery disease due to lipid rich plaque   2. Essential hypertension   3. Dyslipidemia   4. Medication management   5. Morbid obesity ([place])    PLAN:    In order of problems listed above:  Coronary artery disease -Prior non-STEMI December 2017 with DES to circumflex.  Right coronary artery was occluded.  Collateral blood flow noted. -She has not been on her medications.  She is willing to restart. -We will give her irbesartan 75 mg once a day -She is not taking a beta-blocker, stated that she had an allergy to this before, turned colors -I would like to at next visit we discussed this starting Toprol.  She may be able to tolerate and this will be helpful given her underlying CAD.  Hyperlipidemia -Mixed with high triglycerides.  We  will start her on Crestor 20 mg high intensity.  We will also place her back on fibrate 145  Morbid obesity -Continue to encourage weight loss.  Challenging social determinants of health, child is being transported to [place] until he is 39 years old.  Today we will check a CBC, complete metabolic profile. We will have her come back in for lipid panel and ALT in 3 months.   Medication Adjustments/Labs and Tests Ordered: Current medicines are reviewed at length with the patient today.  Concerns regarding medicines are outlined above.  Orders Placed This Encounter  Procedures  . CBC  . Comp Met (CMET)  . ALT  . Lipid Profile  . EKG 12-Lead cs   Meds ordered this encounter  Medications  . rosuvastatin (CRESTOR) 20 MG tablet    Sig: Take 1 tablet (20 mg total) by mouth daily.    Dispense:  90 tablet    Refill:  3  . fenofibrate (TRICOR) 145 MG tablet    Sig: Take 1 tablet (145 mg total) by mouth daily.    Dispense:  90 tablet    Refill:  3  . irbesartan (AVAPRO) 75 MG tablet    Sig: Take 1 tablet (75 mg total) by mouth daily.    Dispense:  90 tablet    Refill:  3    Patient Instructions  Medication Instructions:  Please start Crestor 20 mg today, Fenofibrate 145 mg a day and Irbesartan 75 mg daily. Continue all other medications as listed.  *If you need a refill on your cardiac medications before your next appointment, please call your pharmacy*  Lab Work: Please have blood work today (CBC, CMP) Have fastin lab work in 3 months (Lipid/ALT)  If you have labs (blood work) drawn today and your tests are completely normal, you will receive your results only by: Marland Kitchen MyChart Message (if you have MyChart) OR . A paper copy in the mail If you have any lab test that is abnormal or we need to change your treatment, we will call you to review  the results.  Follow-Up: At [facility], you and your health needs are our priority.  As part of our continuing mission to provide you  with exceptional heart care, we have created designated Provider Care Teams.  These Care Teams include your primary Cardiologist (physician) and Advanced Practice Providers (APPs -  Physician Assistants and Nurse Practitioners) who all work together to provide you with the care you need, when you need it.  Your next appointment:   3 month(s)  The format for your next appointment:   In Person  Provider:   Truitt Merle, NP  Thank you for choosing [facility]!!    Needs a list of primary care doctors    Signed, Candee Furbish, MD  07/19/2019 11:27 AM    [place]

## 2019-07-19 NOTE — Patient Instructions (Signed)
Medication Instructions:  Please start Crestor 20 mg today, Fenofibrate 145 mg a day and Irbesartan 75 mg daily. Continue all other medications as listed.  *If you need a refill on your cardiac medications before your next appointment, please call your pharmacy*  Lab Work: Please have blood work today (CBC, CMP) Have fastin lab work in 3 months (Lipid/ALT)  If you have labs (blood work) drawn today and your tests are completely normal, you will receive your results only by: Marland Kitchen MyChart Message (if you have MyChart) OR . A paper copy in the mail If you have any lab test that is abnormal or we need to change your treatment, we will call you to review the results.  Follow-Up: At [facility], you and your health needs are our priority.  As part of our continuing mission to provide you with exceptional heart care, we have created designated Provider Care Teams.  These Care Teams include your primary Cardiologist (physician) and Advanced Practice Providers (APPs -  Physician Assistants and Nurse Practitioners) who all work together to provide you with the care you need, when you need it.  Your next appointment:   3 month(s)  The format for your next appointment:   In Person  Provider:   Truitt Merle, NP  Thank you for choosing [facility]!!    Needs a list of primary care doctors

## 2019-08-07 ENCOUNTER — Other Ambulatory Visit: Payer: Medicare Other

## 2019-08-07 ENCOUNTER — Other Ambulatory Visit: Payer: Self-pay

## 2019-08-07 ENCOUNTER — Other Ambulatory Visit (INDEPENDENT_AMBULATORY_CARE_PROVIDER_SITE_OTHER): Payer: Medicare Other

## 2019-08-07 ENCOUNTER — Encounter: Payer: Self-pay | Admitting: Physician Assistant

## 2019-08-07 ENCOUNTER — Ambulatory Visit (INDEPENDENT_AMBULATORY_CARE_PROVIDER_SITE_OTHER): Payer: Medicare Other | Admitting: Physician Assistant

## 2019-08-07 VITALS — BP 140/98 | HR 107 | Temp 97.1°F | Resp 16 | Ht 66.0 in | Wt 317.0 lb

## 2019-08-07 DIAGNOSIS — E785 Hyperlipidemia, unspecified: Secondary | ICD-10-CM

## 2019-08-07 DIAGNOSIS — E1169 Type 2 diabetes mellitus with other specified complication: Secondary | ICD-10-CM

## 2019-08-07 DIAGNOSIS — R7989 Other specified abnormal findings of blood chemistry: Secondary | ICD-10-CM | POA: Diagnosis not present

## 2019-08-07 DIAGNOSIS — R87619 Unspecified abnormal cytological findings in specimens from cervix uteri: Secondary | ICD-10-CM

## 2019-08-07 DIAGNOSIS — I10 Essential (primary) hypertension: Secondary | ICD-10-CM | POA: Diagnosis not present

## 2019-08-07 DIAGNOSIS — F319 Bipolar disorder, unspecified: Secondary | ICD-10-CM

## 2019-08-07 DIAGNOSIS — E119 Type 2 diabetes mellitus without complications: Secondary | ICD-10-CM

## 2019-08-07 DIAGNOSIS — I252 Old myocardial infarction: Secondary | ICD-10-CM

## 2019-08-07 DIAGNOSIS — M7989 Other specified soft tissue disorders: Secondary | ICD-10-CM

## 2019-08-07 LAB — MICROALBUMIN / CREATININE URINE RATIO
Creatinine,U: 45.1 mg/dL
Microalb Creat Ratio: 1.6 mg/g (ref 0.0–30.0)
Microalb, Ur: <0.7 mg/dL (ref 0.0–1.9)

## 2019-08-07 LAB — POCT GLYCOSYLATED HEMOGLOBIN (HGB A1C): Hemoglobin A1C: 9.9 % — AB (ref 4.0–5.6)

## 2019-08-07 LAB — T3, FREE: T3, Free: 4 pg/mL (ref 2.3–4.2)

## 2019-08-07 LAB — TSH: TSH: 6.45 u[IU]/mL — ABNORMAL HIGH (ref 0.35–4.50)

## 2019-08-07 LAB — T4, FREE: Free T4: 0.64 ng/dL (ref 0.60–1.60)

## 2019-08-07 LAB — VITAMIN D 25 HYDROXY (VIT D DEFICIENCY, FRACTURES): VITD: 14.27 ng/mL — ABNORMAL LOW (ref 30.00–100.00)

## 2019-08-07 MED ORDER — FREESTYLE SYSTEM KIT: 1.0000 | PACK | 0 refills | Status: DC | PRN

## 2019-08-07 MED ORDER — FREESTYLE TEST VI STRP: ORAL_STRIP | 12 refills | Status: DC

## 2019-08-07 MED ORDER — FREESTYLE LANCETS MISC: 12 refills | Status: DC

## 2019-08-07 MED ORDER — LATUDA 20 MG PO TABS: 60.0000 mg | ORAL_TABLET | Freq: Every day | ORAL | 0 refills | Status: DC

## 2019-08-07 MED ORDER — FLUOXETINE HCL 40 MG PO CAPS: 40.0000 mg | ORAL_CAPSULE | Freq: Every day | ORAL | 0 refills | Status: DC

## 2019-08-07 NOTE — Progress Notes (Signed)
Patient presents to clinic today to establish care.  Acute Concerns: Patient endorses issue over the past few weeks with swelling in bilateral lower extremities, significantly more pronounced in her RLE. Notes this started after a long care trip. Notes some occasional calf tenderness and redness. Denies hardness of skin, weeping, etc. Is concerned about a clot. Denies history of blood clot or clotting disorder. Has been trying to elevate legs while resting which helps the LLE but not so much the RLE. Notes poor diet that can be higher in salt. As noted below had history of NSTEMI with HF. Denies any PND, orthopnea, SOB.  Chronic Issues: Hypertension, CAD with history of NSTEMI, CHF 2/2 MI -- Followed by Cardiology (Dr. [name]) with most recent visit on 07/19/19. Recently established with them after a period without care. History of NSTEMI in 2017 at age 60. Had coronary angioplasty with stent placement.  Is currently on a regimen of Avapro 75 mg QD, Crestor 20 mg QD, Fenofibrate 145 mg QD, 81 mg ASA QD. Endorses taking medications as directed. Patient denies chest pain, palpitations, lightheadedness, dizziness, vision changes or frequent headaches. Notes BP still high when checked at home.   Diabetes II without known complication -- Patient is currently on a regimen of Metformin 500 mg BID. Endorses taking as directed but has a hard time tolerating the medication 2/2 nausea and diarrhea. Denies any issue with peripheral neuropathy, vision changes. Denies known history of CKD. Body mass index is 51.17 kg/m. Does not check glucose at home as she does not have a meter or supplies. Denies concerns with feet today.  Bipolar Disorder I -- Previously managed by Psychiatry. Patient is currently on a a regimen of Fluoxetine 40 mg QD, Lurasidone 60 mg QD. Notes stable mood overall. Is needing a new specialist in the area.   Health Maintenance: Immunizations -- Will obtain records.  PAP -- Overdue. Previously  followed by GYN.  Patient endorses history of abnormal PAP requiring colposcopy and LEEP procedure. Has not had follow-up since then.   Past Medical History:  Diagnosis Date  . Anxiety   . Bipolar 1 disorder, manic, full remission ([place])   . CHF (congestive heart failure) ([place])   . COPD (chronic obstructive pulmonary disease) ([place])   . Coronary artery disease   . Depression   . GERD (gastroesophageal reflux disease)   . Gout   . High cholesterol   . Hypertension   . Hypothyroidism   . Metabolic syndrome   . NSTEMI (non-ST elevated myocardial infarction) ([place]) 07/01/2016  . On home oxygen therapy    "available; don't use it" (07/01/2016)  . PCOS (polycystic ovarian syndrome)   . Stomach ulcer   . Type 2 diabetes, diet controlled ([place])     Past Surgical History:  Procedure Laterality Date  . CARDIAC CATHETERIZATION N/A 07/01/2016   Procedure: LEFT HEART CATH AND CORONARY ANGIOGRAPHY;  Surgeon: Burnell Blanks, MD;  Location: [facility];  Service: Cardiovascular;  Laterality: N/A;  . CARDIAC CATHETERIZATION N/A 07/01/2016   Procedure: Coronary Stent Intervention;  Surgeon: Burnell Blanks, MD;  Location: [facility];  Service: Cardiovascular;  Laterality: N/A;  . CARPAL TUNNEL RELEASE Bilateral   . CERVICAL BIOPSY  W/ LOOP ELECTRODE EXCISION  05/2016   "precancerous cells"  . CERVICAL BIOPSY  W/ LOOP ELECTRODE EXCISION    . CORONARY ANGIOPLASTY WITH STENT PLACEMENT  07/01/2016  . TONSILLECTOMY      Current Outpatient Medications on File Prior  to Visit  Medication Sig Dispense Refill  . albuterol (PROVENTIL) (2.5 MG/3ML) 0.083% nebulizer solution Take 3 mLs (2.5 mg total) by nebulization every 6 (six) hours as needed for wheezing or shortness of breath. 75 mL 12  . albuterol (VENTOLIN HFA) 108 (90 Base) MCG/ACT inhaler Inhale 1-2 puffs into the lungs every 6 (six) hours as needed for wheezing or shortness of breath. 1 Inhaler 3  . ascorbic acid  (VITAMIN C) 500 MG tablet Take 500 mg by mouth daily.    Marland Kitchen aspirin EC 81 MG EC tablet Take 1 tablet (81 mg total) by mouth daily. 30 tablet 0  . Coenzyme Q10 (COQ10) 100 MG CAPS Take 1 capsule by mouth daily.    . fenofibrate (TRICOR) 145 MG tablet Take 1 tablet (145 mg total) by mouth daily. 90 tablet 3  . irbesartan (AVAPRO) 75 MG tablet Take 1 tablet (75 mg total) by mouth daily. 90 tablet 3  . Magnesium 500 MG CAPS Take 1 capsule by mouth daily.    . Multiple Vitamins-Minerals (MULTIVITAMIN WITH MINERALS) tablet Take 1 tablet by mouth daily.    . nitroGLYCERIN (NITROSTAT) 0.4 MG SL tablet Place 0.4 mg under the tongue every 5 (five) minutes as needed for chest pain.    . Omega 3 1000 MG CAPS Take 1 capsule by mouth daily.    . Potassium 99 MG TABS Take 1 tablet by mouth daily.    . rosuvastatin (CRESTOR) 20 MG tablet Take 1 tablet (20 mg total) by mouth daily. 90 tablet 3  . prazosin (MINIPRESS) 2 MG capsule Take 2 mg by mouth at bedtime.  0  . prazosin (MINIPRESS) 5 MG capsule 5 mg at bedtime.   0  . [DISCONTINUED] colchicine 0.6 MG tablet Take 1 tablet (0.6 mg total) by mouth once for 1 dose. 1 tablet 0  . [DISCONTINUED] gabapentin (NEURONTIN) 300 MG capsule Take 300 mg by mouth 2 (two) times daily.    . [DISCONTINUED] temazepam (RESTORIL) 30 MG capsule Take 30 mg by mouth at bedtime.     . [DISCONTINUED] VYVANSE 70 MG capsule Take 70 mg by mouth daily.      No current facility-administered medications on file prior to visit.    Allergies  Allergen Reactions  . Latex Swelling  . Levaquin [Levofloxacin] Rash  . Metoprolol Nausea And Vomiting and Other (See Comments)    Sweats and dizziness  . Tape Rash    Family History  Problem Relation Age of Onset  . Diabetes Mother   . Heart failure Mother   . Heart attack Mother 77  . Heart disease Mother   . Alcohol abuse Mother   . Arthritis Mother   . COPD Mother   . Depression Mother   . Drug abuse Mother   . Hyperlipidemia  Mother   . Hypertension Mother   . Intellectual disability Mother   . Healthy Son   . Alcohol abuse Father   . Arthritis Father   . Depression Father   . Intellectual disability Father   . Early death Father   . Alcohol abuse Sister   . Arthritis Sister   . Depression Sister   . Intellectual disability Sister   . Intellectual disability Maternal Grandmother   . Hypertension Maternal Grandmother   . Hyperlipidemia Maternal Grandmother   . Heart disease Maternal Grandmother   . Diabetes Maternal Grandmother   . Depression Maternal Grandmother   . COPD Maternal Grandmother   . Alcohol abuse Maternal Grandfather   .  Arthritis Maternal Grandfather   . COPD Maternal Grandfather   . Diabetes Maternal Grandfather   . Heart attack Maternal Grandfather   . Heart disease Maternal Grandfather   . Hyperlipidemia Maternal Grandfather   . Hypertension Maternal Grandfather   . Intellectual disability Maternal Grandfather   . Arthritis Sister     Social History   Socioeconomic History  . Marital status: Divorced    Spouse name: Not on file  . Number of children: Not on file  . Years of education: Not on file  . Highest education level: Not on file  Occupational History  . Not on file  Tobacco Use  . Smoking status: Current Every Day Smoker    Packs/day: 0.25    Years: 26.00    Pack years: 6.50    Types: Cigarettes  . Smokeless tobacco: Never Used  Substance and Sexual Activity  . Alcohol use: No  . Drug use: Yes    Types: Marijuana    Comment: 06/28/2016 "couple times/week; if that"  . Sexual activity: Yes    Birth control/protection: None  Other Topics Concern  . Not on file  Social History Narrative  . Not on file   Social Determinants of Health   Financial Resource Strain:   . Difficulty of Paying Living Expenses: Not on file  Food Insecurity:   . Worried About Charity fundraiser in the Last Year: Not on file  . Ran Out of Food in the Last Year: Not on file    Transportation Needs:   . Lack of Transportation (Medical): Not on file  . Lack of Transportation (Non-Medical): Not on file  Physical Activity:   . Days of Exercise per Week: Not on file  . Minutes of Exercise per Session: Not on file  Stress:   . Feeling of Stress : Not on file  Social Connections:   . Frequency of Communication with Friends and Family: Not on file  . Frequency of Social Gatherings with Friends and Family: Not on file  . Attends Religious Services: Not on file  . Active Member of Clubs or Organizations: Not on file  . Attends Archivist Meetings: Not on file  . Marital Status: Not on file  Intimate Partner Violence:   . Fear of Current or Ex-Partner: Not on file  . Emotionally Abused: Not on file  . Physically Abused: Not on file  . Sexually Abused: Not on file   Review of Systems  Constitutional: Negative for fever.  Respiratory: Negative for shortness of breath and wheezing.   Cardiovascular: Positive for leg swelling. Negative for chest pain, palpitations and PND.  Gastrointestinal: Negative for abdominal pain, nausea and vomiting.  Genitourinary: Negative.   Skin: Negative for rash.  Neurological: Negative for dizziness and loss of consciousness.  Psychiatric/Behavioral: Positive for depression. Negative for hallucinations, substance abuse and suicidal ideas. The patient is nervous/anxious. The patient does not have insomnia.    BP (!) 140/98   Pulse (!) 107   Temp (!) 97.1 F (36.2 C) (Temporal)   Resp 16   Ht '5\' 6"'  (1.676 m)   Wt (!) 317 lb (143.8 kg)   SpO2 98%   BMI 51.17 kg/m   Physical Exam Vitals reviewed.  Constitutional:      Appearance: Normal appearance.  HENT:     Head: Normocephalic and atraumatic.  Eyes:     Conjunctiva/sclera: Conjunctivae normal.     Pupils: Pupils are equal, round, and reactive to light.  Cardiovascular:  Rate and Rhythm: Normal rate and regular rhythm.     Pulses: Normal pulses.           Dorsalis pedis pulses are 2+ on the right side and 2+ on the left side.       Posterior tibial pulses are 2+ on the right side and 2+ on the left side.     Heart sounds: Normal heart sounds.     Comments: Trace edema of LLE noted on examination. 2 + edema with calf swelling noted of RLE extremity. No evidence of venous stasis dermatitis or ulcer. There is some mild calf tenderness. Negative Homan sign.  Pulmonary:     Effort: Pulmonary effort is normal.     Breath sounds: Normal breath sounds.  Musculoskeletal:     Cervical back: Neck supple.  Neurological:     General: No focal deficit present.     Mental Status: She is alert and oriented to person, place, and time.  Psychiatric:        Mood and Affect: Mood normal.     Recent Results (from the past 2160 hour(s))  CBC     Status: None   Collection Time: 07/19/19 11:14 AM  Result Value Ref Range   WBC 10.0 3.4 - 10.8 x10E3/uL   RBC 4.70 3.77 - 5.28 x10E6/uL   Hemoglobin 14.6 11.1 - 15.9 g/dL   Hematocrit 41.9 34.0 - 46.6 %   MCV 89 79 - 97 fL   MCH 31.1 26.6 - 33.0 pg   MCHC 34.8 31.5 - 35.7 g/dL   RDW 12.1 11.7 - 15.4 %   Platelets 249 150 - 450 x10E3/uL  Comp Met (CMET)     Status: Abnormal   Collection Time: 07/19/19 11:14 AM  Result Value Ref Range   Glucose 261 (H) 65 - 99 mg/dL   BUN 9 6 - 24 mg/dL   Creatinine, Ser 0.79 0.57 - 1.00 mg/dL   GFR calc non Af Amer 94 >59 mL/min/1.73   GFR calc Af Amer 108 >59 mL/min/1.73   BUN/Creatinine Ratio 11 9 - 23   Sodium 134 134 - 144 mmol/L   Potassium 3.8 3.5 - 5.2 mmol/L   Chloride 99 96 - 106 mmol/L   CO2 22 20 - 29 mmol/L   Calcium 9.2 8.7 - 10.2 mg/dL   Total Protein 6.8 6.0 - 8.5 g/dL   Albumin 4.0 3.8 - 4.8 g/dL   Globulin, Total 2.8 1.5 - 4.5 g/dL   Albumin/Globulin Ratio 1.4 1.2 - 2.2   Bilirubin Total 0.2 0.0 - 1.2 mg/dL   Alkaline Phosphatase 81 39 - 117 IU/L   AST 17 0 - 40 IU/L   ALT 20 0 - 32 IU/L  POCT HgB A1C     Status: Abnormal   Collection Time:  08/07/19 10:22 AM  Result Value Ref Range   Hemoglobin A1C 9.9 (A) 4.0 - 5.6 %   HbA1c POC (<> result, manual entry)     HbA1c, POC (prediabetic range)     HbA1c, POC (controlled diabetic range)    TSH     Status: Abnormal   Collection Time: 08/07/19 10:45 AM  Result Value Ref Range   TSH 6.45 (H) 0.35 - 4.50 uIU/mL  Vitamin D (25 hydroxy)     Status: Abnormal   Collection Time: 08/07/19 10:45 AM  Result Value Ref Range   VITD 14.27 (L) 30.00 - 100.00 ng/mL  Urine Microalbumin w/creat. ratio     Status: None  Collection Time: 08/07/19 10:51 AM  Result Value Ref Range   Microalb, Ur <0.7 0.0 - 1.9 mg/dL   Creatinine,U 45.1 mg/dL   Microalb Creat Ratio 1.6 0.0 - 30.0 mg/g  T4, free     Status: None   Collection Time: 08/07/19  4:54 PM  Result Value Ref Range   Free T4 0.64 0.60 - 1.60 ng/dL    Comment: Specimens from patients who are undergoing biotin therapy and /or ingesting biotin supplements may contain high levels of biotin.  The higher biotin concentration in these specimens interferes with this Free T4 assay.  Specimens that contain high levels  of biotin may cause false high results for this Free T4 assay.  Please interpret results in light of the total clinical presentation of the patient.    T3, free     Status: None   Collection Time: 08/07/19  4:54 PM  Result Value Ref Range   T3, Free 4.0 2.3 - 4.2 pg/mL    Assessment/Plan: 1. Type 2 diabetes mellitus without complication, without long-term current use of insulin (HCC) Unable to tolerate current dose of Metfromin. Will have her decrease to once daily dosing. Will check her A1C today and Microalbumin. Recent kidney and liver functions stable overall. Would like to start her on Jardiance for DM, BP, weight and for its cardiovascular protective effects. Patient assistance paperwork given to patient to complete in case needed. Will try to push through insurance first. Glucometer and supplies sent to pharmacy. She is to  start checking fasting glucose once daily and record. Will follow-up 2 weeks.  - POCT HgB A1C - Microalbumin, urine - Urine Microalbumin w/creat. ratio  2. Hyperlipidemia associated with type 2 diabetes mellitus (HCC) Continue statin and fibrate as directed by [facility]. Dietary and exercise recommendations reviewed with patient. Follow-up with Cardiology as scheduled.   3. Essential hypertension 4. History of non-ST elevation myocardial infarction (NSTEMI) Trying to get her started on Jardiance. BP above goal. Increase Avapro to 150 mg daily. DASH diet reviewed. Follow-up 2 weeks for recheck. Continue management and follow-up per Cardiology.  5. Morbid obesity ([place]) Will check TSH and Vitamin D levels. Dietary and exercise recommendations reviewed. Consider referral to weight management.  - TSH - Vitamin D (25 hydroxy)  6. Calf swelling Peripheral edema bilaterally with R significantly > L. Will obtain US to r/o DVT. Restart Furosemide as she has not been taking. DASH diet reviewed. Elevate legs while resting. Thankfully lungs CTAB today. Follow-up scheduled. If clot present will start appropriate therapy. . - US Venous Img Lower Unilateral Right; Future  7. Bipolar 1 disorder (HCC) Medications refilled. Referral to local Psychiatrist placed.  - Ambulatory referral to Psychiatry  8. Abnormal cervical Papanicolaou smear, unspecified abnormal pap finding Patient with history of abnormal PAP and LEEP procedure 2017-2018. No follow-up since. Referral to GYN placed.  - Ambulatory referral to Gynecology  This visit occurred during the SARS-CoV-2 public health emergency.  Safety protocols were in place, including screening questions prior to the visit, additional usage of staff PPE, and extensive cleaning of exam room while observing appropriate contact time as indicated for disinfecting solutions.     Leeanne Rio, PA-C

## 2019-08-07 NOTE — Patient Instructions (Signed)
Please go to the lab today for blood work.  I will call you with your results. We will alter treatment regimen(s) if indicated by your results.   Please speak with Danae Chen at the front desk to get your [place] scheduled. Restart your Furosemide as needed. We will alter treatment based on imaging results.   Please continue metformin twice daily as directed since you just restarted.  Complete the paperwork given today so we can get Jardiance for you -- this is for diabetes but helps with weight, BP and has cardiovascular protecting qualities.  I have sent in a glucometer and supplies. Start checking blood glucose once daily each morning (fasting) and write down. Bring the log to all of your visits.   I want you to increase the Irbesartan to 2 tablets (150 mg) daily over the next 2 weeks.  Continue other medications prescribed by Dr. [name] as directed.   I have sent in refills of your mood medications to take as directed.   I have placed referrals to Psychiatry and Gynecology for you. Please use the handouts given to schedule counseling appointments.  I would like to follow-up in 2 weeks.   It was very nice meeting you today. Welcome to [facility]!

## 2019-08-08 ENCOUNTER — Ambulatory Visit
Admission: RE | Admit: 2019-08-08 | Discharge: 2019-08-08 | Disposition: A | Discharge status: Home / Self Care | Payer: Medicare Other | Source: Ambulatory Visit | Attending: Physician Assistant | Admitting: Physician Assistant

## 2019-08-08 ENCOUNTER — Other Ambulatory Visit: Payer: Self-pay | Admitting: Emergency Medicine

## 2019-08-08 DIAGNOSIS — E119 Type 2 diabetes mellitus without complications: Secondary | ICD-10-CM

## 2019-08-08 DIAGNOSIS — M7989 Other specified soft tissue disorders: Secondary | ICD-10-CM

## 2019-08-08 MED ORDER — FREESTYLE TEST VI STRP: ORAL_STRIP | 12 refills | Status: DC

## 2019-08-09 ENCOUNTER — Other Ambulatory Visit: Payer: Self-pay | Admitting: Emergency Medicine

## 2019-08-09 DIAGNOSIS — E119 Type 2 diabetes mellitus without complications: Secondary | ICD-10-CM

## 2019-08-09 DIAGNOSIS — E559 Vitamin D deficiency, unspecified: Secondary | ICD-10-CM

## 2019-08-09 DIAGNOSIS — R7989 Other specified abnormal findings of blood chemistry: Secondary | ICD-10-CM

## 2019-08-09 DIAGNOSIS — M7989 Other specified soft tissue disorders: Secondary | ICD-10-CM

## 2019-08-09 MED ORDER — JARDIANCE 10 MG PO TABS: 10.0000 mg | ORAL_TABLET | Freq: Every day | ORAL | 3 refills | Status: DC

## 2019-08-09 MED ORDER — ONETOUCH ULTRA 2 W/DEVICE KIT: 1.0000 | PACK | Freq: Every day | 0 refills | Status: DC

## 2019-08-09 MED ORDER — VITAMIN D (ERGOCALCIFEROL) 1.25 MG (50000 UNIT) PO CAPS: 50000.0000 [IU] | ORAL_CAPSULE | ORAL | 2 refills | Status: DC

## 2019-08-09 MED ORDER — FUROSEMIDE 40 MG PO TABS: 40.0000 mg | ORAL_TABLET | ORAL | 1 refills | Status: DC | PRN

## 2019-08-09 MED ORDER — ONETOUCH ULTRA VI STRP: ORAL_STRIP | 12 refills | Status: DC

## 2019-08-13 ENCOUNTER — Other Ambulatory Visit: Payer: Self-pay | Admitting: Physician Assistant

## 2019-08-13 MED ORDER — LATUDA 60 MG PO TABS: 1.0000 | ORAL_TABLET | Freq: Every day | ORAL | 1 refills | Status: DC

## 2019-08-15 ENCOUNTER — Other Ambulatory Visit: Payer: Self-pay

## 2019-08-15 DIAGNOSIS — E119 Type 2 diabetes mellitus without complications: Secondary | ICD-10-CM

## 2019-08-15 MED ORDER — ONETOUCH ULTRA VI STRP: ORAL_STRIP | 12 refills | Status: DC

## 2019-08-19 ENCOUNTER — Encounter: Payer: Self-pay | Admitting: Physician Assistant

## 2019-08-19 ENCOUNTER — Ambulatory Visit (INDEPENDENT_AMBULATORY_CARE_PROVIDER_SITE_OTHER): Payer: Medicare Other | Admitting: Physician Assistant

## 2019-08-19 ENCOUNTER — Other Ambulatory Visit: Payer: Self-pay

## 2019-08-19 DIAGNOSIS — J069 Acute upper respiratory infection, unspecified: Secondary | ICD-10-CM | POA: Diagnosis not present

## 2019-08-19 DIAGNOSIS — I10 Essential (primary) hypertension: Secondary | ICD-10-CM | POA: Diagnosis not present

## 2019-08-19 DIAGNOSIS — E119 Type 2 diabetes mellitus without complications: Secondary | ICD-10-CM

## 2019-08-19 MED ORDER — TRIAMCINOLONE ACETONIDE 55 MCG/ACT NA AERO: 2.0000 | INHALATION_SPRAY | Freq: Every day | NASAL | 12 refills | Status: DC

## 2019-08-19 MED ORDER — BENZONATATE 100 MG PO CAPS: 100.0000 mg | ORAL_CAPSULE | Freq: Three times a day (TID) | ORAL | 0 refills | Status: DC | PRN

## 2019-08-19 NOTE — Progress Notes (Signed)
I have discussed the procedure for the virtual visit with the patient who has given consent to proceed with assessment and treatment.   Labria Wos S Jonny Longino, CMA     

## 2019-08-19 NOTE — Progress Notes (Signed)
Virtual Visit via Video   I connected with patient on 08/19/19 at 10:00 AM EST by a video enabled telemedicine application and verified that I am speaking with the correct person using two identifiers.  Location patient: Home Location provider: [facility] Persons participating in the virtual visit: Patient, Provider, Martha [place] (Patina Moore)  I discussed the limitations of evaluation and management by telemedicine and the availability of in person appointments. The patient expressed understanding and agreed to proceed.  Subjective:   HPI:   Patient presents via doxy doxy.me today to follow-up regarding hypertension.  Patient also with acute URI symptoms.  Hypertension --at last visit, patient's Avapro was increased to 150 mg daily.  Patient endorses increasing medication as directed.  Is tolerating well without any noted side effects.  Notes blood pressure has improved at home but still running slightly high.  Notes blood pressures averaging 1 40-1 45 over 80s.  Does note she has had some increased stressors recently which could be contributing.   Diabetes --patient also started on Jardiance at last visit in addition to her Metformin.  Endorses tolerating the Jardiance very well so far.  Still having quite the issue with Metformin causing some loose stools.  Is checking blood sugars and notes they are running low 200s, improved from previous checks.  Patient endorses 1 week of URI symptoms.  Patient endorses nasal congestion, postnasal drip, cough and fatigue.  Denies fever, chills.  Denies chest pain or shortness of breath.  Denies loss of taste or smell.  Denies sinus pain, ear pain or tooth pain.  Denies GI symptoms.  Denies recent travel or sick contact.   ROS:   See pertinent positives and negatives per HPI.  Patient Active Problem List   Diagnosis Date Noted  . Dyslipidemia 03/15/2017  . Leukocytosis 03/15/2017  . CAD (coronary artery disease) 03/15/2017  .  Diabetes mellitus ([place])   . Chest pain 07/01/2016  . Morbid obesity ([place]) 07/01/2016  . PCOS (polycystic ovarian syndrome) 07/01/2016  . Hypertension 07/01/2016  . Bipolar 1 disorder ([place])   . NSTEMI (non-ST elevated myocardial infarction) ([place])   . Anxiety disorder 01/31/2013  . Chronic insomnia 01/31/2013  . Hypertriglyceridemia 04/27/2012  . Depression 03/29/2012    Social History   Tobacco Use  . Smoking status: Current Every Day Smoker    Packs/day: 0.25    Years: 26.00    Pack years: 6.50    Types: Cigarettes  . Smokeless tobacco: Never Used  Substance Use Topics  . Alcohol use: No    Current Outpatient Medications:  .  albuterol (PROVENTIL) (2.5 MG/3ML) 0.083% nebulizer solution, Take 3 mLs (2.5 mg total) by nebulization every 6 (six) hours as needed for wheezing or shortness of breath., Disp: 75 mL, Rfl: 12 .  albuterol (VENTOLIN HFA) 108 (90 Base) MCG/ACT inhaler, Inhale 1-2 puffs into the lungs every 6 (six) hours as needed for wheezing or shortness of breath., Disp: 1 Inhaler, Rfl: 3 .  ascorbic acid (VITAMIN C) 500 MG tablet, Take 500 mg by mouth daily., Disp: , Rfl:  .  aspirin EC 81 MG EC tablet, Take 1 tablet (81 mg total) by mouth daily., Disp: 30 tablet, Rfl: 0 .  Blood Glucose Monitoring Suppl (ONE TOUCH ULTRA 2) w/Device KIT, 1 kit by Does not apply route daily., Disp: 1 kit, Rfl: 0 .  Coenzyme Q10 (COQ10) 100 MG CAPS, Take 1 capsule by mouth daily., Disp: , Rfl:  .  empagliflozin (JARDIANCE) 10 MG TABS  tablet, Take 10 mg by mouth daily before breakfast., Disp: 30 tablet, Rfl: 3 .  fenofibrate (TRICOR) 145 MG tablet, Take 1 tablet (145 mg total) by mouth daily., Disp: 90 tablet, Rfl: 3 .  FLUoxetine (PROZAC) 40 MG capsule, Take 1 capsule (40 mg total) by mouth at bedtime., Disp: 30 capsule, Rfl: 0 .  furosemide (LASIX) 40 MG tablet, Take 1 tablet (40 mg total) by mouth as needed., Disp: 90 tablet, Rfl: 1 .  glucose blood (ONETOUCH ULTRA) test strip, Check blood  sugars once daily  before breakfast (fasting) Dx: E11.9, Disp: 100 each, Rfl: 12 .  irbesartan (AVAPRO) 75 MG tablet, Take 1 tablet (75 mg total) by mouth daily., Disp: 90 tablet, Rfl: 3 .  Lancets (FREESTYLE) lancets, Use as instructed, Disp: 100 each, Rfl: 12 .  Lurasidone HCl (LATUDA) 60 MG TABS, Take 1 tablet (60 mg total) by mouth daily., Disp: 90 tablet, Rfl: 1 .  Magnesium 500 MG CAPS, Take 1 capsule by mouth daily., Disp: , Rfl:  .  Multiple Vitamins-Minerals (MULTIVITAMIN WITH MINERALS) tablet, Take 1 tablet by mouth daily., Disp: , Rfl:  .  nitroGLYCERIN (NITROSTAT) 0.4 MG SL tablet, Place 0.4 mg under the tongue every 5 (five) minutes as needed for chest pain., Disp: , Rfl:  .  Omega 3 1000 MG CAPS, Take 1 capsule by mouth daily., Disp: , Rfl:  .  Potassium 99 MG TABS, Take 1 tablet by mouth daily., Disp: , Rfl:  .  prazosin (MINIPRESS) 2 MG capsule, Take 2 mg by mouth at bedtime., Disp: , Rfl: 0 .  prazosin (MINIPRESS) 5 MG capsule, 5 mg at bedtime. , Disp: , Rfl: 0 .  rosuvastatin (CRESTOR) 20 MG tablet, Take 1 tablet (20 mg total) by mouth daily., Disp: 90 tablet, Rfl: 3 .  Vitamin D, Ergocalciferol, (DRISDOL) 1.25 MG (50000 UNIT) CAPS capsule, Take 1 capsule (50,000 Units total) by mouth every 7 (seven) days., Disp: 4 capsule, Rfl: 2  Allergies  Allergen Reactions  . Latex Swelling  . Levaquin [Levofloxacin] Rash  . Metoprolol Nausea And Vomiting and Other (See Comments)    Sweats and dizziness  . Tape Rash    Objective:   There were no vitals taken for this visit.  Patient is well-developed, well-nourished in no acute distress.  Resting comfortably at home.  Head is normocephalic, atraumatic.  No labored breathing.  Speech is clear and coherent with logical content.  Patient is alert and oriented at baseline.   Assessment and Plan:   1. Viral URI with cough No alarm signs or symptoms consistent with Covid.  Suspect mild viral URI in combination with some  uncontrolled allergy symptoms.  Want her to increase fluids and get plenty of rest.  Mucinex if needed for congestion.  Continue OTC antihistamine daily.  Rx Nasacort for her to use once daily.  Covid signs and symptoms reviewed with patient.  Discussed with her what would constitute need for testing.  Even though this does not seem Covid related, she is to keep at home until feeling better. - triamcinolone (NASACORT) 55 MCG/ACT AERO nasal inhaler; Place 2 sprays into the nose daily.  Dispense: 1 Inhaler; Refill: 12  2. Type 2 diabetes mellitus without complication, without long-term current use of insulin (HCC) Tolerating the Jardiance well overall.  Has not been in her system long enough to reach a full therapeutic effect.  We will have her minimize carbs.  We will cut him Metformin back to once daily dosing.  Continue working on diet and exercise.  We will get her in for labs to reassess kidney function.  Remaining stable we will go ahead and increase Jardiance.  Discussed potential for Synjardy as this contains a brand of Metformin which is much less likely to cause GI symptoms.  She is interested in this.  3. Essential hypertension BP improving.  We will continue current regimen for now.  Continue DASH diet.  Continue home blood pressure monitoring.  We will follow-up in a couple weeks to reassess.  If still above goal we will make further changes to her regimen.  Patient to follow-up with her cardiologist as scheduled.    Leeanne Rio, PA-C 08/19/2019

## 2019-08-20 ENCOUNTER — Telehealth: Payer: Self-pay | Admitting: Physician Assistant

## 2019-08-20 MED ORDER — PROMETHAZINE-DM 6.25-15 MG/5ML PO SYRP: 5.0000 mL | ORAL_SOLUTION | Freq: Four times a day (QID) | ORAL | 0 refills | Status: DC | PRN

## 2019-08-20 NOTE — Telephone Encounter (Signed)
Left a detailed message on VM of medication changed to Promethazine DM. If the cough syrup is too expensive or insurance doesn't cover to take Mucinex DM or Delysm OTC.

## 2019-08-20 NOTE — Telephone Encounter (Signed)
I have sent in a script for promethazine-dm to take as directed. If insurance will not cover or too expensive, recommend Mucinex-DM or Delsym OTC.

## 2019-08-20 NOTE — Telephone Encounter (Signed)
Pt called in stating the her insurance doesn't cover the Tessalon. She wanted to know if Einar Pheasant could send in something else. Pt can be reached at the home # and she uses [facility] in [place]

## 2019-08-28 NOTE — Progress Notes (Deleted)
Psychiatric Initial Adult Assessment   Patient Identification: Kaitlyn Chambers MRN:  [id number] Date of Evaluation:  08/28/2019 Referral Source: Brunetta Jeans, PA-C Chief Complaint:   Visit Diagnosis: No diagnosis found.  History of Present Illness:   Kaitlyn Chambers is a 39 y.o. year old female with a history of bipolar I disorder by history, COPD, CAD,  , who presents for follow up appointment for No diagnosis found.  Used to be seen by psychiatry,  Lithium, clonazepam, duloxetine, buspar   Associated Signs/Symptoms: Depression Symptoms:  {DEPRESSION SYMPTOMS:20000} (Hypo) Manic Symptoms:  {BHH MANIC SYMPTOMS:22872} Anxiety Symptoms:  {BHH ANXIETY SYMPTOMS:22873} Psychotic Symptoms:  {BHH PSYCHOTIC SYMPTOMS:22874} PTSD Symptoms: {BHH PTSD SYMPTOMS:22875}  Past Psychiatric History:  Outpatient:  Psychiatry admission:  Previous suicide attempt:  Past trials of medication:  History of violence:   Previous Psychotropic Medications: {YES/NO:21197}  Substance Abuse History in the last 12 months:  {yes no:314532}  Consequences of Substance Abuse: {BHH CONSEQUENCES OF SUBSTANCE ABUSE:22880}  Past Medical History:  Past Medical History:  Diagnosis Date  . Anxiety   . Bipolar 1 disorder, manic, full remission ([place])   . CHF (congestive heart failure) ([place])   . COPD (chronic obstructive pulmonary disease) ([place])   . Coronary artery disease   . Depression   . GERD (gastroesophageal reflux disease)   . Gout   . High cholesterol   . Hypertension   . Hypothyroidism   . Metabolic syndrome   . NSTEMI (non-ST elevated myocardial infarction) ([place]) 07/01/2016  . On home oxygen therapy    "available; don't use it" (07/01/2016)  . PCOS (polycystic ovarian syndrome)   . Stomach ulcer   . Type 2 diabetes, diet controlled ([place])     Past Surgical History:  Procedure Laterality Date  . CARDIAC CATHETERIZATION N/A 07/01/2016   Procedure: LEFT HEART CATH AND CORONARY ANGIOGRAPHY;   Surgeon: Burnell Blanks, MD;  Location: [facility];  Service: Cardiovascular;  Laterality: N/A;  . CARDIAC CATHETERIZATION N/A 07/01/2016   Procedure: Coronary Stent Intervention;  Surgeon: Burnell Blanks, MD;  Location: [facility];  Service: Cardiovascular;  Laterality: N/A;  . CARPAL TUNNEL RELEASE Bilateral   . CERVICAL BIOPSY  W/ LOOP ELECTRODE EXCISION  05/2016   "precancerous cells"  . CERVICAL BIOPSY  W/ LOOP ELECTRODE EXCISION    . CORONARY ANGIOPLASTY WITH STENT PLACEMENT  07/01/2016  . TONSILLECTOMY      Family Psychiatric History: ***  Family History:  Family History  Problem Relation Age of Onset  . Diabetes Mother   . Heart failure Mother   . Heart attack Mother 25  . Heart disease Mother   . Alcohol abuse Mother   . Arthritis Mother   . COPD Mother   . Depression Mother   . Drug abuse Mother   . Hyperlipidemia Mother   . Hypertension Mother   . Intellectual disability Mother   . Healthy Son   . Alcohol abuse Father   . Arthritis Father   . Depression Father   . Intellectual disability Father   . Early death Father   . Alcohol abuse Sister   . Arthritis Sister   . Depression Sister   . Intellectual disability Sister   . Intellectual disability Maternal Grandmother   . Hypertension Maternal Grandmother   . Hyperlipidemia Maternal Grandmother   . Heart disease Maternal Grandmother   . Diabetes Maternal Grandmother   . Depression Maternal Grandmother   . COPD Maternal Grandmother   .  Alcohol abuse Maternal Grandfather   . Arthritis Maternal Grandfather   . COPD Maternal Grandfather   . Diabetes Maternal Grandfather   . Heart attack Maternal Grandfather   . Heart disease Maternal Grandfather   . Hyperlipidemia Maternal Grandfather   . Hypertension Maternal Grandfather   . Intellectual disability Maternal Grandfather   . Arthritis Sister     Social History:   Social History   Socioeconomic History  . Marital status:  Divorced    Spouse name: Not on file  . Number of children: Not on file  . Years of education: Not on file  . Highest education level: Not on file  Occupational History  . Not on file  Tobacco Use  . Smoking status: Current Every Day Smoker    Packs/day: 0.25    Years: 26.00    Pack years: 6.50    Types: Cigarettes  . Smokeless tobacco: Never Used  Substance and Sexual Activity  . Alcohol use: No  . Drug use: Yes    Types: Marijuana    Comment: 06/28/2016 "couple times/week; if that"  . Sexual activity: Yes    Birth control/protection: None  Other Topics Concern  . Not on file  Social History Narrative  . Not on file   Social Determinants of Health   Financial Resource Strain:   . Difficulty of Paying Living Expenses: Not on file  Food Insecurity:   . Worried About Charity fundraiser in the Last Year: Not on file  . Ran Out of Food in the Last Year: Not on file  Transportation Needs:   . Lack of Transportation (Medical): Not on file  . Lack of Transportation (Non-Medical): Not on file  Physical Activity:   . Days of Exercise per Week: Not on file  . Minutes of Exercise per Session: Not on file  Stress:   . Feeling of Stress : Not on file  Social Connections:   . Frequency of Communication with Friends and Family: Not on file  . Frequency of Social Gatherings with Friends and Family: Not on file  . Attends Religious Services: Not on file  . Active Member of Clubs or Organizations: Not on file  . Attends Archivist Meetings: Not on file  . Marital Status: Not on file    Additional Social History: ***  Allergies:   Allergies  Allergen Reactions  . Latex Swelling  . Levaquin [Levofloxacin] Rash  . Metoprolol Nausea And Vomiting and Other (See Comments)    Sweats and dizziness  . Tape Rash    Metabolic Disorder Labs: Lab Results  Component Value Date   HGBA1C 9.9 (A) 08/07/2019   MPG 146 07/01/2016   No results found for: PROLACTIN Lab  Results  Component Value Date   CHOL 193 03/15/2017   TRIG 405 (H) 03/15/2017   HDL 26 (L) 03/15/2017   CHOLHDL 7.4 03/15/2017   VLDL UNABLE TO CALCULATE IF TRIGLYCERIDE OVER 400 mg/dL 03/15/2017   LDLCALC UNABLE TO CALCULATE IF TRIGLYCERIDE OVER 400 mg/dL 03/15/2017   LDLCALC UNABLE TO CALCULATE IF TRIGLYCERIDE OVER 400 mg/dL 07/02/2016   Lab Results  Component Value Date   TSH 6.45 (H) 08/07/2019    Therapeutic Level Labs: No results found for: LITHIUM No results found for: CBMZ No results found for: VALPROATE  Current Medications: Current Outpatient Medications  Medication Sig Dispense Refill  . albuterol (PROVENTIL) (2.5 MG/3ML) 0.083% nebulizer solution Take 3 mLs (2.5 mg total) by nebulization every 6 (six) hours as  needed for wheezing or shortness of breath. 75 mL 12  . albuterol (VENTOLIN HFA) 108 (90 Base) MCG/ACT inhaler Inhale 1-2 puffs into the lungs every 6 (six) hours as needed for wheezing or shortness of breath. 1 Inhaler 3  . ascorbic acid (VITAMIN C) 500 MG tablet Take 500 mg by mouth daily.    Marland Kitchen aspirin EC 81 MG EC tablet Take 1 tablet (81 mg total) by mouth daily. 30 tablet 0  . Blood Glucose Monitoring Suppl (ONE TOUCH ULTRA 2) w/Device KIT 1 kit by Does not apply route daily. 1 kit 0  . Coenzyme Q10 (COQ10) 100 MG CAPS Take 1 capsule by mouth daily.    . empagliflozin (JARDIANCE) 10 MG TABS tablet Take 10 mg by mouth daily before breakfast. 30 tablet 3  . fenofibrate (TRICOR) 145 MG tablet Take 1 tablet (145 mg total) by mouth daily. 90 tablet 3  . FLUoxetine (PROZAC) 40 MG capsule Take 1 capsule (40 mg total) by mouth at bedtime. 30 capsule 0  . furosemide (LASIX) 40 MG tablet Take 1 tablet (40 mg total) by mouth as needed. 90 tablet 1  . glucose blood (ONETOUCH ULTRA) test strip Check blood sugars once daily  before breakfast (fasting) Dx: E11.9 100 each 12  . irbesartan (AVAPRO) 75 MG tablet Take 1 tablet (75 mg total) by mouth daily. 90 tablet 3  . Lancets  (FREESTYLE) lancets Use as instructed 100 each 12  . Lurasidone HCl (LATUDA) 60 MG TABS Take 1 tablet (60 mg total) by mouth daily. 90 tablet 1  . Magnesium 500 MG CAPS Take 1 capsule by mouth daily.    . Multiple Vitamins-Minerals (MULTIVITAMIN WITH MINERALS) tablet Take 1 tablet by mouth daily.    . nitroGLYCERIN (NITROSTAT) 0.4 MG SL tablet Place 0.4 mg under the tongue every 5 (five) minutes as needed for chest pain.    . Omega 3 1000 MG CAPS Take 1 capsule by mouth daily.    . Potassium 99 MG TABS Take 1 tablet by mouth daily.    . prazosin (MINIPRESS) 2 MG capsule Take 2 mg by mouth at bedtime.  0  . prazosin (MINIPRESS) 5 MG capsule 5 mg at bedtime.   0  . promethazine-dextromethorphan (PROMETHAZINE-DM) 6.25-15 MG/5ML syrup Take 5 mLs by mouth 4 (four) times daily as needed for cough. 118 mL 0  . rosuvastatin (CRESTOR) 20 MG tablet Take 1 tablet (20 mg total) by mouth daily. 90 tablet 3  . triamcinolone (NASACORT) 55 MCG/ACT AERO nasal inhaler Place 2 sprays into the nose daily. 1 Inhaler 12  . Vitamin D, Ergocalciferol, (DRISDOL) 1.25 MG (50000 UNIT) CAPS capsule Take 1 capsule (50,000 Units total) by mouth every 7 (seven) days. 4 capsule 2   No current facility-administered medications for this visit.    Musculoskeletal: Strength & Muscle Tone: N/A Gait & Station: N/A Patient leans: N/A  Psychiatric Specialty Exam: Review of Systems  There were no vitals taken for this visit.There is no height or weight on file to calculate BMI.  General Appearance: {Appearance:22683}  Eye Contact:  {BHH EYE CONTACT:22684}  Speech:  Clear and Coherent  Volume:  Normal  Mood:  {BHH MOOD:22306}  Affect:  {Affect (PAA):22687}  Thought Process:  Coherent  Orientation:  Full (Time, Place, and Person)  Thought Content:  Logical  Suicidal Thoughts:  {ST/HT (PAA):22692}  Homicidal Thoughts:  {ST/HT (PAA):22692}  Memory:  Immediate;   Good  Judgement:  {Judgement (PAA):22694}  Insight:  {Insight  (PAA):22695}  Psychomotor Activity:  Normal  Concentration:  Concentration: Good and Attention Span: Good  Recall:  Good  Fund of Knowledge:Good  Language: Good  Akathisia:  No  Handed:  Right  AIMS (if indicated):  not done  Assets:  Communication Skills Desire for Improvement  ADL's:  Intact  Cognition: WNL  Sleep:  {BHH GOOD/FAIR/POOR:22877}   Screenings:   Assessment and Plan:    Plan  The patient demonstrates the following risk factors for suicide: Chronic risk factors for suicide include: {Chronic Risk Factors for WOEHOZY:24825003}. Acute risk factors for suicide include: {Acute Risk Factors for BCWUGQB:16945038}. Protective factors for this patient include: {Protective Factors for Suicide UEKC:00349179}. Considering these factors, the overall suicide risk at this point appears to be {Desc; low/moderate/high:110033}. Patient {ACTION; IS/IS XTA:56979480} appropriate for outpatient follow up.   Norman Clay, MD 2/24/20212:12 PM

## 2019-09-05 ENCOUNTER — Ambulatory Visit (HOSPITAL_COMMUNITY): Payer: Medicare Other | Admitting: Psychiatry

## 2019-09-05 ENCOUNTER — Telehealth (HOSPITAL_COMMUNITY): Payer: Self-pay | Admitting: Psychiatry

## 2019-09-05 ENCOUNTER — Other Ambulatory Visit: Payer: Self-pay

## 2019-09-05 ENCOUNTER — Ambulatory Visit (HOSPITAL_COMMUNITY): Payer: Self-pay | Admitting: Psychiatry

## 2019-09-05 NOTE — Telephone Encounter (Signed)
Sent link for video visit through D[facility] me. Patient did not sign in. Called the patient  twice for appointment scheduled today. The patient did not answer the phone. Left voice message to contact the office.  

## 2019-09-12 ENCOUNTER — Other Ambulatory Visit: Payer: Self-pay | Admitting: *Deleted

## 2019-09-12 DIAGNOSIS — M25569 Pain in unspecified knee: Secondary | ICD-10-CM

## 2019-09-12 DIAGNOSIS — R609 Edema, unspecified: Secondary | ICD-10-CM

## 2019-09-13 ENCOUNTER — Encounter (HOSPITAL_COMMUNITY): Payer: Medicare Other

## 2019-09-16 ENCOUNTER — Ambulatory Visit (HOSPITAL_COMMUNITY)
Admission: RE | Admit: 2019-09-16 | Discharge: 2019-09-16 | Disposition: A | Discharge status: Home / Self Care | Payer: Medicare Other | Source: Ambulatory Visit | Attending: Vascular Surgery | Admitting: Vascular Surgery

## 2019-09-16 ENCOUNTER — Other Ambulatory Visit: Payer: Self-pay

## 2019-09-16 DIAGNOSIS — R609 Edema, unspecified: Secondary | ICD-10-CM | POA: Diagnosis not present

## 2019-09-16 DIAGNOSIS — M25561 Pain in right knee: Secondary | ICD-10-CM | POA: Diagnosis not present

## 2019-09-16 DIAGNOSIS — M25569 Pain in unspecified knee: Secondary | ICD-10-CM | POA: Diagnosis present

## 2019-09-17 ENCOUNTER — Encounter: Payer: Self-pay | Admitting: Vascular Surgery

## 2019-09-17 ENCOUNTER — Ambulatory Visit (INDEPENDENT_AMBULATORY_CARE_PROVIDER_SITE_OTHER): Payer: Medicare Other | Admitting: Vascular Surgery

## 2019-09-17 DIAGNOSIS — I872 Venous insufficiency (chronic) (peripheral): Secondary | ICD-10-CM

## 2019-09-17 NOTE — Progress Notes (Signed)
Patient name: Kaitlyn Chambers MRN: [id number] DOB: [birth year] Sex: female  REASON FOR CONSULT: Right leg swelling  HPI: Kaitlyn Chambers is a 39 y.o. female, with history of diabetes, PCOS, COPD, CHF, coronary disease status post NSTEMI, hypertension, hyperlipidemia, depression presents for evaluation of right leg swelling.  She states her right leg has had increased swelling over the last several months.  She denies any history of DVT.  She denies any associated trauma in the right lower extremity.  She never had any vascular interventions.  Very nervous about risk of losing her leg and states her mom required bilateral lower extremity amputations.  States she has lost about 50 pounds over the last little while and is doing her best to get healthy for her adopted children.  She has tried to wear compression in the right lower extremity but states it normally does not fit.  Past Medical History:  Diagnosis Date  . Anxiety   . Bipolar 1 disorder, manic, full remission ([place])   . CHF (congestive heart failure) ([place])   . COPD (chronic obstructive pulmonary disease) ([place])   . Coronary artery disease   . Depression   . GERD (gastroesophageal reflux disease)   . Gout   . High cholesterol   . Hypertension   . Hypothyroidism   . Metabolic syndrome   . NSTEMI (non-ST elevated myocardial infarction) ([place]) 07/01/2016  . On home oxygen therapy    "available; don't use it" (07/01/2016)  . PCOS (polycystic ovarian syndrome)   . Stomach ulcer   . Type 2 diabetes, diet controlled ([place])     Past Surgical History:  Procedure Laterality Date  . CARDIAC CATHETERIZATION N/A 07/01/2016   Procedure: LEFT HEART CATH AND CORONARY ANGIOGRAPHY;  Surgeon: Burnell Blanks, MD;  Location: [facility];  Service: Cardiovascular;  Laterality: N/A;  . CARDIAC CATHETERIZATION N/A 07/01/2016   Procedure: Coronary Stent Intervention;  Surgeon: Burnell Blanks, MD;  Location: [facility];   Service: Cardiovascular;  Laterality: N/A;  . CARPAL TUNNEL RELEASE Bilateral   . CERVICAL BIOPSY  W/ LOOP ELECTRODE EXCISION  05/2016   "precancerous cells"  . CERVICAL BIOPSY  W/ LOOP ELECTRODE EXCISION    . CORONARY ANGIOPLASTY WITH STENT PLACEMENT  07/01/2016  . TONSILLECTOMY      Family History  Problem Relation Age of Onset  . Diabetes Mother   . Heart failure Mother   . Heart attack Mother 42  . Heart disease Mother   . Alcohol abuse Mother   . Arthritis Mother   . COPD Mother   . Depression Mother   . Drug abuse Mother   . Hyperlipidemia Mother   . Hypertension Mother   . Intellectual disability Mother   . Healthy Son   . Alcohol abuse Father   . Arthritis Father   . Depression Father   . Intellectual disability Father   . Early death Father   . Alcohol abuse Sister   . Arthritis Sister   . Depression Sister   . Intellectual disability Sister   . Intellectual disability Maternal Grandmother   . Hypertension Maternal Grandmother   . Hyperlipidemia Maternal Grandmother   . Heart disease Maternal Grandmother   . Diabetes Maternal Grandmother   . Depression Maternal Grandmother   . COPD Maternal Grandmother   . Alcohol abuse Maternal Grandfather   . Arthritis Maternal Grandfather   . COPD Maternal Grandfather   . Diabetes Maternal Grandfather   . Heart  attack Maternal Grandfather   . Heart disease Maternal Grandfather   . Hyperlipidemia Maternal Grandfather   . Hypertension Maternal Grandfather   . Intellectual disability Maternal Grandfather   . Arthritis Sister     SOCIAL HISTORY: Social History   Socioeconomic History  . Marital status: Divorced    Spouse name: Not on file  . Number of children: Not on file  . Years of education: Not on file  . Highest education level: Not on file  Occupational History  . Not on file  Tobacco Use  . Smoking status: Current Every Day Smoker    Packs/day: 0.25    Years: 26.00    Pack years: 6.50    Types:  Cigarettes  . Smokeless tobacco: Never Used  Substance and Sexual Activity  . Alcohol use: No  . Drug use: Yes    Types: Marijuana    Comment: 06/28/2016 "couple times/week; if that"  . Sexual activity: Yes    Birth control/protection: None  Other Topics Concern  . Not on file  Social History Narrative  . Not on file   Social Determinants of Health   Financial Resource Strain:   . Difficulty of Paying Living Expenses:   Food Insecurity:   . Worried About Charity fundraiser in the Last Year:   . Arboriculturist in the Last Year:   Transportation Needs:   . Film/video editor (Medical):   Marland Kitchen Lack of Transportation (Non-Medical):   Physical Activity:   . Days of Exercise per Week:   . Minutes of Exercise per Session:   Stress:   . Feeling of Stress :   Social Connections:   . Frequency of Communication with Friends and Family:   . Frequency of Social Gatherings with Friends and Family:   . Attends Religious Services:   . Active Member of Clubs or Organizations:   . Attends Archivist Meetings:   Marland Kitchen Marital Status:   Intimate Partner Violence:   . Fear of Current or Ex-Partner:   . Emotionally Abused:   Marland Kitchen Physically Abused:   . Sexually Abused:     Allergies  Allergen Reactions  . Latex Swelling  . Levaquin [Levofloxacin] Rash  . Metoprolol Nausea And Vomiting and Other (See Comments)    Sweats and dizziness  . Tape Rash    Current Outpatient Medications  Medication Sig Dispense Refill  . albuterol (PROVENTIL) (2.5 MG/3ML) 0.083% nebulizer solution Take 3 mLs (2.5 mg total) by nebulization every 6 (six) hours as needed for wheezing or shortness of breath. 75 mL 12  . albuterol (VENTOLIN HFA) 108 (90 Base) MCG/ACT inhaler Inhale 1-2 puffs into the lungs every 6 (six) hours as needed for wheezing or shortness of breath. 1 Inhaler 3  . ascorbic acid (VITAMIN C) 500 MG tablet Take 500 mg by mouth daily.    Marland Kitchen aspirin EC 81 MG EC tablet Take 1 tablet (81 mg  total) by mouth daily. 30 tablet 0  . Blood Glucose Monitoring Suppl (ONE TOUCH ULTRA 2) w/Device KIT 1 kit by Does not apply route daily. 1 kit 0  . Coenzyme Q10 (COQ10) 100 MG CAPS Take 1 capsule by mouth daily.    . empagliflozin (JARDIANCE) 10 MG TABS tablet Take 10 mg by mouth daily before breakfast. 30 tablet 3  . fenofibrate (TRICOR) 145 MG tablet Take 1 tablet (145 mg total) by mouth daily. 90 tablet 3  . FLUoxetine (PROZAC) 40 MG capsule Take 1 capsule (  40 mg total) by mouth at bedtime. 30 capsule 0  . furosemide (LASIX) 40 MG tablet Take 1 tablet (40 mg total) by mouth as needed. 90 tablet 1  . glucose blood (ONETOUCH ULTRA) test strip Check blood sugars once daily  before breakfast (fasting) Dx: E11.9 100 each 12  . irbesartan (AVAPRO) 75 MG tablet Take 1 tablet (75 mg total) by mouth daily. 90 tablet 3  . Lancets (FREESTYLE) lancets Use as instructed 100 each 12  . Lurasidone HCl (LATUDA) 60 MG TABS Take 1 tablet (60 mg total) by mouth daily. 90 tablet 1  . Magnesium 500 MG CAPS Take 1 capsule by mouth daily.    . Multiple Vitamins-Minerals (MULTIVITAMIN WITH MINERALS) tablet Take 1 tablet by mouth daily.    . nitroGLYCERIN (NITROSTAT) 0.4 MG SL tablet Place 0.4 mg under the tongue every 5 (five) minutes as needed for chest pain.    . Omega 3 1000 MG CAPS Take 1 capsule by mouth daily.    . Potassium 99 MG TABS Take 1 tablet by mouth daily.    . prazosin (MINIPRESS) 2 MG capsule Take 2 mg by mouth at bedtime.  0  . prazosin (MINIPRESS) 5 MG capsule 5 mg at bedtime.   0  . promethazine-dextromethorphan (PROMETHAZINE-DM) 6.25-15 MG/5ML syrup Take 5 mLs by mouth 4 (four) times daily as needed for cough. 118 mL 0  . rosuvastatin (CRESTOR) 20 MG tablet Take 1 tablet (20 mg total) by mouth daily. 90 tablet 3  . triamcinolone (NASACORT) 55 MCG/ACT AERO nasal inhaler Place 2 sprays into the nose daily. 1 Inhaler 12  . Vitamin D, Ergocalciferol, (DRISDOL) 1.25 MG (50000 UNIT) CAPS capsule  Take 1 capsule (50,000 Units total) by mouth every 7 (seven) days. 4 capsule 2   No current facility-administered medications for this visit.    REVIEW OF SYSTEMS:  _0  denotes positive finding, _1  denotes negative finding Cardiac  Comments:  Chest pain or chest pressure:    Shortness of breath upon exertion:    Short of breath when lying flat:    Irregular heart rhythm:        Vascular    Pain in calf, thigh, or hip brought on by ambulation: x   Pain in feet at night that wakes you up from your sleep:  x   Blood clot in your veins:    Leg swelling:  x Right worse      Pulmonary    Oxygen at home:    Productive cough:     Wheezing:         Neurologic    Sudden weakness in arms or legs:     Sudden numbness in arms or legs:     Sudden onset of difficulty speaking or slurred speech:    Temporary loss of vision in one eye:     Problems with dizziness:         Gastrointestinal    Blood in stool:     Vomited blood:         Genitourinary    Burning when urinating:     Blood in urine:        Psychiatric    Major depression:         Hematologic    Bleeding problems:    Problems with blood clotting too easily:        Skin    Rashes or ulcers:        Constitutional    Fever  or chills:      PHYSICAL EXAM: Vitals:   09/17/19 0924  BP: (!) 154/101  Pulse: 86  Resp: 16  Temp: (!) 97.4 F (36.3 C)  TempSrc: Temporal  SpO2: 100%  Weight: (!) 308 lb (139.7 kg)  Height: _0  (1.702 m)    GENERAL: The patient is a well-nourished female, in no acute distress. The vital signs are documented above. CARDIAC: There is a regular rate and rhythm.  VASCULAR:  Palpable femoral pulses bilaterally Palpable dorsalis pedis pulses bilaterally No lower extremity tissue loss Right leg more swollen than left, no skin thickening or venous ulcer PULMONARY: There is good air exchange bilaterally without wheezing or rales. ABDOMEN: Soft and non-tender with normal pitched bowel  sounds.  MUSCULOSKELETAL: There are no major deformities or cyanosis. NEUROLOGIC: No focal weakness or paresthesias are detected. SKIN: There are no ulcers or rashes noted. PSYCHIATRIC: The patient has a normal affect.  DATA:   I independently reviewed her right lower extremity reflux study and there is no evidence of DVT, no evidence of deep vein reflux, she has very focal superficial reflux at saphenofemoral junction as well as the proximal small saphenous  Assessment/Plan:  39 year old female who presents with chronic right lower extremity leg swelling in the setting of multiple medical comorbidities as documented above.  She is very concerned about her risk of losing her legs and I discussed that she has normal pulse exam with palpable dorsalis pedis pulses on exam and I see no evidence of significant arterial disease at this time.  Her right leg swelling is likely related to underlying venous insufficiency as noted on reflux study.  There is no evidence of DVT.  Her reflux is very focal at the saphenofemoral junction and proximal small saphenous and discussed that given no long segment superficial reflux would not be amendable to surgical intervention or laser ablation.  We would recommend treating this conservatively.  I recommended lower extremity leg elevation with compression and weight loss.  We have measured her for compression socks today and I think she is probably only able tolerate knee-high compression and we will give her information for elastic therapy.  She can follow-up as needed.   Marty Heck, MD [facility] Office: [phone]

## 2019-10-03 ENCOUNTER — Other Ambulatory Visit: Payer: Medicare Other

## 2019-10-22 ENCOUNTER — Other Ambulatory Visit: Payer: Self-pay | Admitting: Physician Assistant

## 2019-12-23 ENCOUNTER — Other Ambulatory Visit: Payer: Self-pay | Admitting: Physician Assistant

## 2019-12-23 DIAGNOSIS — E119 Type 2 diabetes mellitus without complications: Secondary | ICD-10-CM

## 2019-12-23 MED ORDER — EMPAGLIFLOZIN 10 MG PO TABS: 10.0000 mg | ORAL_TABLET | Freq: Every day | ORAL | 0 refills | Status: DC

## 2020-03-03 ENCOUNTER — Encounter: Payer: Self-pay | Admitting: Physician Assistant

## 2020-03-03 ENCOUNTER — Other Ambulatory Visit: Payer: Self-pay | Admitting: Physician Assistant

## 2020-03-03 DIAGNOSIS — E119 Type 2 diabetes mellitus without complications: Secondary | ICD-10-CM

## 2020-03-03 NOTE — Progress Notes (Deleted)
Patient overdue for diabetic follow-up. Can we reach out to her to schedule?

## 2020-03-16 ENCOUNTER — Encounter: Payer: Self-pay | Admitting: Cardiology

## 2020-04-06 ENCOUNTER — Other Ambulatory Visit: Payer: Self-pay | Admitting: Physician Assistant

## 2020-04-06 DIAGNOSIS — M7989 Other specified soft tissue disorders: Secondary | ICD-10-CM

## 2020-07-12 DIAGNOSIS — J449 Chronic obstructive pulmonary disease, unspecified: Secondary | ICD-10-CM | POA: Diagnosis not present

## 2020-08-12 DIAGNOSIS — J449 Chronic obstructive pulmonary disease, unspecified: Secondary | ICD-10-CM | POA: Diagnosis not present

## 2020-08-22 DIAGNOSIS — I10 Essential (primary) hypertension: Secondary | ICD-10-CM | POA: Diagnosis not present

## 2020-09-09 DIAGNOSIS — J449 Chronic obstructive pulmonary disease, unspecified: Secondary | ICD-10-CM | POA: Diagnosis not present

## 2020-10-10 DIAGNOSIS — J449 Chronic obstructive pulmonary disease, unspecified: Secondary | ICD-10-CM | POA: Diagnosis not present

## 2020-10-31 ENCOUNTER — Encounter (HOSPITAL_COMMUNITY): Payer: Self-pay

## 2020-10-31 ENCOUNTER — Emergency Department (HOSPITAL_COMMUNITY)
Admission: EM | Admit: 2020-10-31 | Discharge: 2020-10-31 | Disposition: A | Discharge status: Home / Self Care | Payer: Medicare Other | Attending: Emergency Medicine | Admitting: Emergency Medicine

## 2020-10-31 ENCOUNTER — Other Ambulatory Visit: Payer: Self-pay

## 2020-10-31 DIAGNOSIS — I2510 Atherosclerotic heart disease of native coronary artery without angina pectoris: Secondary | ICD-10-CM | POA: Insufficient documentation

## 2020-10-31 DIAGNOSIS — I119 Hypertensive heart disease without heart failure: Secondary | ICD-10-CM | POA: Insufficient documentation

## 2020-10-31 DIAGNOSIS — J449 Chronic obstructive pulmonary disease, unspecified: Secondary | ICD-10-CM | POA: Insufficient documentation

## 2020-10-31 DIAGNOSIS — R609 Edema, unspecified: Secondary | ICD-10-CM | POA: Insufficient documentation

## 2020-10-31 DIAGNOSIS — E119 Type 2 diabetes mellitus without complications: Secondary | ICD-10-CM | POA: Diagnosis not present

## 2020-10-31 DIAGNOSIS — S60461A Insect bite (nonvenomous) of left index finger, initial encounter: Secondary | ICD-10-CM | POA: Insufficient documentation

## 2020-10-31 DIAGNOSIS — Z760 Encounter for issue of repeat prescription: Secondary | ICD-10-CM | POA: Diagnosis not present

## 2020-10-31 DIAGNOSIS — Z91040 Latex allergy status: Secondary | ICD-10-CM | POA: Insufficient documentation

## 2020-10-31 DIAGNOSIS — W57XXXA Bitten or stung by nonvenomous insect and other nonvenomous arthropods, initial encounter: Secondary | ICD-10-CM | POA: Diagnosis not present

## 2020-10-31 DIAGNOSIS — Z7982 Long term (current) use of aspirin: Secondary | ICD-10-CM | POA: Insufficient documentation

## 2020-10-31 DIAGNOSIS — E039 Hypothyroidism, unspecified: Secondary | ICD-10-CM | POA: Insufficient documentation

## 2020-10-31 DIAGNOSIS — F17210 Nicotine dependence, cigarettes, uncomplicated: Secondary | ICD-10-CM | POA: Diagnosis not present

## 2020-10-31 DIAGNOSIS — R600 Localized edema: Secondary | ICD-10-CM

## 2020-10-31 MED ORDER — FUROSEMIDE 40 MG PO TABS: 40.0000 mg | ORAL_TABLET | Freq: Every day | ORAL | 0 refills | Status: DC | PRN

## 2020-10-31 MED ORDER — IRBESARTAN 75 MG PO TABS: 75.0000 mg | ORAL_TABLET | Freq: Every day | ORAL | 0 refills | Status: DC

## 2020-10-31 NOTE — Discharge Instructions (Signed)
Your exam looks reassuring, I suspect you may have a bug bite and you are having a reaction from the bug bite.  Please do not pop the blister, if it does pop please do basic wound care, rinse out the wound twice a day, apply antiseptic ointment to the area.  If you start to see redness around the area, no discharge from the area, develop fevers or chills please come back for further evaluation.    I have refilled your medications please take as prescribed.  Also given you a referral to a primary care provider please call to schedule a follow-up appointment.  I am concerned for possible blood clot in your right leg I schedule a outpatient DVT study, please call the number provided in this paperwork to schedule a appointment.  Come back to the emergency department if you develop chest pain, shortness of breath, severe abdominal pain, uncontrolled nausea, vomiting, diarrhea.

## 2020-10-31 NOTE — ED Provider Notes (Signed)
[facility] EMERGENCY DEPARTMENT Provider Note   CSN: [id number] Arrival date & time: 10/31/20  1538     History Chief Complaint  Patient presents with  . Insect Bite    Kaitlyn Chambers is a 40 y.o. female.  HPI   Patient with significant medical history of anxiety, bipolar, CHF, COPD, CAD, GERD, hypertension type 2 diabetes presents with chief complaint of spider bite.  Patient states she noted two small puncture wounds on her left index finger which remind her of a possible spider bite, she then developed a small blister but as time went on the blister got very large, the blister is tender to touch, and she has decreased mobility at her PIP joint, she denies paresthesias in her finger.  Patient denies systemic rash, tongue or throat swelling, difficulty breathing, states this has never happened to her in the past, she is up-to-date on her tetanus shot.  She also tells me that she has been without her medications for the last month as she does not have a primary care doctor, has been out of her Lasix and blood pressure medications.  She believes is the reason why her right leg has been more swollen.  she denies calf pain, recent travels, recent injury to the area, states that she was diagnosed with a DVT and has lymphedema from that.  Patient denies chest pain, shortness of breath, denies orthopnea, has no history of liver cirrhosis denies alcohol use.  Patient denies headaches, fevers, chills, shortness of breath, chest pain, abdominal pain, nausea, vomiting, diarrhea.   Past Medical History:  Diagnosis Date  . Anxiety   . Bipolar 1 disorder, manic, full remission ([place])   . CHF (congestive heart failure) ([place])   . COPD (chronic obstructive pulmonary disease) ([place])   . Coronary artery disease   . Depression   . GERD (gastroesophageal reflux disease)   . Gout   . High cholesterol   . Hypertension   . Hypothyroidism   . Metabolic syndrome   . NSTEMI (non-ST elevated myocardial  infarction) ([place]) 07/01/2016  . On home oxygen therapy    "available; don't use it" (07/01/2016)  . PCOS (polycystic ovarian syndrome)   . Stomach ulcer   . Type 2 diabetes, diet controlled [facility])     Patient Active Problem List   Diagnosis Date Noted  . Chronic venous insufficiency 09/17/2019  . Dyslipidemia 03/15/2017  . Leukocytosis 03/15/2017  . CAD (coronary artery disease) 03/15/2017  . Diabetes mellitus ([place])   . Morbid obesity ([place]) 07/01/2016  . PCOS (polycystic ovarian syndrome) 07/01/2016  . Hypertension 07/01/2016  . Bipolar 1 disorder ([place])   . NSTEMI (non-ST elevated myocardial infarction) ([place])   . Chronic insomnia 01/31/2013    Past Surgical History:  Procedure Laterality Date  . CARDIAC CATHETERIZATION N/A 07/01/2016   Procedure: LEFT HEART CATH AND CORONARY ANGIOGRAPHY;  Surgeon: Burnell Blanks, MD;  Location: [place] [facility];  Service: Cardiovascular;  Laterality: N/A;  . CARDIAC CATHETERIZATION N/A 07/01/2016   Procedure: Coronary Stent Intervention;  Surgeon: Burnell Blanks, MD;  Location: [place] [facility];  Service: Cardiovascular;  Laterality: N/A;  . CARPAL TUNNEL RELEASE Bilateral   . CERVICAL BIOPSY  W/ LOOP ELECTRODE EXCISION  05/2016   "precancerous cells"  . CERVICAL BIOPSY  W/ LOOP ELECTRODE EXCISION    . CORONARY ANGIOPLASTY WITH STENT PLACEMENT  07/01/2016  . TONSILLECTOMY       OB History    Gravida  1  Para      Term  0   Preterm  0   AB  1   Living  0     SAB  1   IAB  0   Ectopic  0   Multiple  0   Live Births              Family History  Problem Relation Age of Onset  . Diabetes Mother   . Heart failure Mother   . Heart attack Mother 62  . Heart disease Mother   . Alcohol abuse Mother   . Arthritis Mother   . COPD Mother   . Depression Mother   . Drug abuse Mother   . Hyperlipidemia Mother   . Hypertension Mother   . Intellectual disability Mother   . Healthy Son   . Alcohol  abuse Father   . Arthritis Father   . Depression Father   . Intellectual disability Father   . Early death Father   . Alcohol abuse Sister   . Arthritis Sister   . Depression Sister   . Intellectual disability Sister   . Intellectual disability Maternal Grandmother   . Hypertension Maternal Grandmother   . Hyperlipidemia Maternal Grandmother   . Heart disease Maternal Grandmother   . Diabetes Maternal Grandmother   . Depression Maternal Grandmother   . COPD Maternal Grandmother   . Alcohol abuse Maternal Grandfather   . Arthritis Maternal Grandfather   . COPD Maternal Grandfather   . Diabetes Maternal Grandfather   . Heart attack Maternal Grandfather   . Heart disease Maternal Grandfather   . Hyperlipidemia Maternal Grandfather   . Hypertension Maternal Grandfather   . Intellectual disability Maternal Grandfather   . Arthritis Sister     Social History   Tobacco Use  . Smoking status: Current Every Day Smoker    Packs/day: 0.25    Years: 26.00    Pack years: 6.50    Types: Cigarettes  . Smokeless tobacco: Never Used  Vaping Use  . Vaping Use: Never used  Substance Use Topics  . Alcohol use: No  . Drug use: Not Currently    Types: Marijuana    Home Medications Prior to Admission medications   Medication Sig Start Date End Date Taking? Authorizing Provider  furosemide (LASIX) 40 MG tablet Take 1 tablet (40 mg total) by mouth daily as needed. 10/31/20 11/30/20 Yes Marcello Fennel, PA-C  irbesartan (AVAPRO) 75 MG tablet Take 1 tablet (75 mg total) by mouth daily. 10/31/20 11/30/20 Yes Marcello Fennel, PA-C  albuterol (PROVENTIL) (2.5 MG/3ML) 0.083% nebulizer solution Take 3 mLs (2.5 mg total) by nebulization every 6 (six) hours as needed for wheezing or shortness of breath. 12/03/18   Tacy Learn, PA-C  albuterol (VENTOLIN HFA) 108 (90 Base) MCG/ACT inhaler Inhale 1-2 puffs into the lungs every 6 (six) hours as needed for wheezing or shortness of breath. 12/03/18    Tacy Learn, PA-C  ascorbic acid (VITAMIN C) 500 MG tablet Take 500 mg by mouth daily.    [provider]  aspirin EC 81 MG EC tablet Take 1 tablet (81 mg total) by mouth daily. 07/04/16   Hongalgi, Lenis Dickinson, MD  Blood Glucose Monitoring Suppl (ONE TOUCH ULTRA 2) w/Device KIT 1 kit by Does not apply route daily. 08/09/19   Brunetta Jeans, PA-C  Coenzyme Q10 (COQ10) 100 MG CAPS Take 1 capsule by mouth daily.    [provider]  empagliflozin (JARDIANCE)  10 MG TABS tablet Take 1 tablet (10 mg total) by mouth daily before breakfast. No further refills without follow-up. 12/23/19   Brunetta Jeans, PA-C  fenofibrate (TRICOR) 145 MG tablet Take 1 tablet (145 mg total) by mouth daily. 07/19/19   Jerline Pain, MD  FLUoxetine (PROZAC) 40 MG capsule Take 1 capsule (40 mg total) by mouth at bedtime. Further refills will come from Psychiatry 10/22/19   Brunetta Jeans, PA-C  furosemide (LASIX) 40 MG tablet Take 1 tablet (40 mg total) by mouth as needed. 08/09/19   Brunetta Jeans, PA-C  glucose blood [facility] ULTRA) test strip Check blood sugars once daily  before breakfast (fasting) Dx: E11.9 08/15/19   Brunetta Jeans, PA-C  irbesartan (AVAPRO) 75 MG tablet Take 1 tablet (75 mg total) by mouth daily. 07/19/19   Jerline Pain, MD  Lancets (FREESTYLE) lancets Use as instructed 08/07/19   Brunetta Jeans, PA-C  Lurasidone HCl (LATUDA) 60 MG TABS Take 1 tablet (60 mg total) by mouth daily. 08/13/19   Brunetta Jeans, PA-C  Magnesium 500 MG CAPS Take 1 capsule by mouth daily.    [provider]  Multiple Vitamins-Minerals (MULTIVITAMIN WITH MINERALS) tablet Take 1 tablet by mouth daily.    [provider]  nitroGLYCERIN (NITROSTAT) 0.4 MG SL tablet Place 0.4 mg under the tongue every 5 (five) minutes as needed for chest pain.    [provider]  Omega 3 1000 MG CAPS Take 1 capsule by mouth daily.    [provider]  Potassium 99 MG TABS Take 1 tablet by  mouth daily.    [provider]  prazosin (MINIPRESS) 2 MG capsule Take 2 mg by mouth at bedtime. 12/07/17   [provider]  prazosin (MINIPRESS) 5 MG capsule 5 mg at bedtime.  01/03/18   [provider]  promethazine-dextromethorphan (PROMETHAZINE-DM) 6.25-15 MG/5ML syrup Take 5 mLs by mouth 4 (four) times daily as needed for cough. 08/20/19   Brunetta Jeans, PA-C  rosuvastatin (CRESTOR) 20 MG tablet Take 1 tablet (20 mg total) by mouth daily. 07/19/19   Jerline Pain, MD  triamcinolone (NASACORT) 55 MCG/ACT AERO nasal inhaler Place 2 sprays into the nose daily. 08/19/19   Brunetta Jeans, PA-C  Vitamin D, Ergocalciferol, (DRISDOL) 1.25 MG (50000 UNIT) CAPS capsule Take 1 capsule (50,000 Units total) by mouth every 7 (seven) days. 08/09/19   Brunetta Jeans, PA-C  colchicine 0.6 MG tablet Take 1 tablet (0.6 mg total) by mouth once for 1 dose. 02/21/19 04/30/19  Elnora Morrison, MD  gabapentin (NEURONTIN) 300 MG capsule Take 300 mg by mouth 2 (two) times daily.  04/30/19  [provider]  temazepam (RESTORIL) 30 MG capsule Take 30 mg by mouth at bedtime.  01/31/18 04/30/19  [provider]  VYVANSE 70 MG capsule Take 70 mg by mouth daily.  01/31/18 03/13/19  [provider]    Allergies    Latex, Levaquin [levofloxacin], Metoprolol, and Tape  Review of Systems   Review of Systems  Constitutional: Negative for chills and fever.  HENT: Negative for congestion.   Respiratory: Negative for shortness of breath.   Cardiovascular: Positive for leg swelling. Negative for chest pain.  Gastrointestinal: Negative for abdominal pain.  Genitourinary: Negative for dysuria and enuresis.  Musculoskeletal: Negative for back pain.  Skin: Positive for rash.  Neurological: Negative for headaches.  Hematological: Does not bruise/bleed easily.    Physical Exam Updated Vital Signs BP Marland Kitchen)  145/92 (BP Location: Right Arm)   Pulse 87   Temp 98.3 F (36.8 C)  (Oral)   Resp 20   Ht 5' 7" (1.702 m)   Wt 136.1 kg   SpO2 100%   BMI 46.99 kg/m   Physical Exam Vitals and nursing note reviewed.  Constitutional:      General: She is not in acute distress.    Appearance: She is not ill-appearing.  HENT:     Head: Normocephalic and atraumatic.     Nose: No congestion.  Eyes:     Conjunctiva/sclera: Conjunctivae normal.  Cardiovascular:     Rate and Rhythm: Normal rate and regular rhythm.     Pulses: Normal pulses.     Heart sounds: No murmur heard. No friction rub. No gallop.   Pulmonary:     Effort: No respiratory distress.     Breath sounds: No wheezing, rhonchi or rales.  Abdominal:     Palpations: Abdomen is soft.     Tenderness: There is no abdominal tenderness.  Musculoskeletal:     Right lower leg: Edema present.     Left lower leg: No edema.     Comments: Patient's left hand was visualized she has a noted blister that is distal to the PIP joint, it was not warm to the touch, no surrounding erythema present, tender to palpation, no surrounding fluctuant indurations present.  Patient has full range of motion all joints, neurovascularly intact.  Patient's lower extremities were visualized patient's right leg was notably larger than the right, 1+ edema, calf is nontender to palpation, no chronic lymphedema changes, neurovascular fully intact.  Skin:    General: Skin is warm and dry.  Neurological:     Mental Status: She is alert.  Psychiatric:        Mood and Affect: Mood normal.         ED Results / Procedures / Treatments   Labs (all labs ordered are listed, but only abnormal results are displayed) Labs Reviewed - No data to display  EKG None  Radiology No results found.  Procedures Procedures   Medications Ordered in ED Medications - No data to display  ED Course  I have reviewed the triage vital signs and the nursing notes.  Pertinent labs & imaging results that were available during my care of the patient  were reviewed by me and considered in my medical decision making (see chart for details).    MDM Rules/Calculators/A&P                         Initial impression-patient presents with blister of her left index finger, medication refill.  She is alert, does not appear in acute distress, vital signs notable for hypertension.  Work-up-due to well-appearing patient, benign physical exam, further lab work and imaging are not warranted at this time.  Rule out-I have low suspicion for overlying cellulitis or deep tissue infection of the left index finger as there is no surrounding erythema, no fluctuant or indurations present.  Suspect patient suffering from a reaction from the bug bite.  Low suspicion for PE as patient denies chest pain, shortness of breath, vital signs are reassuring.  Low suspicion for DVT of the right lower leg is patient is known to have chronic lymphedema changes in that leg, she denies calf pain, recent trauma, recent travels, states that this happened after she stopped her Lasix.  Cannot exclude the possibility of DVT,  unfortunately we do  not have ultrasound at this time will schedule ultrasound for tomorrow for further evaluation.  With shared decision making will defer anticoagulants at this time as there is a very low chance of DVT and risks outweigh the benefits.  Plan-  1.  Blister on index finger-suspect secondary due to a bug bite, will defer antibiotics as no surrounding erythema or infection, will recommend basic wound care follow-up with PCP if needed.  2.  Right leg swelling-suspect secondary due to not taking her Lasix, but cannot exclude the possibility of DVT, she is scheduled for outpatient DVT study tomorrow for further evaluation.  3.  Medication refill since resolved- refill patient's hypertension medication and Lasix, will  referred to community health and wellness to find a PCP.  Vital signs have remained stable, no indication for hospital admission.  Patient  discussed with attending and they agreed with assessment and plan.  Patient given at home care as well strict return precautions.  Patient verbalized that they understood agreed to said plan.   Final Clinical Impression(s) / ED Diagnoses Final diagnoses:  Insect bite of left index finger, initial encounter  Pedal edema  Medication refill    Rx / DC Orders ED Discharge Orders         Ordered    furosemide (LASIX) 40 MG tablet  Daily PRN        10/31/20 1633    irbesartan (AVAPRO) 75 MG tablet  Daily        10/31/20 1633    US Venous Img Lower Unilateral Right        10/31/20 1633           Marcello Fennel, PA-C 10/31/20 1731    Long, [facility], MD 11/01/20 1530

## 2020-10-31 NOTE — ED Triage Notes (Signed)
Pt to er, pt c/o spider bite, pt has a blister to her L pointer finger.  States that it started around noon yesterday, states that she saw two puncture wounds, then the blister kept getting bigger and bigger.

## 2020-11-09 DIAGNOSIS — J449 Chronic obstructive pulmonary disease, unspecified: Secondary | ICD-10-CM | POA: Diagnosis not present

## 2020-11-23 DIAGNOSIS — Z1159 Encounter for screening for other viral diseases: Secondary | ICD-10-CM | POA: Diagnosis not present

## 2020-11-23 DIAGNOSIS — Z0000 Encounter for general adult medical examination without abnormal findings: Secondary | ICD-10-CM | POA: Diagnosis not present

## 2020-11-23 DIAGNOSIS — Z79899 Other long term (current) drug therapy: Secondary | ICD-10-CM | POA: Diagnosis not present

## 2020-11-23 DIAGNOSIS — E7800 Pure hypercholesterolemia, unspecified: Secondary | ICD-10-CM | POA: Diagnosis not present

## 2020-11-23 DIAGNOSIS — E559 Vitamin D deficiency, unspecified: Secondary | ICD-10-CM | POA: Diagnosis not present

## 2020-11-23 DIAGNOSIS — R0602 Shortness of breath: Secondary | ICD-10-CM | POA: Diagnosis not present

## 2020-11-23 DIAGNOSIS — R5383 Other fatigue: Secondary | ICD-10-CM | POA: Diagnosis not present

## 2020-11-23 DIAGNOSIS — F17210 Nicotine dependence, cigarettes, uncomplicated: Secondary | ICD-10-CM | POA: Diagnosis not present

## 2020-11-23 DIAGNOSIS — E119 Type 2 diabetes mellitus without complications: Secondary | ICD-10-CM | POA: Diagnosis not present

## 2020-12-01 DIAGNOSIS — M79606 Pain in leg, unspecified: Secondary | ICD-10-CM | POA: Diagnosis not present

## 2020-12-01 DIAGNOSIS — R600 Localized edema: Secondary | ICD-10-CM | POA: Diagnosis not present

## 2020-12-01 DIAGNOSIS — I2510 Atherosclerotic heart disease of native coronary artery without angina pectoris: Secondary | ICD-10-CM | POA: Diagnosis not present

## 2020-12-01 DIAGNOSIS — I509 Heart failure, unspecified: Secondary | ICD-10-CM | POA: Diagnosis not present

## 2020-12-08 DIAGNOSIS — E559 Vitamin D deficiency, unspecified: Secondary | ICD-10-CM | POA: Diagnosis not present

## 2020-12-08 DIAGNOSIS — I739 Peripheral vascular disease, unspecified: Secondary | ICD-10-CM | POA: Diagnosis not present

## 2020-12-08 DIAGNOSIS — Z9181 History of falling: Secondary | ICD-10-CM | POA: Diagnosis not present

## 2020-12-08 DIAGNOSIS — E119 Type 2 diabetes mellitus without complications: Secondary | ICD-10-CM | POA: Diagnosis not present

## 2020-12-08 DIAGNOSIS — E782 Mixed hyperlipidemia: Secondary | ICD-10-CM | POA: Diagnosis not present

## 2020-12-08 DIAGNOSIS — F17210 Nicotine dependence, cigarettes, uncomplicated: Secondary | ICD-10-CM | POA: Diagnosis not present

## 2020-12-10 DIAGNOSIS — J449 Chronic obstructive pulmonary disease, unspecified: Secondary | ICD-10-CM | POA: Diagnosis not present

## 2020-12-18 ENCOUNTER — Other Ambulatory Visit: Payer: Self-pay | Admitting: Family Medicine

## 2020-12-18 DIAGNOSIS — I739 Peripheral vascular disease, unspecified: Secondary | ICD-10-CM

## 2020-12-18 DIAGNOSIS — I70209 Unspecified atherosclerosis of native arteries of extremities, unspecified extremity: Secondary | ICD-10-CM

## 2021-01-09 DIAGNOSIS — J449 Chronic obstructive pulmonary disease, unspecified: Secondary | ICD-10-CM | POA: Diagnosis not present

## 2021-01-11 IMAGING — CR CHEST - 2 VIEW
2 series · 2 of 2 positions shown · non-contrast
Comparison: 03/14/2017

CLINICAL DATA: Chest pain

EXAM:
CHEST - 2 VIEW

[chest pa]
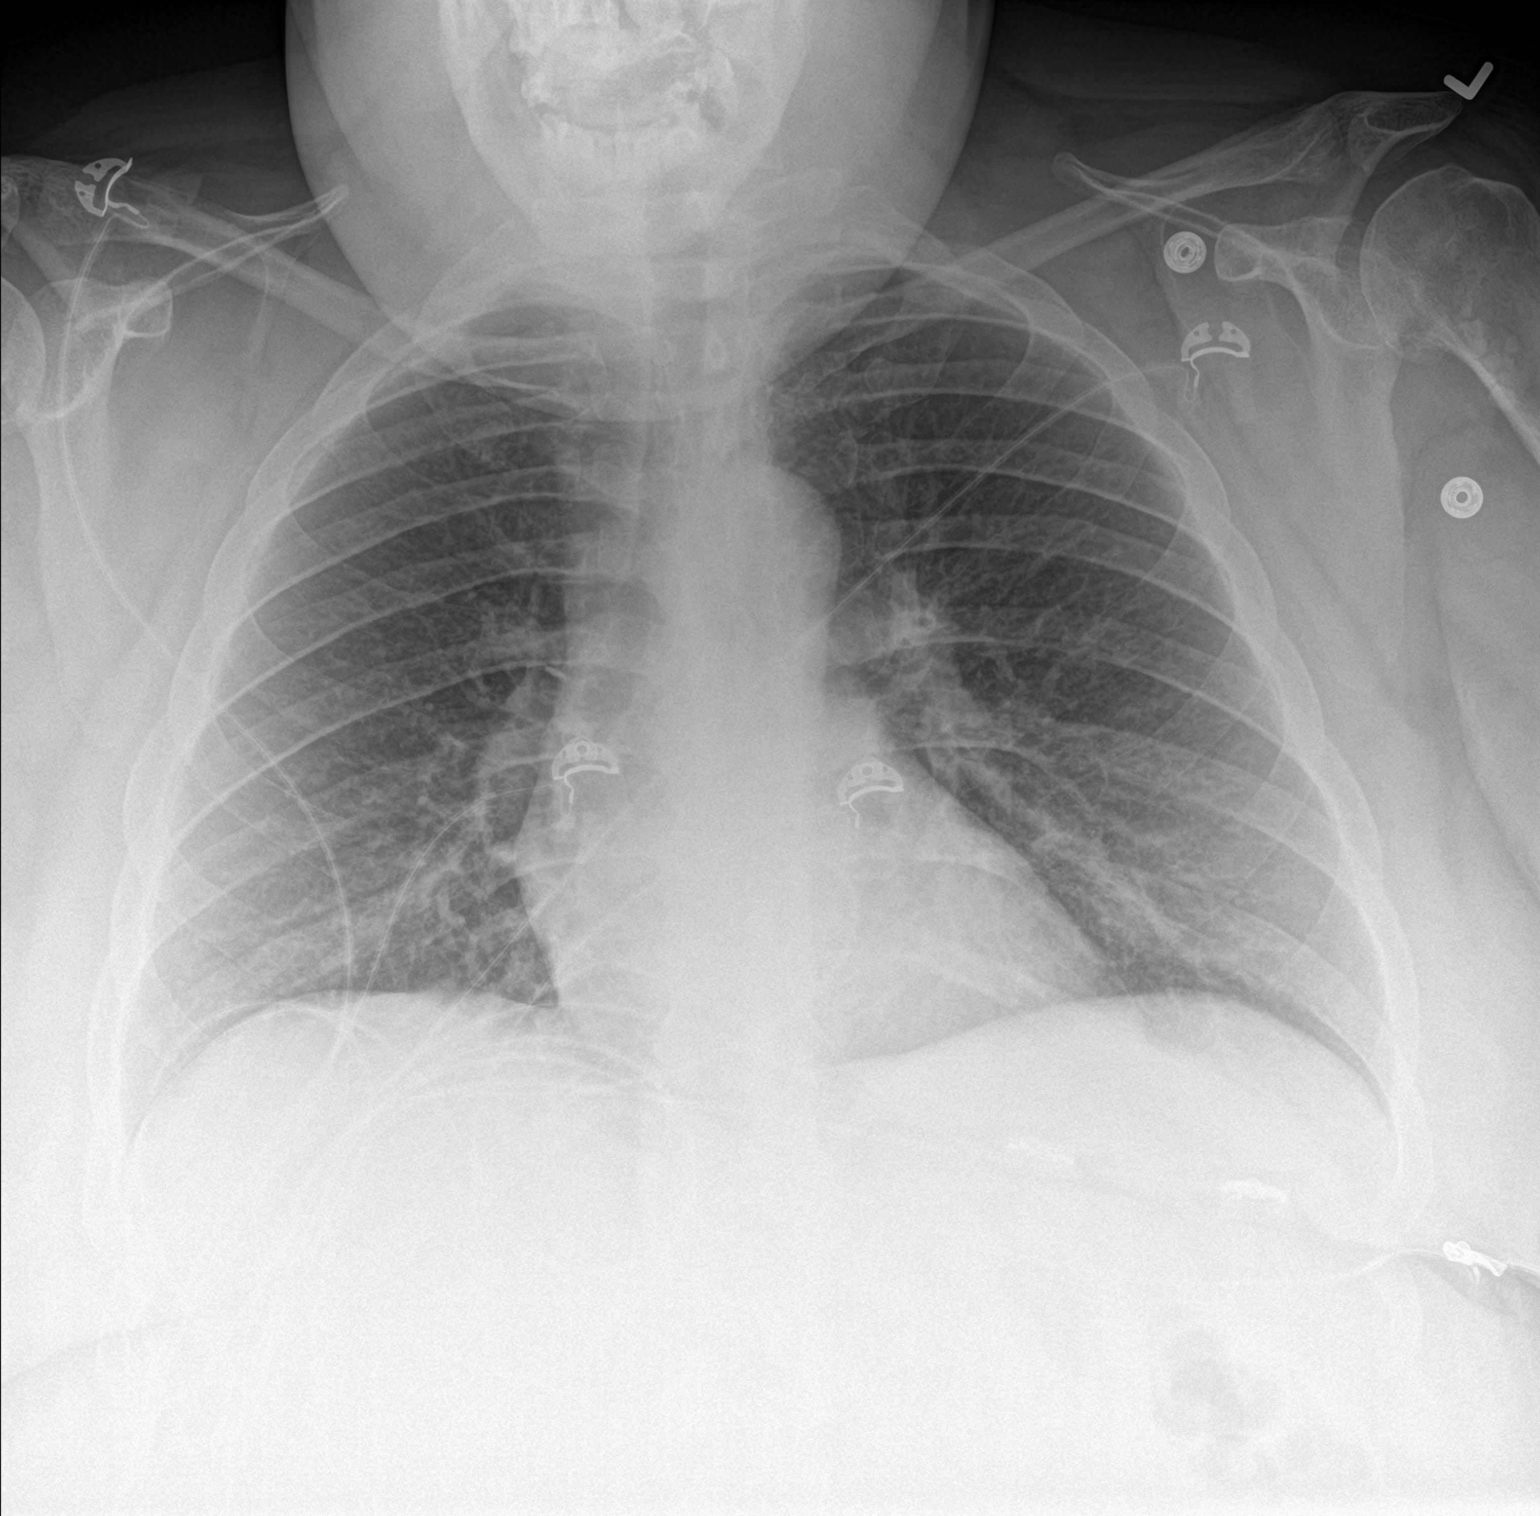

[chest lat]
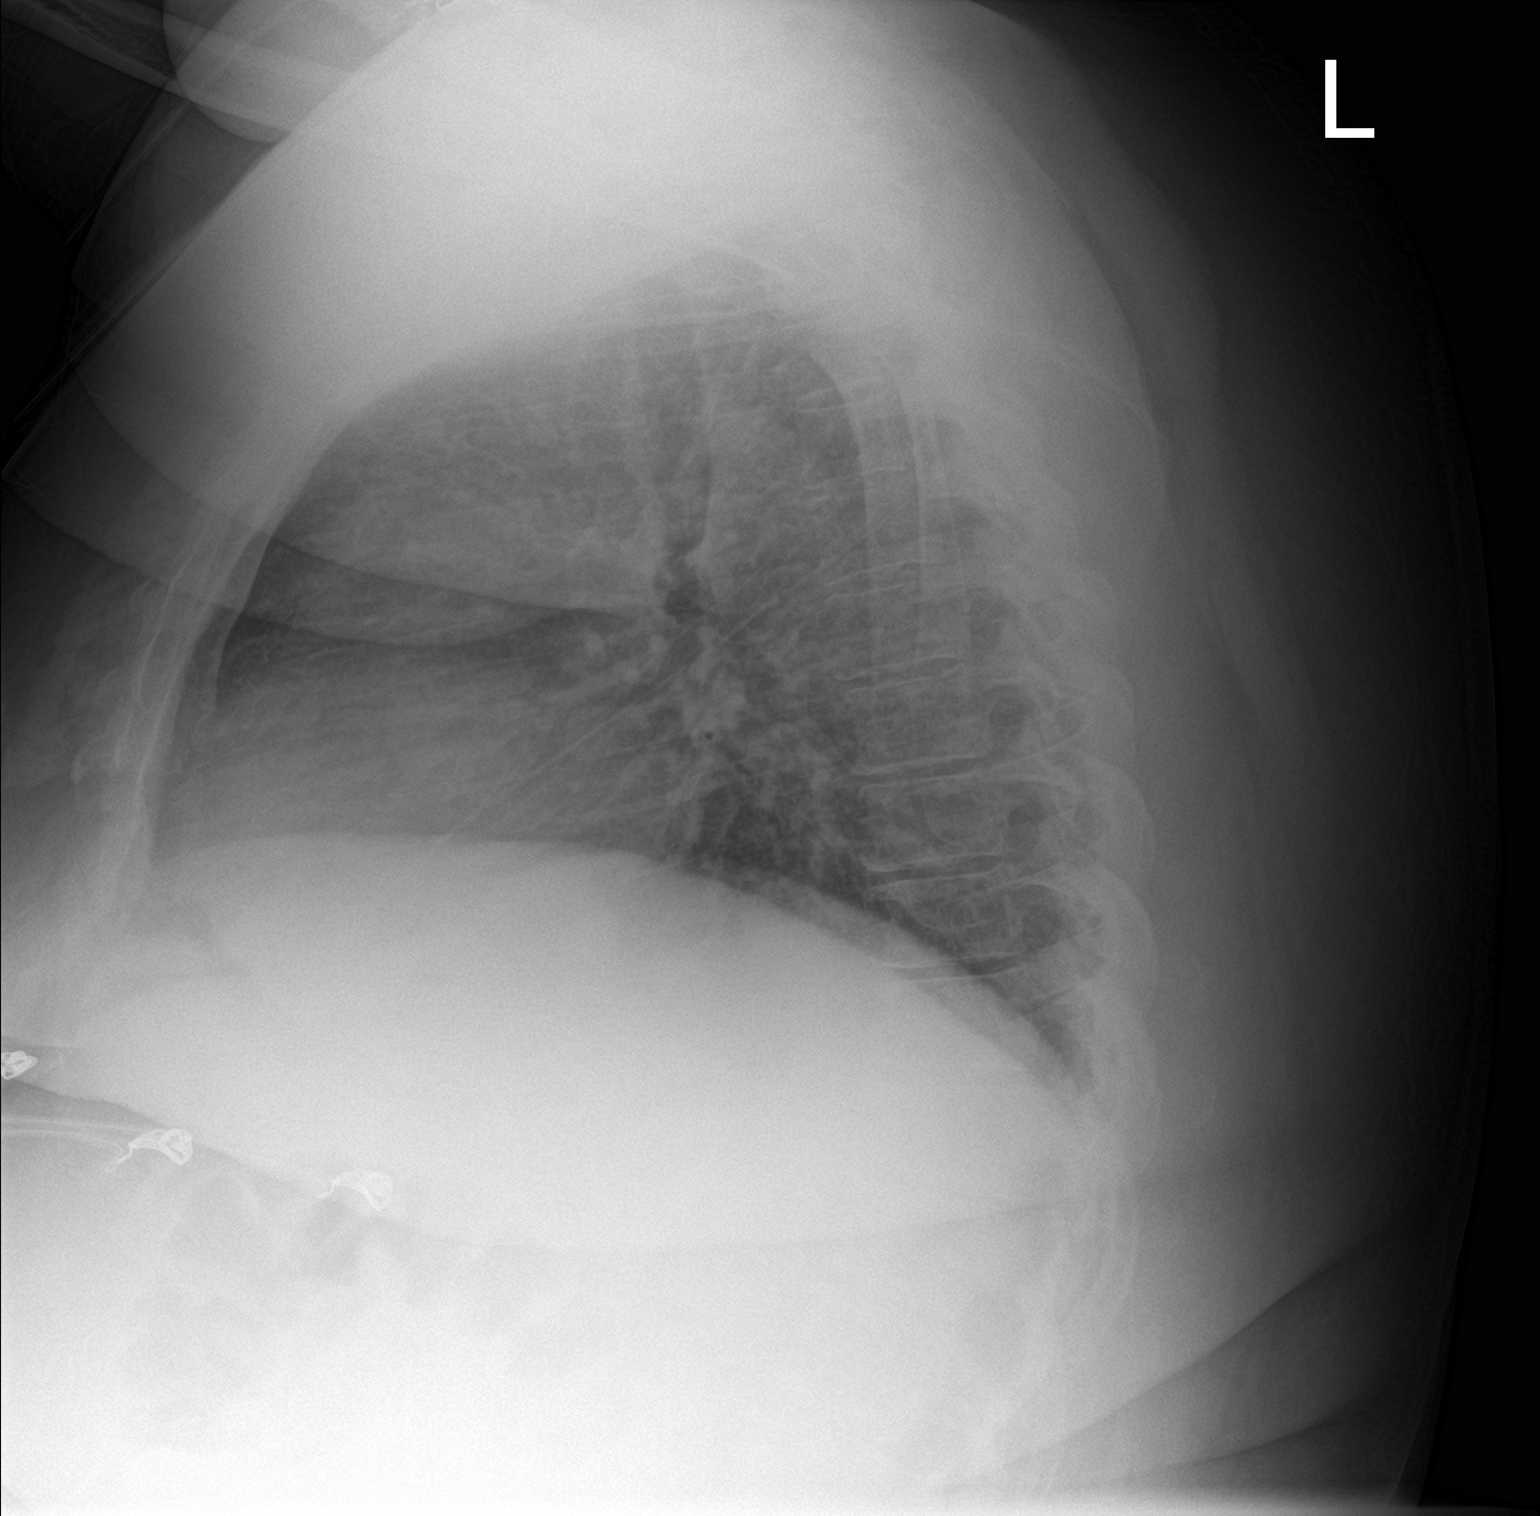

[2 of 2 positions shown; findings below may reference images not displayed]

FINDINGS: Normal heart size. Lungs are under aerated and grossly clear. No
pneumothorax. No pleural effusion.
IMPRESSION: No active cardiopulmonary disease.

## 2021-01-19 ENCOUNTER — Ambulatory Visit
Admission: RE | Admit: 2021-01-19 | Discharge: 2021-01-19 | Disposition: A | Discharge status: Home / Self Care | Payer: Medicare Other | Source: Ambulatory Visit | Attending: Family Medicine | Admitting: Family Medicine

## 2021-01-19 ENCOUNTER — Other Ambulatory Visit: Payer: Self-pay

## 2021-01-19 DIAGNOSIS — M7989 Other specified soft tissue disorders: Secondary | ICD-10-CM | POA: Diagnosis not present

## 2021-01-19 DIAGNOSIS — K5730 Diverticulosis of large intestine without perforation or abscess without bleeding: Secondary | ICD-10-CM | POA: Diagnosis not present

## 2021-01-19 DIAGNOSIS — Z87891 Personal history of nicotine dependence: Secondary | ICD-10-CM | POA: Diagnosis not present

## 2021-01-19 DIAGNOSIS — M170 Bilateral primary osteoarthritis of knee: Secondary | ICD-10-CM | POA: Diagnosis not present

## 2021-01-19 DIAGNOSIS — I739 Peripheral vascular disease, unspecified: Secondary | ICD-10-CM

## 2021-01-19 MED ORDER — IOPAMIDOL (ISOVUE-370) INJECTION 76%
125.0000 mL | Freq: Once | INTRAVENOUS | Status: AC | PRN
  Administered 2021-01-19: 125 mL via INTRAVENOUS

## 2021-01-21 DIAGNOSIS — Z79899 Other long term (current) drug therapy: Secondary | ICD-10-CM | POA: Diagnosis not present

## 2021-01-21 DIAGNOSIS — F17200 Nicotine dependence, unspecified, uncomplicated: Secondary | ICD-10-CM | POA: Diagnosis not present

## 2021-01-21 DIAGNOSIS — Z008 Encounter for other general examination: Secondary | ICD-10-CM | POA: Diagnosis not present

## 2021-01-21 DIAGNOSIS — Z634 Disappearance and death of family member: Secondary | ICD-10-CM | POA: Diagnosis not present

## 2021-01-26 ENCOUNTER — Other Ambulatory Visit: Payer: Self-pay | Admitting: Family Medicine

## 2021-01-26 DIAGNOSIS — Z9181 History of falling: Secondary | ICD-10-CM | POA: Diagnosis not present

## 2021-01-26 DIAGNOSIS — K760 Fatty (change of) liver, not elsewhere classified: Secondary | ICD-10-CM | POA: Diagnosis not present

## 2021-01-26 DIAGNOSIS — I700 Atherosclerosis of aorta: Secondary | ICD-10-CM | POA: Diagnosis not present

## 2021-01-26 DIAGNOSIS — E782 Mixed hyperlipidemia: Secondary | ICD-10-CM | POA: Diagnosis not present

## 2021-01-26 DIAGNOSIS — E119 Type 2 diabetes mellitus without complications: Secondary | ICD-10-CM | POA: Diagnosis not present

## 2021-01-26 DIAGNOSIS — F17210 Nicotine dependence, cigarettes, uncomplicated: Secondary | ICD-10-CM | POA: Diagnosis not present

## 2021-02-09 DIAGNOSIS — J449 Chronic obstructive pulmonary disease, unspecified: Secondary | ICD-10-CM | POA: Diagnosis not present

## 2021-02-16 ENCOUNTER — Ambulatory Visit (INDEPENDENT_AMBULATORY_CARE_PROVIDER_SITE_OTHER): Payer: Medicare Other | Admitting: Vascular Surgery

## 2021-02-16 ENCOUNTER — Encounter: Payer: Self-pay | Admitting: Vascular Surgery

## 2021-02-16 ENCOUNTER — Other Ambulatory Visit: Payer: Self-pay

## 2021-02-16 DIAGNOSIS — I739 Peripheral vascular disease, unspecified: Secondary | ICD-10-CM | POA: Insufficient documentation

## 2021-02-16 NOTE — Progress Notes (Signed)
Patient name: Kaitlyn Chambers MRN: [id number] DOB: [birth year] Sex: female  REASON FOR VISIT: Evaluate aortoiliac stenosis  HPI: Kaitlyn Chambers is a 40 y.o. female with multiple medical problems that presents for evaluation of aortoiliac stenosis identified on CTA scan.  In talking with the patient sounds like she had a spider bite that prompted a visit to the ED where  she was noted to have worsening leg swelling.  She states ultimately through this work-up she got a CTA given evidence of cellulitis of the right leg.  She was then referred back to [place].  She does complain of some cramping in the right thigh.  No other active tissue loss.  Her legs remain swollen and she has been seen for venous disease in the past.  She is wearing compression stockings.  Past Medical History:  Diagnosis Date   Anxiety    Bipolar 1 disorder, manic, full remission (HCC)    CHF (congestive heart failure) (HCC)    COPD (chronic obstructive pulmonary disease) (HCC)    Coronary artery disease    Depression    GERD (gastroesophageal reflux disease)    Gout    High cholesterol    Hypertension    Hypothyroidism    Metabolic syndrome    NSTEMI (non-ST elevated myocardial infarction) ([place]) 07/01/2016   On home oxygen therapy    "available; don't use it" (07/01/2016)   PCOS (polycystic ovarian syndrome)    Stomach ulcer    Type 2 diabetes, diet controlled ([place])     Past Surgical History:  Procedure Laterality Date   CARDIAC CATHETERIZATION N/A 07/01/2016   Procedure: LEFT HEART CATH AND CORONARY ANGIOGRAPHY;  Surgeon: Burnell Blanks, MD;  Location: [facility];  Service: Cardiovascular;  Laterality: N/A;   CARDIAC CATHETERIZATION N/A 07/01/2016   Procedure: Coronary Stent Intervention;  Surgeon: Burnell Blanks, MD;  Location: [facility];  Service: Cardiovascular;  Laterality: N/A;   CARPAL TUNNEL RELEASE Bilateral    CERVICAL BIOPSY  W/ LOOP ELECTRODE EXCISION  05/2016    "precancerous cells"   CERVICAL BIOPSY  W/ LOOP ELECTRODE EXCISION     CORONARY ANGIOPLASTY WITH STENT PLACEMENT  07/01/2016   TONSILLECTOMY      Family History  Problem Relation Age of Onset   Diabetes Mother    Heart failure Mother    Heart attack Mother 12   Heart disease Mother    Alcohol abuse Mother    Arthritis Mother    COPD Mother    Depression Mother    Drug abuse Mother    Hyperlipidemia Mother    Hypertension Mother    Intellectual disability Mother    Healthy Son    Alcohol abuse Father    Arthritis Father    Depression Father    Intellectual disability Father    Early death Father    Alcohol abuse Sister    Arthritis Sister    Depression Sister    Intellectual disability Sister    Intellectual disability Maternal Grandmother    Hypertension Maternal Grandmother    Hyperlipidemia Maternal Grandmother    Heart disease Maternal Grandmother    Diabetes Maternal Grandmother    Depression Maternal Grandmother    COPD Maternal Grandmother    Alcohol abuse Maternal Grandfather    Arthritis Maternal Grandfather    COPD Maternal Grandfather    Diabetes Maternal Grandfather    Heart attack Maternal Grandfather    Heart disease Maternal Grandfather    Hyperlipidemia  Maternal Grandfather    Hypertension Maternal Grandfather    Intellectual disability Maternal Grandfather    Arthritis Sister     SOCIAL HISTORY: Social History   Tobacco Use   Smoking status: Every Day    Packs/day: 0.25    Years: 26.00    Pack years: 6.50    Types: Cigarettes   Smokeless tobacco: Never  Substance Use Topics   Alcohol use: No    Allergies  Allergen Reactions   Latex Swelling   Levaquin [Levofloxacin] Rash   Metoprolol Nausea And Vomiting and Other (See Comments)    Sweats and dizziness   Tape Rash    Current Outpatient Medications  Medication Sig Dispense Refill   albuterol (PROVENTIL) (2.5 MG/3ML) 0.083% nebulizer solution Take 3 mLs (2.5 mg total) by  nebulization every 6 (six) hours as needed for wheezing or shortness of breath. 75 mL 12   albuterol (VENTOLIN HFA) 108 (90 Base) MCG/ACT inhaler Inhale 1-2 puffs into the lungs every 6 (six) hours as needed for wheezing or shortness of breath. 1 Inhaler 3   ALPRAZolam (XANAX) 0.5 MG tablet Take by mouth.     aspirin EC 81 MG EC tablet Take 1 tablet (81 mg total) by mouth daily. 30 tablet 0   Blood Glucose Monitoring Suppl (ONE TOUCH ULTRA 2) w/Device KIT 1 kit by Does not apply route daily. 1 kit 0   Coenzyme Q10 (COQ10) 100 MG CAPS Take 1 capsule by mouth daily.     empagliflozin (JARDIANCE) 10 MG TABS tablet Take 1 tablet (10 mg total) by mouth daily before breakfast. No further refills without follow-up. 30 tablet 0   fenofibrate (TRICOR) 145 MG tablet Take 1 tablet (145 mg total) by mouth daily. 90 tablet 3   FLUoxetine (PROZAC) 40 MG capsule Take 1 capsule (40 mg total) by mouth at bedtime. Further refills will come from Psychiatry 30 capsule 0   furosemide (LASIX) 40 MG tablet Take 1 tablet (40 mg total) by mouth as needed. 90 tablet 1   glucose blood (ONETOUCH ULTRA) test strip Check blood sugars once daily  before breakfast (fasting) Dx: E11.9 100 each 12   irbesartan (AVAPRO) 75 MG tablet Take 1 tablet (75 mg total) by mouth daily. 90 tablet 3   Lancets (FREESTYLE) lancets Use as instructed 100 each 12   Lurasidone HCl (LATUDA) 60 MG TABS Take 1 tablet (60 mg total) by mouth daily. 90 tablet 1   Magnesium 500 MG CAPS Take 1 capsule by mouth daily.     Multiple Vitamins-Minerals (MULTIVITAMIN WITH MINERALS) tablet Take 1 tablet by mouth daily.     nitroGLYCERIN (NITROSTAT) 0.4 MG SL tablet Place 0.4 mg under the tongue every 5 (five) minutes as needed for chest pain.     OZEMPIC, 0.25 OR 0.5 MG/DOSE, 2 MG/1.5ML SOPN Inject into the skin.     Potassium 99 MG TABS Take 1 tablet by mouth daily.     prazosin (MINIPRESS) 5 MG capsule 5 mg at bedtime.   0   rosuvastatin (CRESTOR) 20 MG tablet  Take 1 tablet (20 mg total) by mouth daily. 90 tablet 3   ascorbic acid (VITAMIN C) 500 MG tablet Take 500 mg by mouth daily. (Patient not taking: Reported on 02/16/2021)     furosemide (LASIX) 40 MG tablet Take 1 tablet (40 mg total) by mouth daily as needed. 30 tablet 0   irbesartan (AVAPRO) 75 MG tablet Take 1 tablet (75 mg total) by mouth daily. 30 tablet  0   Omega 3 1000 MG CAPS Take 1 capsule by mouth daily.     prazosin (MINIPRESS) 2 MG capsule Take 2 mg by mouth at bedtime.  0   promethazine-dextromethorphan (PROMETHAZINE-DM) 6.25-15 MG/5ML syrup Take 5 mLs by mouth 4 (four) times daily as needed for cough. 118 mL 0   triamcinolone (NASACORT) 55 MCG/ACT AERO nasal inhaler Place 2 sprays into the nose daily. 1 Inhaler 12   Vitamin D, Ergocalciferol, (DRISDOL) 1.25 MG (50000 UNIT) CAPS capsule Take 1 capsule (50,000 Units total) by mouth every 7 (seven) days. 4 capsule 2   No current facility-administered medications for this visit.    REVIEW OF SYSTEMS:  '[X]'  denotes positive finding, '[ ]'  denotes negative finding Cardiac  Comments:  Chest pain or chest pressure:    Shortness of breath upon exertion:    Short of breath when lying flat:    Irregular heart rhythm:        Vascular    Pain in calf, thigh, or hip brought on by ambulation:    Pain in feet at night that wakes you up from your sleep:     Blood clot in your veins:    Leg swelling:         Pulmonary    Oxygen at home:    Productive cough:     Wheezing:         Neurologic    Sudden weakness in arms or legs:     Sudden numbness in arms or legs:     Sudden onset of difficulty speaking or slurred speech:    Temporary loss of vision in one eye:     Problems with dizziness:         Gastrointestinal    Blood in stool:     Vomited blood:         Genitourinary    Burning when urinating:     Blood in urine:        Psychiatric    Major depression:         Hematologic    Bleeding problems:    Problems with blood  clotting too easily:        Skin    Rashes or ulcers:        Constitutional    Fever or chills:      PHYSICAL EXAM: Vitals:   02/16/21 1451  BP: (!) 152/94  Pulse: 88  Resp: 16  Temp: 97.8 F (36.6 C)  TempSrc: Temporal  SpO2: 96%  Weight: (!) 302 lb (137 kg)  Height: '5\' 7"'  (1.702 m)    GENERAL: The patient is a well-nourished female, in no acute distress. The vital signs are documented above. CARDIAC: There is a regular rate and rhythm.  VASCULAR:  Femoral pulses are palpable bilaterally DP pulses are palpable bilaterally No active tissue loss PULMONARY: No respiratory distress ABDOMEN: Soft and non-tender MUSCULOSKELETAL: There are no major deformities or cyanosis. NEUROLOGIC: No focal weakness or paresthesias are detected. PSYCHIATRIC: The patient has a normal affect.  DATA:   CTA reviewed from 01/19/2021 and she has mild aortoiliac plaque with moderate popliteal stenosis bilaterally.  Assessment/Plan:  40 year old female referred back for evaluation of CTA obtained by her PCP to evaluate for aortoiliac stenosis.  I reviewed her CTA and she has really very mild aortoiliac calcification.  The most prominent pathology is some moderate popliteal stenosis bilaterally.  She has palpable dorsalis pedis pulses in the feet and this does not appear to  be flow-limiting lesions.  Her only complaint is some right thigh cramping that is intermittent and this would not be consistent with her popliteal stenosis with a palpable pedal pulse.  I discussed the importance of medical management with smoking cessation as well as aspirin statin which she is taking and walking therapy.  I will arrange follow-up in a year in the [facility] clinic with ABIs just so we can maintain surveillance given she is concerned about losing her legs after her mom required bilateral above-knee amputations  She is already in compression stockings for her underlying venous insufficiency.  I discussed the importance of  weight loss and exercise and leg elevation.  She was not a candidate for laser ablation given she had focal reflux at the saphenofemoral junction that was not long segment.  Medical management was recommended.   Marty Heck, MD [facility] Office: [phone]

## 2021-03-01 DIAGNOSIS — I2510 Atherosclerotic heart disease of native coronary artery without angina pectoris: Secondary | ICD-10-CM | POA: Diagnosis not present

## 2021-03-12 DIAGNOSIS — J449 Chronic obstructive pulmonary disease, unspecified: Secondary | ICD-10-CM | POA: Diagnosis not present

## 2021-03-29 DIAGNOSIS — E559 Vitamin D deficiency, unspecified: Secondary | ICD-10-CM | POA: Diagnosis not present

## 2021-03-29 DIAGNOSIS — I10 Essential (primary) hypertension: Secondary | ICD-10-CM | POA: Diagnosis not present

## 2021-03-29 DIAGNOSIS — E782 Mixed hyperlipidemia: Secondary | ICD-10-CM | POA: Diagnosis not present

## 2021-03-29 DIAGNOSIS — E039 Hypothyroidism, unspecified: Secondary | ICD-10-CM | POA: Diagnosis not present

## 2021-03-29 DIAGNOSIS — Z23 Encounter for immunization: Secondary | ICD-10-CM | POA: Diagnosis not present

## 2021-03-29 DIAGNOSIS — Z9181 History of falling: Secondary | ICD-10-CM | POA: Diagnosis not present

## 2021-03-29 DIAGNOSIS — Z1159 Encounter for screening for other viral diseases: Secondary | ICD-10-CM | POA: Diagnosis not present

## 2021-03-29 DIAGNOSIS — F17210 Nicotine dependence, cigarettes, uncomplicated: Secondary | ICD-10-CM | POA: Diagnosis not present

## 2021-03-29 DIAGNOSIS — E119 Type 2 diabetes mellitus without complications: Secondary | ICD-10-CM | POA: Diagnosis not present

## 2021-04-11 DIAGNOSIS — J449 Chronic obstructive pulmonary disease, unspecified: Secondary | ICD-10-CM | POA: Diagnosis not present

## 2021-04-26 DIAGNOSIS — R945 Abnormal results of liver function studies: Secondary | ICD-10-CM | POA: Diagnosis not present

## 2021-04-29 ENCOUNTER — Other Ambulatory Visit (HOSPITAL_COMMUNITY): Payer: Self-pay | Admitting: Gastroenterology

## 2021-04-29 DIAGNOSIS — R7989 Other specified abnormal findings of blood chemistry: Secondary | ICD-10-CM

## 2021-05-12 DIAGNOSIS — J449 Chronic obstructive pulmonary disease, unspecified: Secondary | ICD-10-CM | POA: Diagnosis not present

## 2021-05-20 ENCOUNTER — Other Ambulatory Visit: Payer: Self-pay | Admitting: Radiology

## 2021-05-21 ENCOUNTER — Other Ambulatory Visit: Payer: Self-pay

## 2021-05-21 ENCOUNTER — Ambulatory Visit (HOSPITAL_COMMUNITY)
Admission: RE | Admit: 2021-05-21 | Discharge: 2021-05-21 | Disposition: A | Discharge status: Home / Self Care | Payer: Medicare Other | Source: Ambulatory Visit | Attending: Gastroenterology | Admitting: Gastroenterology

## 2021-05-21 DIAGNOSIS — E7800 Pure hypercholesterolemia, unspecified: Secondary | ICD-10-CM | POA: Diagnosis not present

## 2021-05-21 DIAGNOSIS — E669 Obesity, unspecified: Secondary | ICD-10-CM | POA: Insufficient documentation

## 2021-05-21 DIAGNOSIS — F319 Bipolar disorder, unspecified: Secondary | ICD-10-CM | POA: Diagnosis not present

## 2021-05-21 DIAGNOSIS — Z8711 Personal history of peptic ulcer disease: Secondary | ICD-10-CM | POA: Insufficient documentation

## 2021-05-21 DIAGNOSIS — I252 Old myocardial infarction: Secondary | ICD-10-CM | POA: Diagnosis not present

## 2021-05-21 DIAGNOSIS — R945 Abnormal results of liver function studies: Secondary | ICD-10-CM | POA: Diagnosis not present

## 2021-05-21 DIAGNOSIS — I110 Hypertensive heart disease with heart failure: Secondary | ICD-10-CM | POA: Diagnosis not present

## 2021-05-21 DIAGNOSIS — E282 Polycystic ovarian syndrome: Secondary | ICD-10-CM | POA: Diagnosis not present

## 2021-05-21 DIAGNOSIS — I2510 Atherosclerotic heart disease of native coronary artery without angina pectoris: Secondary | ICD-10-CM | POA: Insufficient documentation

## 2021-05-21 DIAGNOSIS — K7581 Nonalcoholic steatohepatitis (NASH): Secondary | ICD-10-CM | POA: Insufficient documentation

## 2021-05-21 DIAGNOSIS — J449 Chronic obstructive pulmonary disease, unspecified: Secondary | ICD-10-CM | POA: Diagnosis not present

## 2021-05-21 DIAGNOSIS — F17210 Nicotine dependence, cigarettes, uncomplicated: Secondary | ICD-10-CM | POA: Diagnosis not present

## 2021-05-21 DIAGNOSIS — R7989 Other specified abnormal findings of blood chemistry: Secondary | ICD-10-CM | POA: Diagnosis present

## 2021-05-21 DIAGNOSIS — E039 Hypothyroidism, unspecified: Secondary | ICD-10-CM | POA: Insufficient documentation

## 2021-05-21 DIAGNOSIS — M109 Gout, unspecified: Secondary | ICD-10-CM | POA: Insufficient documentation

## 2021-05-21 DIAGNOSIS — I509 Heart failure, unspecified: Secondary | ICD-10-CM | POA: Diagnosis not present

## 2021-05-21 DIAGNOSIS — K219 Gastro-esophageal reflux disease without esophagitis: Secondary | ICD-10-CM | POA: Diagnosis not present

## 2021-05-21 DIAGNOSIS — E119 Type 2 diabetes mellitus without complications: Secondary | ICD-10-CM | POA: Insufficient documentation

## 2021-05-21 DIAGNOSIS — F419 Anxiety disorder, unspecified: Secondary | ICD-10-CM | POA: Diagnosis not present

## 2021-05-21 DIAGNOSIS — Z955 Presence of coronary angioplasty implant and graft: Secondary | ICD-10-CM | POA: Insufficient documentation

## 2021-05-21 LAB — CBC
HCT: 42.7 % (ref 36.0–46.0)
Hemoglobin: 14.4 g/dL (ref 12.0–15.0)
MCH: 30.3 pg (ref 26.0–34.0)
MCHC: 33.7 g/dL (ref 30.0–36.0)
MCV: 89.7 fL (ref 80.0–100.0)
Platelets: 237 10*3/uL (ref 150–400)
RBC: 4.76 MIL/uL (ref 3.87–5.11)
RDW: 12.3 % (ref 11.5–15.5)
WBC: 9.8 10*3/uL (ref 4.0–10.5)
nRBC: 0 % (ref 0.0–0.2)

## 2021-05-21 LAB — PROTIME-INR
INR: 1 (ref 0.8–1.2)
Prothrombin Time: 13.5 seconds (ref 11.4–15.2)

## 2021-05-21 LAB — GLUCOSE, CAPILLARY: Glucose-Capillary: 127 mg/dL — ABNORMAL HIGH (ref 70–99)

## 2021-05-21 LAB — PREGNANCY, URINE: Preg Test, Ur: NEGATIVE

## 2021-05-21 MED ORDER — MIDAZOLAM HCL 2 MG/2ML IJ SOLN
INTRAMUSCULAR | Status: AC
  Filled 2021-05-21: qty 2

## 2021-05-21 MED ORDER — FENTANYL CITRATE (PF) 100 MCG/2ML IJ SOLN
INTRAMUSCULAR | Status: AC | PRN
  Administered 2021-05-21: 50 ug via INTRAVENOUS

## 2021-05-21 MED ORDER — LIDOCAINE HCL (PF) 1 % IJ SOLN
INTRAMUSCULAR | Status: AC
  Filled 2021-05-21: qty 30

## 2021-05-21 MED ORDER — FENTANYL CITRATE (PF) 100 MCG/2ML IJ SOLN
INTRAMUSCULAR | Status: AC
  Filled 2021-05-21: qty 2

## 2021-05-21 MED ORDER — GELATIN ABSORBABLE 12-7 MM EX MISC
CUTANEOUS | Status: AC
  Filled 2021-05-21: qty 1

## 2021-05-21 MED ORDER — SODIUM CHLORIDE 0.9 % IV SOLN
INTRAVENOUS | Status: AC | PRN
  Administered 2021-05-21: 10 mL/h via INTRAVENOUS

## 2021-05-21 MED ORDER — SODIUM CHLORIDE 0.9 % IV SOLN: INTRAVENOUS | Status: DC

## 2021-05-21 MED ORDER — MIDAZOLAM HCL 2 MG/2ML IJ SOLN
INTRAMUSCULAR | Status: AC | PRN
  Administered 2021-05-21: 1 mg via INTRAVENOUS

## 2021-05-21 NOTE — Consult Note (Signed)
Chief Complaint: Patient was seen in consultation today for  image guided random core liver biopsy  Referring Physician(s): [email or web address]  Supervising Physician: Juliet Rude  Patient Status: [facility] - Out-pt  History of Present Illness: Kaitlyn Chambers is a 40 y.o. female smoker with PMH sig for anxiety/depression, obesity, bipolar d/o, CHF, COPD, CAD with prior stenting/NSTEMI,GERD, gout, HTN, hypothyroidism, hypercholesterolemia, PCOS, DM, peptic ulcer, diverticulosis and fatty liver by imaging. She has elevated LFT's and presents today for image guided random core liver biopsy for further evaluation.   Past Medical History:  Diagnosis Date   Anxiety    Bipolar 1 disorder, manic, full remission (HCC)    CHF (congestive heart failure) (HCC)    COPD (chronic obstructive pulmonary disease) (HCC)    Coronary artery disease    Depression    GERD (gastroesophageal reflux disease)    Gout    High cholesterol    Hypertension    Hypothyroidism    Metabolic syndrome    NSTEMI (non-ST elevated myocardial infarction) ([place]) 07/01/2016   On home oxygen therapy    "available; don't use it" (07/01/2016)   PCOS (polycystic ovarian syndrome)    Stomach ulcer    Type 2 diabetes, diet controlled ([place])     Past Surgical History:  Procedure Laterality Date   CARDIAC CATHETERIZATION N/A 07/01/2016   Procedure: LEFT HEART CATH AND CORONARY ANGIOGRAPHY;  Surgeon: Burnell Blanks, MD;  Location: [facility];  Service: Cardiovascular;  Laterality: N/A;   CARDIAC CATHETERIZATION N/A 07/01/2016   Procedure: Coronary Stent Intervention;  Surgeon: Burnell Blanks, MD;  Location: [facility];  Service: Cardiovascular;  Laterality: N/A;   CARPAL TUNNEL RELEASE Bilateral    CERVICAL BIOPSY  W/ LOOP ELECTRODE EXCISION  05/2016   "precancerous cells"   CERVICAL BIOPSY  W/ LOOP ELECTRODE EXCISION     CORONARY ANGIOPLASTY WITH STENT PLACEMENT  07/01/2016   TONSILLECTOMY       Allergies: Latex, Levaquin [levofloxacin], Metoprolol, and Tape  Medications: Prior to Admission medications   Medication Sig Start Date End Date Taking? Authorizing Provider  atorvastatin (LIPITOR) 80 MG tablet Take 80 mg by mouth daily.   Yes [provider]  empagliflozin (JARDIANCE) 25 MG TABS tablet Take 25 mg by mouth daily.   Yes [provider]  fenofibrate (TRICOR) 145 MG tablet Take 1 tablet (145 mg total) by mouth daily. 07/19/19  Yes Jerline Pain, MD  FLUoxetine (PROZAC) 40 MG capsule Take 1 capsule (40 mg total) by mouth at bedtime. Further refills will come from Psychiatry 10/22/19  Yes Brunetta Jeans, PA-C  furosemide (LASIX) 40 MG tablet Take 1 tablet (40 mg total) by mouth as needed. Patient taking differently: Take 40 mg by mouth daily. 08/09/19  Yes Brunetta Jeans, PA-C  irbesartan (AVAPRO) 150 MG tablet Take 150 mg by mouth daily.   Yes [provider]  MAGNESIUM PO Take 1 tablet by mouth daily.   Yes [provider]  nitroGLYCERIN (NITROSTAT) 0.4 MG SL tablet Place 0.4 mg under the tongue every 5 (five) minutes as needed for chest pain.   Yes [provider]  potassium chloride SA (KLOR-CON) 20 MEQ tablet Take 20 mEq by mouth daily as needed (cramps).   Yes [provider]  prazosin (MINIPRESS) 1 MG capsule Take 2 mg by mouth at bedtime.   Yes [provider]  Semaglutide, 1 MG/DOSE, (OZEMPIC, 1 MG/DOSE,) 2 MG/1.5ML SOPN Inject 1 mg into the  skin every Tuesday.   Yes [provider]  Simethicone (GAS-X PO) Take 2 tablets by mouth daily as needed (gas).   Yes [provider]  aspirin EC 81 MG EC tablet Take 1 tablet (81 mg total) by mouth daily. 07/04/16   Hongalgi, Lenis Dickinson, MD  Blood Glucose Monitoring Suppl (ONE TOUCH ULTRA 2) w/Device KIT 1 kit by Does not apply route daily. 08/09/19   Brunetta Jeans, PA-C  glucose blood [facility] ULTRA) test strip Check blood sugars once daily   before breakfast (fasting) Dx: E11.9 08/15/19   Brunetta Jeans, PA-C  Lancets (FREESTYLE) lancets Use as instructed 08/07/19   Brunetta Jeans, PA-C  Lurasidone HCl (LATUDA) 60 MG TABS Take 1 tablet (60 mg total) by mouth daily. 08/13/19   Brunetta Jeans, PA-C  colchicine 0.6 MG tablet Take 1 tablet (0.6 mg total) by mouth once for 1 dose. 02/21/19 04/30/19  Elnora Morrison, MD  gabapentin (NEURONTIN) 300 MG capsule Take 300 mg by mouth 2 (two) times daily.  04/30/19  [provider]  temazepam (RESTORIL) 30 MG capsule Take 30 mg by mouth at bedtime.  01/31/18 04/30/19  [provider]  VYVANSE 70 MG capsule Take 70 mg by mouth daily.  01/31/18 03/13/19  [provider]     Family History  Problem Relation Age of Onset   Diabetes Mother    Heart failure Mother    Heart attack Mother 67   Heart disease Mother    Alcohol abuse Mother    Arthritis Mother    COPD Mother    Depression Mother    Drug abuse Mother    Hyperlipidemia Mother    Hypertension Mother    Intellectual disability Mother    Healthy Son    Alcohol abuse Father    Arthritis Father    Depression Father    Intellectual disability Father    Early death Father    Alcohol abuse Sister    Arthritis Sister    Depression Sister    Intellectual disability Sister    Intellectual disability Maternal Grandmother    Hypertension Maternal Grandmother    Hyperlipidemia Maternal Grandmother    Heart disease Maternal Grandmother    Diabetes Maternal Grandmother    Depression Maternal Grandmother    COPD Maternal Grandmother    Alcohol abuse Maternal Grandfather    Arthritis Maternal Grandfather    COPD Maternal Grandfather    Diabetes Maternal Grandfather    Heart attack Maternal Grandfather    Heart disease Maternal Grandfather    Hyperlipidemia Maternal Grandfather    Hypertension Maternal Grandfather    Intellectual disability Maternal Grandfather    Arthritis Sister     Social History    Socioeconomic History   Marital status: Divorced    Spouse name: Not on file   Number of children: Not on file   Years of education: Not on file   Highest education level: Not on file  Occupational History   Not on file  Tobacco Use   Smoking status: Every Day    Packs/day: 0.25    Years: 26.00    Pack years: 6.50    Types: Cigarettes   Smokeless tobacco: Never  Vaping Use   Vaping Use: Never used  Substance and Sexual Activity   Alcohol use: No   Drug use: Not Currently    Types: Marijuana   Sexual activity: Yes    Birth control/protection: None  Other Topics Concern   Not  on file  Social History Narrative   Not on file   Social Determinants of Health   Financial Resource Strain: Not on file  Food Insecurity: Not on file  Transportation Needs: Not on file  Physical Activity: Not on file  Stress: Not on file  Social Connections: Not on file      Review of Systems denies fever, HA,CP, dyspnea, cough, abd/back pain,N/V or bleeding; she is anxious and has LE edema  Vital Signs: Vitals:   05/21/21 1141  BP: (!) 175/108  Pulse: 82  Resp: (!) 21  Temp: 97.9 F (36.6 C)  SpO2: 100%      Physical Exam awake/alert; chest- distant BS bilat; heart- RRR; abd- obese, soft,+BS,NT; bilat LE edema  Imaging: No results found.  Labs:  CBC: No results for input(s): WBC, HGB, HCT, PLT in the last 8760 hours.  COAGS: No results for input(s): INR, APTT in the last 8760 hours.  BMP: No results for input(s): NA, K, CL, CO2, GLUCOSE, BUN, CALCIUM, CREATININE, GFRNONAA, GFRAA in the last 8760 hours.  Invalid input(s): CMP  LIVER FUNCTION TESTS: No results for input(s): BILITOT, AST, ALT, ALKPHOS, PROT, ALBUMIN in the last 8760 hours.  TUMOR MARKERS: No results for input(s): AFPTM, CEA, CA199, CHROMGRNA in the last 8760 hours.  Assessment and Plan: 40 y.o. female smoker with PMH sig for anxiety/depression, obesity, bipolar d/o, CHF, COPD, CAD with prior  stenting/NSTEMI,GERD, gout, HTN, hypothyroidism, hypercholesterolemia, PCOS, DM, peptic ulcer, diverticulosis and fatty liver by imaging. She has elevated LFT's and presents today for image guided random core liver biopsy for further evaluation. Risks and benefits of procedure was discussed with the patient including, but not limited to bleeding, infection, damage to adjacent structures or low yield requiring additional tests.  All of the questions were answered and there is agreement to proceed.  Consent signed and in chart.    Thank you for this interesting consult.  I greatly enjoyed meeting Kaitlyn Chambers and look forward to participating in their care.  A copy of this report was sent to the requesting provider on this date.  Electronically Signed: D. Rowe Robert, PA-C 05/21/2021, 11:42 AM   I spent a total of  25 minutes   in face to face in clinical consultation, greater than 50% of which was counseling/coordinating care for image guided random core liver biopsy

## 2021-05-21 NOTE — Procedures (Signed)
Interventional Radiology Procedure Note  Date of Procedure: 05/21/2021  Procedure: US guided random liver biopsy   Findings:  1. US guided random liver biopsy, right lobe, 18ga x3 passes    Complications: No immediate complications noted.   Estimated Blood Loss: minimal  Follow-up and Recommendations: 1. Bedrest 4 hours with first 2 hours right side down    Albin Felling, MD  Vascular & Interventional Radiology  05/21/2021 2:22 PM

## 2021-05-21 NOTE — Sedation Documentation (Signed)
Patient on her right side for 2 hours as per verbal order Dr Denna Haggard.

## 2021-05-22 ENCOUNTER — Encounter (INDEPENDENT_AMBULATORY_CARE_PROVIDER_SITE_OTHER): Payer: Self-pay

## 2021-05-25 LAB — SURGICAL PATHOLOGY

## 2021-06-11 DIAGNOSIS — J449 Chronic obstructive pulmonary disease, unspecified: Secondary | ICD-10-CM | POA: Diagnosis not present

## 2021-07-12 DIAGNOSIS — J449 Chronic obstructive pulmonary disease, unspecified: Secondary | ICD-10-CM | POA: Diagnosis not present

## 2021-07-28 DIAGNOSIS — Z1211 Encounter for screening for malignant neoplasm of colon: Secondary | ICD-10-CM | POA: Diagnosis not present

## 2021-07-28 DIAGNOSIS — R110 Nausea: Secondary | ICD-10-CM | POA: Diagnosis not present

## 2021-07-28 DIAGNOSIS — K760 Fatty (change of) liver, not elsewhere classified: Secondary | ICD-10-CM | POA: Diagnosis not present

## 2021-08-10 DIAGNOSIS — R110 Nausea: Secondary | ICD-10-CM | POA: Diagnosis not present

## 2021-08-10 DIAGNOSIS — Z1211 Encounter for screening for malignant neoplasm of colon: Secondary | ICD-10-CM | POA: Diagnosis not present

## 2021-08-12 DIAGNOSIS — J449 Chronic obstructive pulmonary disease, unspecified: Secondary | ICD-10-CM | POA: Diagnosis not present

## 2021-08-16 DIAGNOSIS — Z01818 Encounter for other preprocedural examination: Secondary | ICD-10-CM | POA: Diagnosis not present

## 2021-08-16 DIAGNOSIS — Z1211 Encounter for screening for malignant neoplasm of colon: Secondary | ICD-10-CM | POA: Diagnosis not present

## 2021-08-16 DIAGNOSIS — K2970 Gastritis, unspecified, without bleeding: Secondary | ICD-10-CM | POA: Diagnosis not present

## 2021-08-16 DIAGNOSIS — E119 Type 2 diabetes mellitus without complications: Secondary | ICD-10-CM | POA: Diagnosis not present

## 2021-08-16 DIAGNOSIS — R110 Nausea: Secondary | ICD-10-CM | POA: Diagnosis not present

## 2021-09-09 DIAGNOSIS — J449 Chronic obstructive pulmonary disease, unspecified: Secondary | ICD-10-CM | POA: Diagnosis not present

## 2021-09-28 DIAGNOSIS — D126 Benign neoplasm of colon, unspecified: Secondary | ICD-10-CM | POA: Diagnosis not present

## 2021-09-28 DIAGNOSIS — R110 Nausea: Secondary | ICD-10-CM | POA: Diagnosis not present

## 2021-09-28 DIAGNOSIS — K648 Other hemorrhoids: Secondary | ICD-10-CM | POA: Diagnosis not present

## 2021-10-06 ENCOUNTER — Other Ambulatory Visit: Payer: Self-pay

## 2021-10-06 ENCOUNTER — Ambulatory Visit
Admission: EM | Admit: 2021-10-06 | Discharge: 2021-10-06 | Disposition: A | Discharge status: Home / Self Care | Payer: Medicare Other | Attending: Urgent Care | Admitting: Urgent Care

## 2021-10-06 ENCOUNTER — Encounter: Payer: Self-pay | Admitting: Emergency Medicine

## 2021-10-06 DIAGNOSIS — R1030 Lower abdominal pain, unspecified: Secondary | ICD-10-CM | POA: Insufficient documentation

## 2021-10-06 DIAGNOSIS — N939 Abnormal uterine and vaginal bleeding, unspecified: Secondary | ICD-10-CM | POA: Diagnosis present

## 2021-10-06 DIAGNOSIS — Z202 Contact with and (suspected) exposure to infections with a predominantly sexual mode of transmission: Secondary | ICD-10-CM | POA: Diagnosis present

## 2021-10-06 DIAGNOSIS — R102 Pelvic and perineal pain: Secondary | ICD-10-CM | POA: Insufficient documentation

## 2021-10-06 DIAGNOSIS — N898 Other specified noninflammatory disorders of vagina: Secondary | ICD-10-CM | POA: Insufficient documentation

## 2021-10-06 DIAGNOSIS — E282 Polycystic ovarian syndrome: Secondary | ICD-10-CM | POA: Insufficient documentation

## 2021-10-06 MED ORDER — MEGESTROL ACETATE 40 MG PO TABS: 80.0000 mg | ORAL_TABLET | Freq: Every day | ORAL | 0 refills | Status: DC

## 2021-10-06 MED ORDER — CEFTRIAXONE SODIUM 500 MG IJ SOLR
500.0000 mg | Freq: Once | INTRAMUSCULAR | Status: AC
  Administered 2021-10-06: 500 mg via INTRAMUSCULAR

## 2021-10-06 MED ORDER — DOXYCYCLINE HYCLATE 100 MG PO CAPS: 100.0000 mg | ORAL_CAPSULE | Freq: Two times a day (BID) | ORAL | 0 refills | Status: DC

## 2021-10-06 NOTE — ED Provider Notes (Signed)
?[place] ? ? ?MRN: [id number] DOB: 07/28/1980 ? ?Subjective:  ? ?Kaitlyn Chambers is a 41 y.o. female presenting for 2-monthhistory of progressively worsening now more constant lower abdominal and pelvic pain with vaginal discharge this past week.  The pains have been much worse causing chills, vomiting times.  Patient is currently on her cycle now, started the first of the month and is very regular. Had a leep procedure done ~3 years ago.  And is planning on following up with her gynecologist as soon as possible.  She does have a history of PCOS as well.  Reports that her primary concern is for sexually transmitted infection as her husband has regularly cheated on her and one of the previous occasions the female turned out to have HIV.  She does believe that he is actually having sex with prostitutes at the moment.  Wants to have a complete screening. ? ?No current facility-administered medications for this encounter. ? ?Current Outpatient Medications:  ?  aspirin EC 81 MG EC tablet, Take 1 tablet (81 mg total) by mouth daily., Disp: 30 tablet, Rfl: 0 ?  atorvastatin (LIPITOR) 80 MG tablet, Take 80 mg by mouth daily., Disp: , Rfl:  ?  Blood Glucose Monitoring Suppl (ONE TOUCH ULTRA 2) w/Device KIT, 1 kit by Does not apply route daily., Disp: 1 kit, Rfl: 0 ?  empagliflozin (JARDIANCE) 25 MG TABS tablet, Take 25 mg by mouth daily., Disp: , Rfl:  ?  fenofibrate (TRICOR) 145 MG tablet, Take 1 tablet (145 mg total) by mouth daily., Disp: 90 tablet, Rfl: 3 ?  FLUoxetine (PROZAC) 40 MG capsule, Take 1 capsule (40 mg total) by mouth at bedtime. Further refills will come from Psychiatry, Disp: 30 capsule, Rfl: 0 ?  furosemide (LASIX) 40 MG tablet, Take 1 tablet (40 mg total) by mouth as needed. (Patient taking differently: Take 40 mg by mouth daily.), Disp: 90 tablet, Rfl: 1 ?  glucose blood (ONETOUCH ULTRA) test strip, Check blood sugars once daily  before breakfast (fasting) Dx: E11.9, Disp: 100 each,  Rfl: 12 ?  irbesartan (AVAPRO) 150 MG tablet, Take 150 mg by mouth daily., Disp: , Rfl:  ?  Lancets (FREESTYLE) lancets, Use as instructed, Disp: 100 each, Rfl: 12 ?  Lurasidone HCl (LATUDA) 60 MG TABS, Take 1 tablet (60 mg total) by mouth daily., Disp: 90 tablet, Rfl: 1 ?  MAGNESIUM PO, Take 1 tablet by mouth daily., Disp: , Rfl:  ?  nitroGLYCERIN (NITROSTAT) 0.4 MG SL tablet, Place 0.4 mg under the tongue every 5 (five) minutes as needed for chest pain., Disp: , Rfl:  ?  potassium chloride SA (KLOR-CON) 20 MEQ tablet, Take 20 mEq by mouth daily as needed (cramps)., Disp: , Rfl:  ?  prazosin (MINIPRESS) 1 MG capsule, Take 2 mg by mouth at bedtime., Disp: , Rfl:  ?  Semaglutide, 1 MG/DOSE, (OZEMPIC, 1 MG/DOSE,) 2 MG/1.5ML SOPN, Inject 1 mg into the skin every Tuesday., Disp: , Rfl:  ?  Simethicone (GAS-X PO), Take 2 tablets by mouth daily as needed (gas)., Disp: , Rfl:   ? ?Allergies  ?Allergen Reactions  ? Latex Swelling  ? Levaquin [Levofloxacin] Rash  ? Metoprolol Nausea And Vomiting and Other (See Comments)  ?  Sweats and dizziness  ? Tape Rash  ?  Tears skin  ? ? ?Past Medical History:  ?Diagnosis Date  ? Anxiety   ? Bipolar 1 disorder, manic, full remission ([place]   ? CHF (congestive heart failure) (  [place])   ? COPD (chronic obstructive pulmonary disease) ([place])   ? Coronary artery disease   ? Depression   ? GERD (gastroesophageal reflux disease)   ? Gout   ? High cholesterol   ? Hypertension   ? Hypothyroidism   ? Metabolic syndrome   ? NSTEMI (non-ST elevated myocardial infarction) ([place]) 07/01/2016  ? On home oxygen therapy   ? "available; don't use it" (07/01/2016)  ? PCOS (polycystic ovarian syndrome)   ? Stomach ulcer   ? Type 2 diabetes, diet controlled ([place])   ?  ? ?Past Surgical History:  ?Procedure Laterality Date  ? CARDIAC CATHETERIZATION N/A 07/01/2016  ? Procedure: LEFT HEART CATH AND CORONARY ANGIOGRAPHY;  Surgeon: Burnell Blanks, MD;  Location: [facility];  Service: Cardiovascular;   Laterality: N/A;  ? CARDIAC CATHETERIZATION N/A 07/01/2016  ? Procedure: Coronary Stent Intervention;  Surgeon: Burnell Blanks, MD;  Location: [facility];  Service: Cardiovascular;  Laterality: N/A;  ? CARPAL TUNNEL RELEASE Bilateral   ? CERVICAL BIOPSY  W/ LOOP ELECTRODE EXCISION  05/2016  ? "precancerous cells"  ? CERVICAL BIOPSY  W/ LOOP ELECTRODE EXCISION    ? CORONARY ANGIOPLASTY WITH STENT PLACEMENT  07/01/2016  ? TONSILLECTOMY    ? ? ?Family History  ?Problem Relation Age of Onset  ? Diabetes Mother   ? Heart failure Mother   ? Heart attack Mother 60  ? Heart disease Mother   ? Alcohol abuse Mother   ? Arthritis Mother   ? COPD Mother   ? Depression Mother   ? Drug abuse Mother   ? Hyperlipidemia Mother   ? Hypertension Mother   ? Intellectual disability Mother   ? Healthy Son   ? Alcohol abuse Father   ? Arthritis Father   ? Depression Father   ? Intellectual disability Father   ? Early death Father   ? Alcohol abuse Sister   ? Arthritis Sister   ? Depression Sister   ? Intellectual disability Sister   ? Intellectual disability Maternal Grandmother   ? Hypertension Maternal Grandmother   ? Hyperlipidemia Maternal Grandmother   ? Heart disease Maternal Grandmother   ? Diabetes Maternal Grandmother   ? Depression Maternal Grandmother   ? COPD Maternal Grandmother   ? Alcohol abuse Maternal Grandfather   ? Arthritis Maternal Grandfather   ? COPD Maternal Grandfather   ? Diabetes Maternal Grandfather   ? Heart attack Maternal Grandfather   ? Heart disease Maternal Grandfather   ? Hyperlipidemia Maternal Grandfather   ? Hypertension Maternal Grandfather   ? Intellectual disability Maternal Grandfather   ? Arthritis Sister   ? ? ?Social History  ? ?Tobacco Use  ? Smoking status: Every Day  ?  Packs/day: 0.25  ?  Years: 26.00  ?  Pack years: 6.50  ?  Types: Cigarettes  ? Smokeless tobacco: Never  ?Vaping Use  ? Vaping Use: Never used  ?Substance Use Topics  ? Alcohol use: No  ? Drug use: Not  Currently  ?  Types: Marijuana  ? ? ?ROS ? ? ?Objective:  ? ?Vitals: ?BP (!) 157/94 (BP Location: Right Arm)   Pulse (!) 104   Temp 98.7 ?F (37.1 ?C) (Oral)   Resp 18   Ht _0  (1.702 m)   Wt 295 lb (133.8 kg)   LMP 10/04/2021 (Approximate)   SpO2 98%   BMI 46.20 kg/m?  ? ?Physical Exam ?Constitutional:   ?   General: She  is not in acute distress. ?   Appearance: Normal appearance. She is well-developed. She is not ill-appearing, toxic-appearing or diaphoretic.  ?HENT:  ?   Head: Normocephalic and atraumatic.  ?   Nose: Nose normal.  ?   Mouth/Throat:  ?   Mouth: Mucous membranes are moist.  ?Eyes:  ?   General: No scleral icterus.    ?   Right eye: No discharge.     ?   Left eye: No discharge.  ?   Extraocular Movements: Extraocular movements intact.  ?   Conjunctiva/sclera: Conjunctivae normal.  ?Cardiovascular:  ?   Rate and Rhythm: Normal rate.  ?Pulmonary:  ?   Effort: Pulmonary effort is normal.  ?Abdominal:  ?   General: Bowel sounds are normal. There is no distension.  ?   Palpations: Abdomen is soft. There is no mass.  ?   Tenderness: There is generalized abdominal tenderness and tenderness in the right lower quadrant, suprapubic area and left lower quadrant. There is no right CVA tenderness, left CVA tenderness, guarding or rebound.  ?Skin: ?   General: Skin is warm and dry.  ?Neurological:  ?   General: No focal deficit present.  ?   Mental Status: She is alert and oriented to person, place, and time.  ?Psychiatric:     ?   Mood and Affect: Mood normal.     ?   Behavior: Behavior normal.     ?   Thought Content: Thought content normal.     ?   Judgment: Judgment normal.  ? ? ?Assessment and Plan :  ? ?PDMP not reviewed this encounter. ? ?1. Pelvic pain   ?2. Possible exposure to STD   ?3. Lower abdominal pain   ?4. PCOS (polycystic ovarian syndrome)   ?5. Vaginal discharge   ? ?Patient treated empirically as per CDC guidelines with IM ceftriaxone, doxycycline as an outpatient.  Labs pending.    However, given that she also has a history of PCOS and is currently having heavy vaginal bleeding I discussed the possibility of this is the primary source of her symptoms.  She has previously done well with Megace and

## 2021-10-06 NOTE — ED Triage Notes (Addendum)
Pt reports more intense abdominal pain with each menstrual cycle x3 months. Pt reports intermittent vomiting, chills, increase vaginal bleeding, and reports "something felt like it dropped" yesterday. ? ?Pt reports is in the process of getting an OB-GYN and recently had an endoscopy/colonoscopy and is scheduled for an ultrasound related to gallbladder. Has plan to see PCP on Saturday as walk-in but reports pain became too intense and reported "I couldn't take it anymore." ? ?Pt also reports is concerned could have been exposed to possible STD. ?

## 2021-10-07 ENCOUNTER — Telehealth: Payer: Self-pay | Admitting: Urgent Care

## 2021-10-07 LAB — RPR: RPR Ser Ql: NONREACTIVE

## 2021-10-07 LAB — HIV ANTIBODY (ROUTINE TESTING W REFLEX): HIV Screen 4th Generation wRfx: NONREACTIVE

## 2021-10-07 LAB — CERVICOVAGINAL ANCILLARY ONLY
Bacterial Vaginitis (gardnerella): NEGATIVE
Candida Glabrata: NEGATIVE
Candida Vaginitis: NEGATIVE
Chlamydia: NEGATIVE
Comment: NEGATIVE
Comment: NEGATIVE
Comment: NEGATIVE
Comment: NEGATIVE
Comment: NEGATIVE
Comment: NORMAL
Neisseria Gonorrhea: NEGATIVE
Trichomonas: POSITIVE — AB

## 2021-10-07 MED ORDER — METRONIDAZOLE 500 MG PO TABS: 500.0000 mg | ORAL_TABLET | Freq: Two times a day (BID) | ORAL | 0 refills | Status: DC

## 2021-10-07 NOTE — Telephone Encounter (Signed)
Patient tested positive for trichomoniasis and will need treatment with Flagyl.  A prescription was sent to her pharmacy on [address].  Patient is to stop doxycycline since she did not test positive for chlamydia. ?

## 2021-10-08 ENCOUNTER — Telehealth (HOSPITAL_COMMUNITY): Payer: Self-pay | Admitting: Emergency Medicine

## 2021-10-10 DIAGNOSIS — J449 Chronic obstructive pulmonary disease, unspecified: Secondary | ICD-10-CM | POA: Diagnosis not present

## 2021-11-09 DIAGNOSIS — J449 Chronic obstructive pulmonary disease, unspecified: Secondary | ICD-10-CM | POA: Diagnosis not present

## 2021-11-23 DIAGNOSIS — R110 Nausea: Secondary | ICD-10-CM | POA: Diagnosis not present

## 2021-12-10 DIAGNOSIS — J449 Chronic obstructive pulmonary disease, unspecified: Secondary | ICD-10-CM | POA: Diagnosis not present

## 2022-01-09 DIAGNOSIS — J449 Chronic obstructive pulmonary disease, unspecified: Secondary | ICD-10-CM | POA: Diagnosis not present

## 2022-01-11 ENCOUNTER — Other Ambulatory Visit (HOSPITAL_COMMUNITY): Payer: Self-pay | Admitting: Gastroenterology

## 2022-01-11 DIAGNOSIS — R1084 Generalized abdominal pain: Secondary | ICD-10-CM

## 2022-01-20 ENCOUNTER — Encounter: Payer: Self-pay | Admitting: Emergency Medicine

## 2022-01-20 ENCOUNTER — Ambulatory Visit
Admission: EM | Admit: 2022-01-20 | Discharge: 2022-01-20 | Disposition: A | Discharge status: Home / Self Care | Payer: Medicare Other | Attending: Family Medicine | Admitting: Family Medicine

## 2022-01-20 ENCOUNTER — Other Ambulatory Visit: Payer: Self-pay

## 2022-01-20 DIAGNOSIS — M109 Gout, unspecified: Secondary | ICD-10-CM

## 2022-01-20 MED ORDER — COLCHICINE 0.6 MG PO TABS: ORAL_TABLET | ORAL | 0 refills | Status: DC

## 2022-01-20 NOTE — ED Provider Notes (Signed)
[facility]    CSN: [id number] Arrival date & time: 01/20/22  1634      History   Chief Complaint Chief Complaint  Patient presents with   Ankle Pain    HPI Kaitlyn Chambers is a 41 y.o. female.   Presenting today with 1 day history of severe right ankle pain, redness, warmth, swelling that started suddenly last night with no known injury.  She states even the bedsheet sitting on it hurts.  She states it feels very consistent with past gout flares.  Denies numbness, tingling, loss of range of motion, fevers, chills.  History of chronic venous insufficiency so legs are chronically red and swollen but this is above baseline.    Past Medical History:  Diagnosis Date   Anxiety    Bipolar 1 disorder, manic, full remission ([facility])    CHF (congestive heart failure) ([facility])    COPD (chronic obstructive pulmonary disease) ([facility])    Coronary artery disease    Depression    GERD (gastroesophageal reflux disease)    Gout    High cholesterol    Hypertension    Hypothyroidism    Metabolic syndrome    NSTEMI (non-ST elevated myocardial infarction) ([place]) 07/01/2016   On home oxygen therapy    "available; don't use it" (07/01/2016)   PCOS (polycystic ovarian syndrome)    Stomach ulcer    Type 2 diabetes, diet controlled ([place])     Patient Active Problem List   Diagnosis Date Noted   PAD (peripheral artery disease) ([place]) 02/16/2021   Chronic venous insufficiency 09/17/2019   Dyslipidemia 03/15/2017   Leukocytosis 03/15/2017   CAD (coronary artery disease) 03/15/2017   Diabetes mellitus ([place])    Morbid obesity ([place]) 07/01/2016   PCOS (polycystic ovarian syndrome) 07/01/2016   Hypertension 07/01/2016   Bipolar 1 disorder ([place])    NSTEMI (non-ST elevated myocardial infarction) ([place])    Chronic insomnia 01/31/2013    Past Surgical History:  Procedure Laterality Date   CARDIAC CATHETERIZATION N/A 07/01/2016   Procedure: LEFT HEART CATH AND CORONARY ANGIOGRAPHY;  Surgeon:  Burnell Blanks, MD;  Location: [facility];  Service: Cardiovascular;  Laterality: N/A;   CARDIAC CATHETERIZATION N/A 07/01/2016   Procedure: Coronary Stent Intervention;  Surgeon: Burnell Blanks, MD;  Location: [facility];  Service: Cardiovascular;  Laterality: N/A;   CARPAL TUNNEL RELEASE Bilateral    CERVICAL BIOPSY  W/ LOOP ELECTRODE EXCISION  05/2016   "precancerous cells"   CERVICAL BIOPSY  W/ LOOP ELECTRODE EXCISION     CORONARY ANGIOPLASTY WITH STENT PLACEMENT  07/01/2016   TONSILLECTOMY      OB History     Gravida  1   Para      Term  0   Preterm  0   AB  1   Living  0      SAB  1   IAB  0   Ectopic  0   Multiple  0   Live Births               Home Medications    Prior to Admission medications   Medication Sig Start Date End Date Taking? Authorizing Provider  colchicine 0.6 MG tablet Take 2 tabs at onset of pain, then may repeat one dose in 2 hours if not resolving. Repeat daily until resolved. Max of 3 tabs daily 01/20/22  Yes Volney American, [place]  aspirin EC 81 MG EC tablet Take 1 tablet (81  mg total) by mouth daily. 07/04/16   Hongalgi, Lenis Dickinson, MD  atorvastatin (LIPITOR) 80 MG tablet Take 80 mg by mouth daily.    [provider]  Blood Glucose Monitoring Suppl (ONE TOUCH ULTRA 2) w/Device KIT 1 kit by Does not apply route daily. 08/09/19   Brunetta Jeans, PA-C  doxycycline (VIBRAMYCIN) 100 MG capsule Take 1 capsule (100 mg total) by mouth 2 (two) times daily. 10/06/21   Jaynee Eagles, PA-C  empagliflozin (JARDIANCE) 25 MG TABS tablet Take 25 mg by mouth daily.    [provider]  fenofibrate (TRICOR) 145 MG tablet Take 1 tablet (145 mg total) by mouth daily. 07/19/19   Jerline Pain, MD  FLUoxetine (PROZAC) 40 MG capsule Take 1 capsule (40 mg total) by mouth at bedtime. Further refills will come from Psychiatry 10/22/19   Brunetta Jeans, PA-C  furosemide (LASIX) 40 MG tablet Take 1 tablet (40 mg  total) by mouth as needed. Patient taking differently: Take 40 mg by mouth daily. 08/09/19   Brunetta Jeans, PA-C  glucose blood [facility] ULTRA) test strip Check blood sugars once daily  before breakfast (fasting) Dx: E11.9 08/15/19   Brunetta Jeans, PA-C  irbesartan (AVAPRO) 150 MG tablet Take 150 mg by mouth daily.    [provider]  Lancets (FREESTYLE) lancets Use as instructed 08/07/19   Brunetta Jeans, PA-C  Lurasidone HCl (LATUDA) 60 MG TABS Take 1 tablet (60 mg total) by mouth daily. 08/13/19   Brunetta Jeans, PA-C  MAGNESIUM PO Take 1 tablet by mouth daily.    [provider]  megestrol (MEGACE) 40 MG tablet Take 2 tablets (80 mg total) by mouth daily. 10/06/21   Jaynee Eagles, PA-C  metroNIDAZOLE (FLAGYL) 500 MG tablet Take 1 tablet (500 mg total) by mouth 2 (two) times daily with a meal. DO NOT CONSUME ALCOHOL WHILE TAKING THIS MEDICATION. 10/07/21   Jaynee Eagles, PA-C  nitroGLYCERIN (NITROSTAT) 0.4 MG SL tablet Place 0.4 mg under the tongue every 5 (five) minutes as needed for chest pain.    [provider]  potassium chloride SA (KLOR-CON) 20 MEQ tablet Take 20 mEq by mouth daily as needed (cramps).    [provider]  prazosin (MINIPRESS) 1 MG capsule Take 2 mg by mouth at bedtime.    [provider]  Semaglutide, 1 MG/DOSE, (OZEMPIC, 1 MG/DOSE,) 2 MG/1.5ML SOPN Inject 1 mg into the skin every Tuesday.    [provider]  Simethicone (GAS-X PO) Take 2 tablets by mouth daily as needed (gas).    [provider]  gabapentin (NEURONTIN) 300 MG capsule Take 300 mg by mouth 2 (two) times daily.  04/30/19  [provider]  temazepam (RESTORIL) 30 MG capsule Take 30 mg by mouth at bedtime.  01/31/18 04/30/19  [provider]  VYVANSE 70 MG capsule Take 70 mg by mouth daily.  01/31/18 03/13/19  [provider]    Family History Family History  Problem Relation Age of Onset   Diabetes Mother    Heart  failure Mother    Heart attack Mother 30   Heart disease Mother    Alcohol abuse Mother    Arthritis Mother    COPD Mother    Depression Mother    Drug abuse Mother    Hyperlipidemia Mother    Hypertension Mother    Intellectual disability Mother    Healthy Son    Alcohol abuse Father    Arthritis  Father    Depression Father    Intellectual disability Father    Early death Father    Alcohol abuse Sister    Arthritis Sister    Depression Sister    Intellectual disability Sister    Intellectual disability Maternal Grandmother    Hypertension Maternal Grandmother    Hyperlipidemia Maternal Grandmother    Heart disease Maternal Grandmother    Diabetes Maternal Grandmother    Depression Maternal Grandmother    COPD Maternal Grandmother    Alcohol abuse Maternal Grandfather    Arthritis Maternal Grandfather    COPD Maternal Grandfather    Diabetes Maternal Grandfather    Heart attack Maternal Grandfather    Heart disease Maternal Grandfather    Hyperlipidemia Maternal Grandfather    Hypertension Maternal Grandfather    Intellectual disability Maternal Grandfather    Arthritis Sister     Social History Social History   Tobacco Use   Smoking status: Every Day    Packs/day: 0.25    Years: 26.00    Total pack years: 6.50    Types: Cigarettes   Smokeless tobacco: Never  Vaping Use   Vaping Use: Never used  Substance Use Topics   Alcohol use: No   Drug use: Not Currently    Types: Marijuana     Allergies   Latex, Levaquin [levofloxacin], Metoprolol, and Tape   Review of Systems Review of Systems Per HPI  Physical Exam Triage Vital Signs ED Triage Vitals  Enc Vitals Group     BP 01/20/22 1800 (!) 166/106     Pulse Rate 01/20/22 1800 96     Resp 01/20/22 1800 18     Temp 01/20/22 1800 99.4 F (37.4 C)     Temp Source 01/20/22 1800 Oral     SpO2 01/20/22 1800 96 %     Weight --      Height --      Head Circumference --      Peak Flow --      Pain  Score 01/20/22 1801 8     Pain Loc --      Pain Edu? --      Excl. in [place]? --    No data found.  Updated Vital Signs BP (!) 166/106 (BP Location: Right Arm) Comment: pt reports is on bp medication and reports "thats pretty good for me."  Pulse 96   Temp 99.4 F (37.4 C) (Oral)   Resp 18   LMP 01/09/2022 (Approximate)   SpO2 96%   Visual Acuity Right Eye Distance:   Left Eye Distance:   Bilateral Distance:    Right Eye Near:   Left Eye Near:    Bilateral Near:     Physical Exam Vitals and nursing note reviewed.  Constitutional:      Appearance: Normal appearance. She is not ill-appearing.  HENT:     Head: Atraumatic.  Eyes:     Extraocular Movements: Extraocular movements intact.     Conjunctiva/sclera: Conjunctivae normal.  Cardiovascular:     Rate and Rhythm: Normal rate and regular rhythm.     Heart sounds: Normal heart sounds.  Pulmonary:     Effort: Pulmonary effort is normal.     Breath sounds: Normal breath sounds.  Musculoskeletal:        General: Swelling and tenderness present. Normal range of motion.     Cervical back: Normal range of motion and neck supple.     Comments: Chronic venous insufficiency changes bilateral lower legs  which patient states are at baseline, hyper pigmentation and edema.  Severe tenderness to palpation right anterior ankle and area of worsening redness, edema  Skin:    General: Skin is warm and dry.     Findings: Erythema present.  Neurological:     Mental Status: She is alert and oriented to person, place, and time.     Comments: Right lower extremity neurovascular intact  Psychiatric:        Mood and Affect: Mood normal.        Thought Content: Thought content normal.        Judgment: Judgment normal.    UC Treatments / Results  Labs (all labs ordered are listed, but only abnormal results are displayed) Labs Reviewed - No data to display  EKG   Radiology No results found.  Procedures Procedures (including critical  care time)  Medications Ordered in UC Medications - No data to display  Initial Impression / Assessment and Plan / UC Course  I have reviewed the triage vital signs and the nursing notes.  Pertinent labs & imaging results that were available during my care of the patient were reviewed by me and considered in my medical decision making (see chart for details).     Treat with colchicine for suspected gout flare, tart cherry supplements, diet changes.  Return for worsening symptoms.  Declines laboratory testing today.  Final Clinical Impressions(s) / UC Diagnoses   Final diagnoses:  Acute gout of right ankle, unspecified cause   Discharge Instructions   None    ED Prescriptions     Medication Sig Dispense Auth. Provider   colchicine 0.6 MG tablet Take 2 tabs at onset of pain, then may repeat one dose in 2 hours if not resolving. Repeat daily until resolved. Max of 3 tabs daily 20 tablet Volney American, [place]      PDMP not reviewed this encounter.   Volney American, [place] 01/20/22 1857

## 2022-01-20 NOTE — ED Triage Notes (Addendum)
Pt reports right ankle pain, intermittent nausea since last night. Pt denies any known injury.pt reports history of venous insufficiency in B[place] and gout.

## 2022-01-28 ENCOUNTER — Other Ambulatory Visit: Payer: Self-pay | Admitting: Family Medicine

## 2022-01-28 NOTE — Telephone Encounter (Signed)
Pt needs to contact PCP- med was prescribed at Urgent Care  Requested Prescriptions  Refused Prescriptions Disp Refills  . Colchicine 0.6 MG CAPS [Pharmacy Med Name: COLCHICINE 0.'6MG'$  CAPSULES] 20 capsule     Sig: TAKE 2 CAPSULES BY MOUTH AT ONSET OF PAIN. MAY REPEAT 1 DOSE IN 2 HOURS IF NOT RESOLVING. MAY REPEAT DAILY UNTIL RESOLVED, MAX OF 3 CAPSULES DAILY     There is no refill protocol information for this order

## 2022-02-07 ENCOUNTER — Telehealth: Payer: Self-pay | Admitting: *Deleted

## 2022-02-07 ENCOUNTER — Encounter (HOSPITAL_COMMUNITY): Payer: Medicare Other

## 2022-02-07 NOTE — Progress Notes (Unsigned)
Office Note     CC:  follow up Requesting Provider:  Carylon Perches, NP  HPI: Kaitlyn Chambers is a 41 y.o. (1981/02/22) female who presents for evaluation of worsening swelling of the RLE. She has long history of edema and venous insufficiency not amenable to any intervention. She has been encouraged to elevate and wear compression. She was just seen in the ER on 7/20 and diagnosed with gout. However she reports today that despite treatment with colchicine her swelling has continued.   She has known mild Aorto iliac disease, but she has been without any disabling symptoms. She also has moderate popliteal stenosis bilaterally. She has had palpable dorsalis pedis pulses in both her feet. No tissue loss, disabling claudication or rest pain.    The pt is on a statin for cholesterol management.  The pt is on a daily aspirin.   Other AC:  None The pt is on ARB for hypertension.   The pt is diabetic.   Tobacco hx:  current, 1/4 ppd  Past Medical History:  Diagnosis Date   Anxiety    Bipolar 1 disorder, manic, full remission (HCC)    CHF (congestive heart failure) (HCC)    COPD (chronic obstructive pulmonary disease) (HCC)    Coronary artery disease    Depression    GERD (gastroesophageal reflux disease)    Gout    High cholesterol    Hypertension    Hypothyroidism    Metabolic syndrome    NSTEMI (non-ST elevated myocardial infarction) ([place]) 07/01/2016   On home oxygen therapy    "available; don't use it" (07/01/2016)   PCOS (polycystic ovarian syndrome)    Stomach ulcer    Type 2 diabetes, diet controlled ([place])     Past Surgical History:  Procedure Laterality Date   CARDIAC CATHETERIZATION N/A 07/01/2016   Procedure: LEFT HEART CATH AND CORONARY ANGIOGRAPHY;  Surgeon: Burnell Blanks, MD;  Location: [facility];  Service: Cardiovascular;  Laterality: N/A;   CARDIAC CATHETERIZATION N/A 07/01/2016   Procedure: Coronary Stent Intervention;  Surgeon: Burnell Blanks, MD;  Location: [facility];  Service: Cardiovascular;  Laterality: N/A;   CARPAL TUNNEL RELEASE Bilateral    CERVICAL BIOPSY  W/ LOOP ELECTRODE EXCISION  05/2016   "precancerous cells"   CERVICAL BIOPSY  W/ LOOP ELECTRODE EXCISION     CORONARY ANGIOPLASTY WITH STENT PLACEMENT  07/01/2016   TONSILLECTOMY      Social History   Socioeconomic History   Marital status: Divorced    Spouse name: Not on file   Number of children: Not on file   Years of education: Not on file   Highest education level: Not on file  Occupational History   Not on file  Tobacco Use   Smoking status: Every Day    Packs/day: 0.25    Years: 26.00    Total pack years: 6.50    Types: Cigarettes   Smokeless tobacco: Never  Vaping Use   Vaping Use: Never used  Substance and Sexual Activity   Alcohol use: No   Drug use: Not Currently    Types: Marijuana   Sexual activity: Yes    Birth control/protection: None  Other Topics Concern   Not on file  Social History Narrative   Not on file   Social Determinants of Health   Financial Resource Strain: Not on file  Food Insecurity: Not on file  Transportation Needs: Not on file  Physical Activity: Not on file  Stress: Not on file  Social Connections: Not on file  Intimate Partner Violence: Not on file   *** Family History  Problem Relation Age of Onset   Diabetes Mother    Heart failure Mother    Heart attack Mother 24   Heart disease Mother    Alcohol abuse Mother    Arthritis Mother    COPD Mother    Depression Mother    Drug abuse Mother    Hyperlipidemia Mother    Hypertension Mother    Intellectual disability Mother    Healthy Son    Alcohol abuse Father    Arthritis Father    Depression Father    Intellectual disability Father    Early death Father    Alcohol abuse Sister    Arthritis Sister    Depression Sister    Intellectual disability Sister    Intellectual disability Maternal Grandmother    Hypertension  Maternal Grandmother    Hyperlipidemia Maternal Grandmother    Heart disease Maternal Grandmother    Diabetes Maternal Grandmother    Depression Maternal Grandmother    COPD Maternal Grandmother    Alcohol abuse Maternal Grandfather    Arthritis Maternal Grandfather    COPD Maternal Grandfather    Diabetes Maternal Grandfather    Heart attack Maternal Grandfather    Heart disease Maternal Grandfather    Hyperlipidemia Maternal Grandfather    Hypertension Maternal Grandfather    Intellectual disability Maternal Grandfather    Arthritis Sister     Current Outpatient Medications  Medication Sig Dispense Refill   aspirin EC 81 MG EC tablet Take 1 tablet (81 mg total) by mouth daily. 30 tablet 0   atorvastatin (LIPITOR) 80 MG tablet Take 80 mg by mouth daily.     Blood Glucose Monitoring Suppl (ONE TOUCH ULTRA 2) w/Device KIT 1 kit by Does not apply route daily. 1 kit 0   colchicine 0.6 MG tablet Take 2 tabs at onset of pain, then may repeat one dose in 2 hours if not resolving. Repeat daily until resolved. Max of 3 tabs daily 20 tablet 0   doxycycline (VIBRAMYCIN) 100 MG capsule Take 1 capsule (100 mg total) by mouth 2 (two) times daily. 14 capsule 0   empagliflozin (JARDIANCE) 25 MG TABS tablet Take 25 mg by mouth daily.     fenofibrate (TRICOR) 145 MG tablet Take 1 tablet (145 mg total) by mouth daily. 90 tablet 3   FLUoxetine (PROZAC) 40 MG capsule Take 1 capsule (40 mg total) by mouth at bedtime. Further refills will come from Psychiatry 30 capsule 0   furosemide (LASIX) 40 MG tablet Take 1 tablet (40 mg total) by mouth as needed. (Patient taking differently: Take 40 mg by mouth daily.) 90 tablet 1   glucose blood (ONETOUCH ULTRA) test strip Check blood sugars once daily  before breakfast (fasting) Dx: E11.9 100 each 12   irbesartan (AVAPRO) 150 MG tablet Take 150 mg by mouth daily.     Lancets (FREESTYLE) lancets Use as instructed 100 each 12   Lurasidone HCl (LATUDA) 60 MG TABS Take  1 tablet (60 mg total) by mouth daily. 90 tablet 1   MAGNESIUM PO Take 1 tablet by mouth daily.     megestrol (MEGACE) 40 MG tablet Take 2 tablets (80 mg total) by mouth daily. 10 tablet 0   metroNIDAZOLE (FLAGYL) 500 MG tablet Take 1 tablet (500 mg total) by mouth 2 (two) times daily with a meal. DO NOT CONSUME ALCOHOL  WHILE TAKING THIS MEDICATION. 14 tablet 0   nitroGLYCERIN (NITROSTAT) 0.4 MG SL tablet Place 0.4 mg under the tongue every 5 (five) minutes as needed for chest pain.     potassium chloride SA (KLOR-CON) 20 MEQ tablet Take 20 mEq by mouth daily as needed (cramps).     prazosin (MINIPRESS) 1 MG capsule Take 2 mg by mouth at bedtime.     Semaglutide, 1 MG/DOSE, (OZEMPIC, 1 MG/DOSE,) 2 MG/1.5ML SOPN Inject 1 mg into the skin every Tuesday.     Simethicone (GAS-X PO) Take 2 tablets by mouth daily as needed (gas).     No current facility-administered medications for this visit.    Allergies  Allergen Reactions   Latex Swelling   Levaquin [Levofloxacin] Rash   Metoprolol Nausea And Vomiting and Other (See Comments)    Sweats and dizziness   Tape Rash    Tears skin     REVIEW OF SYSTEMS:  *** '[X]'  denotes positive finding, '[ ]'  denotes negative finding Cardiac  Comments:  Chest pain or chest pressure:    Shortness of breath upon exertion:    Short of breath when lying flat:    Irregular heart rhythm:        Vascular    Pain in calf, thigh, or hip brought on by ambulation:    Pain in feet at night that wakes you up from your sleep:     Blood clot in your veins:    Leg swelling:         Pulmonary    Oxygen at home:    Productive cough:     Wheezing:         Neurologic    Sudden weakness in arms or legs:     Sudden numbness in arms or legs:     Sudden onset of difficulty speaking or slurred speech:    Temporary loss of vision in one eye:     Problems with dizziness:         Gastrointestinal    Blood in stool:     Vomited blood:         Genitourinary     Burning when urinating:     Blood in urine:        Psychiatric    Major depression:         Hematologic    Bleeding problems:    Problems with blood clotting too easily:        Skin    Rashes or ulcers:        Constitutional    Fever or chills:      PHYSICAL EXAMINATION:  There were no vitals filed for this visit.  General:  WDWN in NAD; vital signs documented above Gait: Not observed HENT: WNL, normocephalic Pulmonary: normal non-labored breathing , without Rales, rhonchi,  wheezing Cardiac: {Desc; regular/irreg:14544} HR, without  Murmurs {With/Without:20273} carotid bruit*** Abdomen: soft, NT, no masses Skin: {With/Without:20273} rashes Vascular Exam/Pulses:  Right Left  Radial {Exam; arterial pulse strength 0-4:30167} {Exam; arterial pulse strength 0-4:30167}  Ulnar {Exam; arterial pulse strength 0-4:30167} {Exam; arterial pulse strength 0-4:30167}  Femoral {Exam; arterial pulse strength 0-4:30167} {Exam; arterial pulse strength 0-4:30167}  Popliteal {Exam; arterial pulse strength 0-4:30167} {Exam; arterial pulse strength 0-4:30167}  DP {Exam; arterial pulse strength 0-4:30167} {Exam; arterial pulse strength 0-4:30167}  PT {Exam; arterial pulse strength 0-4:30167} {Exam; arterial pulse strength 0-4:30167}   Extremities: {With/Without:20273} ischemic changes, {With/Without:20273} Gangrene , {With/Without:20273} cellulitis; {With/Without:20273} open wounds;  Musculoskeletal: no muscle  wasting or atrophy  Neurologic: A&O X 3;  No focal weakness or paresthesias are detected Psychiatric:  The pt has {Desc; normal/abnormal:11317::"Normal"} affect.   Non-Invasive Vascular Imaging:   ***    ASSESSMENT/PLAN:: 41 y.o. female here for follow up for ***   -***   Karoline Caldwell, PA-C [facility] MD:   Roxanne Mins

## 2022-02-07 NOTE — Telephone Encounter (Signed)
Patient sent message through Patient Questionnaire stating swelling of bilateral lower extremities with discoloration.  An appointment was scheduled and an ABI as per last office visit.  Called patient multiple times,however voice mail is not set up.  I will continue trying to reach patient as her appointment is scheduled tomorrow, 02/08/2022.

## 2022-02-08 ENCOUNTER — Inpatient Hospital Stay (HOSPITAL_COMMUNITY): Admission: RE | Admit: 2022-02-08 | Payer: Medicare Other | Source: Ambulatory Visit

## 2022-02-08 ENCOUNTER — Ambulatory Visit: Payer: Medicare Other

## 2022-02-08 ENCOUNTER — Other Ambulatory Visit: Payer: Self-pay | Admitting: *Deleted

## 2022-02-08 DIAGNOSIS — I739 Peripheral vascular disease, unspecified: Secondary | ICD-10-CM

## 2022-02-09 DIAGNOSIS — J449 Chronic obstructive pulmonary disease, unspecified: Secondary | ICD-10-CM | POA: Diagnosis not present

## 2022-02-17 ENCOUNTER — Encounter: Payer: Self-pay | Admitting: Emergency Medicine

## 2022-02-17 ENCOUNTER — Other Ambulatory Visit: Payer: Self-pay

## 2022-02-17 ENCOUNTER — Ambulatory Visit
Admission: EM | Admit: 2022-02-17 | Discharge: 2022-02-17 | Disposition: A | Discharge status: Home / Self Care | Payer: Medicare Other | Attending: Family Medicine | Admitting: Family Medicine

## 2022-02-17 DIAGNOSIS — I10 Essential (primary) hypertension: Secondary | ICD-10-CM

## 2022-02-17 DIAGNOSIS — R600 Localized edema: Secondary | ICD-10-CM | POA: Diagnosis not present

## 2022-02-17 DIAGNOSIS — M7989 Other specified soft tissue disorders: Secondary | ICD-10-CM

## 2022-02-17 MED ORDER — FUROSEMIDE 40 MG PO TABS: 40.0000 mg | ORAL_TABLET | ORAL | 0 refills | Status: DC | PRN

## 2022-02-17 NOTE — Discharge Instructions (Addendum)
Your blood pressure was noted to be elevated during your visit today. If you are currently taking medication for high blood pressure, please ensure you are taking this as directed. If you do not have a history of high blood pressure and your blood pressure remains persistently elevated, you may need to begin taking a medication at some point. You may return here within the next few days to recheck if unable to see your primary care provider or if you do not have a one.  BP (!) 176/124 (BP Location: Right Arm)   Pulse (!) 102   Temp 98 F (36.7 C) (Oral)   Resp 16   LMP 01/09/2022 (Approximate)   SpO2 95%   BP Readings from Last 3 Encounters:  02/17/22 (!) 176/124  01/20/22 (!) 166/106  10/06/21 (!) 157/94

## 2022-02-17 NOTE — ED Triage Notes (Addendum)
Pt reports right ankle pain for "awhile" and reports was treated for gout but reports pain remains. Pt also reports decreased sensation in BLE at baseline but reports increased swelling and decreased ROM.

## 2022-02-19 LAB — COMPREHENSIVE METABOLIC PANEL
ALT: 24 IU/L (ref 0–32)
AST: 18 IU/L (ref 0–40)
Albumin/Globulin Ratio: 1.5 (ref 1.2–2.2)
Albumin: 4.3 g/dL (ref 3.9–4.9)
Alkaline Phosphatase: 107 IU/L (ref 44–121)
BUN/Creatinine Ratio: 13 (ref 9–23)
BUN: 11 mg/dL (ref 6–24)
Bilirubin Total: 0.2 mg/dL (ref 0.0–1.2)
CO2: 18 mmol/L — ABNORMAL LOW (ref 20–29)
Calcium: 9.3 mg/dL (ref 8.7–10.2)
Chloride: 97 mmol/L (ref 96–106)
Creatinine, Ser: 0.83 mg/dL (ref 0.57–1.00)
Globulin, Total: 2.9 g/dL (ref 1.5–4.5)
Glucose: 385 mg/dL — ABNORMAL HIGH (ref 70–99)
Potassium: 4.3 mmol/L (ref 3.5–5.2)
Sodium: 136 mmol/L (ref 134–144)
Total Protein: 7.2 g/dL (ref 6.0–8.5)
eGFR: 90 mL/min/{1.73_m2} (ref >59)

## 2022-02-19 LAB — CBC
Hematocrit: 47.8 % — ABNORMAL HIGH (ref 34.0–46.6)
Hemoglobin: 15.8 g/dL (ref 11.1–15.9)
MCH: 30.4 pg (ref 26.6–33.0)
MCHC: 33.1 g/dL (ref 31.5–35.7)
MCV: 92 fL (ref 79–97)
Platelets: 284 10*3/uL (ref 150–450)
RBC: 5.19 x10E6/uL (ref 3.77–5.28)
RDW: 13.1 % (ref 11.7–15.4)
WBC: 10.6 10*3/uL (ref 3.4–10.8)

## 2022-02-22 NOTE — ED Provider Notes (Signed)
[facility]   [id number] 02/17/22 Arrival Time: 1955  ASSESSMENT & PLAN:  1. Bilateral lower extremity edema   2. Elevated blood pressure reading in office with diagnosis of hypertension   3. Leg swelling    Unclear etiology. No s/s of HTN urgency. Understands importance of PCP f/u.  Begin trial of: Meds ordered this encounter  Medications   furosemide (LASIX) 40 MG tablet    Sig: Take 1 tablet (40 mg total) by mouth as needed.    Dispense:  30 tablet    Refill:  0   PENDING: Orders Placed This Encounter  Procedures   Comprehensive metabolic panel   CBC   Work/school excuse note: provided. Recommend:  Follow-up Information     Schedule an appointment as soon as possible for a visit  with Coral Spikes, DO.   Specialty: Family Medicine Contact information: [place] [address] [phone]                  Discharge Instructions      Your blood pressure was noted to be elevated during your visit today. If you are currently taking medication for high blood pressure, please ensure you are taking this as directed. If you do not have a history of high blood pressure and your blood pressure remains persistently elevated, you may need to begin taking a medication at some point. You may return here within the next few days to recheck if unable to see your primary care provider or if you do not have a one.  BP (!) 176/124 (BP Location: Right Arm)   Pulse (!) 102   Temp 98 F (36.7 C) (Oral)   Resp 16   LMP 01/09/2022 (Approximate)   SpO2 95%   BP Readings from Last 3 Encounters:  02/17/22 (!) 176/124  01/20/22 (!) 166/106  10/06/21 (!) 157/94         Reviewed expectations re: course of current medical issues. Questions answered. Outlined signs and symptoms indicating need for more acute intervention. Patient verbalized understanding. After Visit Summary given.  SUBJECTIVE: History from: patient. Kaitlyn Chambers is a  41 y.o. female who reports right ankle pain for "awhile" (with swelling) and reports being treated for gout; ankle still swollen and painful. Afebrile. No trauma. Knows blood sugars are high; taking medications as directed. Trying to find new PCP. No abd pain. Weight stable. Normal PO intake without n/v/d. No calf pain or swelling.  Past Surgical History:  Procedure Laterality Date   CARDIAC CATHETERIZATION N/A 07/01/2016   Procedure: LEFT HEART CATH AND CORONARY ANGIOGRAPHY;  Surgeon: Burnell Blanks, MD;  Location: [facility];  Service: Cardiovascular;  Laterality: N/A;   CARDIAC CATHETERIZATION N/A 07/01/2016   Procedure: Coronary Stent Intervention;  Surgeon: Burnell Blanks, MD;  Location: [facility];  Service: Cardiovascular;  Laterality: N/A;   CARPAL TUNNEL RELEASE Bilateral    CERVICAL BIOPSY  W/ LOOP ELECTRODE EXCISION  05/2016   "precancerous cells"   CERVICAL BIOPSY  W/ LOOP ELECTRODE EXCISION     CORONARY ANGIOPLASTY WITH STENT PLACEMENT  07/01/2016   TONSILLECTOMY      Increased blood pressure noted today. Reports that she is treated for HTN.  She reports taking medications as instructed, no chest pain on exertion, no dyspnea on exertion, no orthostatic dizziness or lightheadedness, no orthopnea or paroxysmal nocturnal dyspnea, and no palpitations.   OBJECTIVE:  Vitals:   02/17/22 2003  BP: Marland Kitchen)  176/124  Pulse: (!) 102  Resp: 16  Temp: 98 F (36.7 C)  TempSrc: Oral  SpO2: 95%    Recheck P: 94 General appearance: alert; no distress HEENT: [place]; AT Neck: supple with FROM Resp: unlabored respirations Extremities: RLE: warm with well perfused appearance; fairly well localized moderate tenderness around R ankle; with 1+ pitting edema; bruising: none; ankle ROM: normal CV: brisk extremity capillary refill of RLE; 1+ DP pulse of RLE. Skin: warm and dry; no visible rashes Neurologic: gait normal; normal sensation and strength of bilateral  LE Psychological: alert and cooperative; normal mood and affect   Allergies  Allergen Reactions   Latex Swelling   Levaquin [Levofloxacin] Rash   Metoprolol Nausea And Vomiting and Other (See Comments)    Sweats and dizziness   Tape Rash    Tears skin    Past Medical History:  Diagnosis Date   Anxiety    Bipolar 1 disorder, manic, full remission (HCC)    CHF (congestive heart failure) (HCC)    COPD (chronic obstructive pulmonary disease) (HCC)    Coronary artery disease    Depression    GERD (gastroesophageal reflux disease)    Gout    High cholesterol    Hypertension    Hypothyroidism    Metabolic syndrome    NSTEMI (non-ST elevated myocardial infarction) ([place]) 07/01/2016   On home oxygen therapy    "available; don't use it" (07/01/2016)   PCOS (polycystic ovarian syndrome)    Stomach ulcer    Type 2 diabetes, diet controlled ([place])    Social History   Socioeconomic History   Marital status: Divorced    Spouse name: Not on file   Number of children: Not on file   Years of education: Not on file   Highest education level: Not on file  Occupational History   Not on file  Tobacco Use   Smoking status: Every Day    Packs/day: 0.25    Years: 26.00    Total pack years: 6.50    Types: Cigarettes   Smokeless tobacco: Never  Vaping Use   Vaping Use: Never used  Substance and Sexual Activity   Alcohol use: No   Drug use: Not Currently    Types: Marijuana   Sexual activity: Yes    Birth control/protection: None  Other Topics Concern   Not on file  Social History Narrative   Not on file   Social Determinants of Health   Financial Resource Strain: Not on file  Food Insecurity: Not on file  Transportation Needs: Not on file  Physical Activity: Not on file  Stress: Not on file  Social Connections: Not on file   Family History  Problem Relation Age of Onset   Diabetes Mother    Heart failure Mother    Heart attack Mother 60   Heart disease Mother     Alcohol abuse Mother    Arthritis Mother    COPD Mother    Depression Mother    Drug abuse Mother    Hyperlipidemia Mother    Hypertension Mother    Intellectual disability Mother    Healthy Son    Alcohol abuse Father    Arthritis Father    Depression Father    Intellectual disability Father    Early death Father    Alcohol abuse Sister    Arthritis Sister    Depression Sister    Intellectual disability Sister    Intellectual disability Maternal Grandmother    Hypertension Maternal  Grandmother    Hyperlipidemia Maternal Grandmother    Heart disease Maternal Grandmother    Diabetes Maternal Grandmother    Depression Maternal Grandmother    COPD Maternal Grandmother    Alcohol abuse Maternal Grandfather    Arthritis Maternal Grandfather    COPD Maternal Grandfather    Diabetes Maternal Grandfather    Heart attack Maternal Grandfather    Heart disease Maternal Grandfather    Hyperlipidemia Maternal Grandfather    Hypertension Maternal Grandfather    Intellectual disability Maternal Grandfather    Arthritis Sister    Past Surgical History:  Procedure Laterality Date   CARDIAC CATHETERIZATION N/A 07/01/2016   Procedure: LEFT HEART CATH AND CORONARY ANGIOGRAPHY;  Surgeon: Burnell Blanks, MD;  Location: [facility];  Service: Cardiovascular;  Laterality: N/A;   CARDIAC CATHETERIZATION N/A 07/01/2016   Procedure: Coronary Stent Intervention;  Surgeon: Burnell Blanks, MD;  Location: [facility];  Service: Cardiovascular;  Laterality: N/A;   CARPAL TUNNEL RELEASE Bilateral    CERVICAL BIOPSY  W/ LOOP ELECTRODE EXCISION  05/2016   "precancerous cells"   CERVICAL BIOPSY  W/ LOOP ELECTRODE EXCISION     CORONARY ANGIOPLASTY WITH STENT PLACEMENT  07/01/2016   TONSILLECTOMY         Vanessa Kick, MD 02/22/22 1114

## 2022-03-12 ENCOUNTER — Telehealth: Payer: Medicare Other | Admitting: Nurse Practitioner

## 2022-03-12 DIAGNOSIS — B719 Cestode infection, unspecified: Secondary | ICD-10-CM

## 2022-03-12 DIAGNOSIS — J449 Chronic obstructive pulmonary disease, unspecified: Secondary | ICD-10-CM | POA: Diagnosis not present

## 2022-03-12 MED ORDER — PRAZIQUANTEL 600 MG PO TABS: ORAL_TABLET | ORAL | 0 refills | Status: DC

## 2022-03-12 NOTE — Progress Notes (Signed)
Virtual Visit Consent   Kaitlyn Chambers, you are scheduled for a virtual visit with Mary-Margaret Hassell Done, [place], a [facility] provider, today.     Just as with appointments in the office, your consent must be obtained to participate.  Your consent will be active for this visit and any virtual visit you may have with one of our providers in the next 365 days.     If you have a MyChart account, a copy of this consent can be sent to you electronically.  All virtual visits are billed to your insurance company just like a traditional visit in the office.    As this is a virtual visit, video technology does not allow for your provider to perform a traditional examination.  This may limit your provider's ability to fully assess your condition.  If your provider identifies any concerns that need to be evaluated in person or the need to arrange testing (such as labs, EKG, etc.), we will make arrangements to do so.     Although advances in technology are sophisticated, we cannot ensure that it will always work on either your end or our end.  If the connection with a video visit is poor, the visit may have to be switched to a telephone visit.  With either a video or telephone visit, we are not always able to ensure that we have a secure connection.     I need to obtain your verbal consent now.   Are you willing to proceed with your visit today? YES   Taran Hable Eyer[place] has provided verbal consent on 03/12/2022 for a virtual visit (video or telephone).   Mary-Margaret Hassell Done, FNP   Date: 03/12/2022 7:02 PM   Virtual Visit via Video Note   I, Mary-Margaret Hassell Done, connected with Kaitlyn Chambers ([id number], 1981/04/22) on 03/12/22 at  7:00 PM EDT by a video-enabled telemedicine application and verified that I am speaking with the correct person using two identifiers.  Location: Patient: Virtual Visit Location Patient: Home Provider: Virtual Visit Location Provider: Mobile   I discussed the limitations of  evaluation and management by telemedicine and the availability of in person appointments. The patient expressed understanding and agreed to proceed.    History of Present Illness: Kaitlyn Chambers is a 41 y.o. who identifies as a female who was assigned female at birth, and is being seen today for worms .  HPI: Patient had a bowel movement 2 days ago and saw string like things in her bowel movement. Today she saw tape worms in her stools. No abdominal pain. No blood in stool.  Looked like tape worms not pinworms    Review of Systems  Constitutional:  Negative for diaphoresis and weight loss.  Eyes:  Negative for blurred vision, double vision and pain.  Respiratory:  Negative for shortness of breath.   Cardiovascular:  Negative for chest pain, palpitations, orthopnea and leg swelling.  Gastrointestinal:  Negative for abdominal pain.  Skin:  Negative for rash.  Neurological:  Negative for dizziness, sensory change, loss of consciousness, weakness and headaches.  Endo/Heme/Allergies:  Negative for polydipsia. Does not bruise/bleed easily.  Psychiatric/Behavioral:  Negative for memory loss. The patient does not have insomnia.   All other systems reviewed and are negative.   Problems:  Patient Active Problem List   Diagnosis Date Noted   PAD (peripheral artery disease) ([place]) 02/16/2021   Chronic venous insufficiency 09/17/2019   Dyslipidemia 03/15/2017   Leukocytosis 03/15/2017   CAD (coronary artery  disease) 03/15/2017   Diabetes mellitus ([place])    Morbid obesity ([place]) 07/01/2016   PCOS (polycystic ovarian syndrome) 07/01/2016   Hypertension 07/01/2016   Bipolar 1 disorder ([place])    NSTEMI (non-ST elevated myocardial infarction) ([place])    Chronic insomnia 01/31/2013    Allergies:  Allergies  Allergen Reactions   Latex Swelling   Levaquin [Levofloxacin] Rash   Metoprolol Nausea And Vomiting and Other (See Comments)    Sweats and dizziness   Tape Rash    Tears skin    Medications:  Current Outpatient Medications:    aspirin EC 81 MG EC tablet, Take 1 tablet (81 mg total) by mouth daily., Disp: 30 tablet, Rfl: 0   atorvastatin (LIPITOR) 80 MG tablet, Take 80 mg by mouth daily., Disp: , Rfl:    Blood Glucose Monitoring Suppl (ONE TOUCH ULTRA 2) w/Device KIT, 1 kit by Does not apply route daily., Disp: 1 kit, Rfl: 0   colchicine 0.6 MG tablet, Take 2 tabs at onset of pain, then may repeat one dose in 2 hours if not resolving. Repeat daily until resolved. Max of 3 tabs daily, Disp: 20 tablet, Rfl: 0   doxycycline (VIBRAMYCIN) 100 MG capsule, Take 1 capsule (100 mg total) by mouth 2 (two) times daily., Disp: 14 capsule, Rfl: 0   empagliflozin (JARDIANCE) 25 MG TABS tablet, Take 25 mg by mouth daily., Disp: , Rfl:    fenofibrate (TRICOR) 145 MG tablet, Take 1 tablet (145 mg total) by mouth daily., Disp: 90 tablet, Rfl: 3   FLUoxetine (PROZAC) 40 MG capsule, Take 1 capsule (40 mg total) by mouth at bedtime. Further refills will come from Psychiatry, Disp: 30 capsule, Rfl: 0   furosemide (LASIX) 40 MG tablet, Take 1 tablet (40 mg total) by mouth as needed., Disp: 30 tablet, Rfl: 0   glucose blood (ONETOUCH ULTRA) test strip, Check blood sugars once daily  before breakfast (fasting) Dx: E11.9, Disp: 100 each, Rfl: 12   irbesartan (AVAPRO) 150 MG tablet, Take 150 mg by mouth daily., Disp: , Rfl:    Lancets (FREESTYLE) lancets, Use as instructed, Disp: 100 each, Rfl: 12   Lurasidone HCl (LATUDA) 60 MG TABS, Take 1 tablet (60 mg total) by mouth daily., Disp: 90 tablet, Rfl: 1   MAGNESIUM PO, Take 1 tablet by mouth daily., Disp: , Rfl:    megestrol (MEGACE) 40 MG tablet, Take 2 tablets (80 mg total) by mouth daily., Disp: 10 tablet, Rfl: 0   metroNIDAZOLE (FLAGYL) 500 MG tablet, Take 1 tablet (500 mg total) by mouth 2 (two) times daily with a meal. DO NOT CONSUME ALCOHOL WHILE TAKING THIS MEDICATION., Disp: 14 tablet, Rfl: 0   nitroGLYCERIN (NITROSTAT) 0.4 MG SL tablet,  Place 0.4 mg under the tongue every 5 (five) minutes as needed for chest pain., Disp: , Rfl:    potassium chloride SA (KLOR-CON) 20 MEQ tablet, Take 20 mEq by mouth daily as needed (cramps)., Disp: , Rfl:    prazosin (MINIPRESS) 1 MG capsule, Take 2 mg by mouth at bedtime., Disp: , Rfl:    Semaglutide, 1 MG/DOSE, (OZEMPIC, 1 MG/DOSE,) 2 MG/1.5ML SOPN, Inject 1 mg into the skin every Tuesday., Disp: , Rfl:    Simethicone (GAS-X PO), Take 2 tablets by mouth daily as needed (gas)., Disp: , Rfl:   Observations/Objective: Patient is well-developed, well-nourished in no acute distress.  Resting comfortably  at home.  Head is normocephalic, atraumatic.  No labored breathing.  Speech is clear and coherent with  logical content.  Patient is alert and oriented at baseline.    Assessment and Plan:  [name] in today with chief complaint of No chief complaint on file.   1. Tapeworm infection Watch for infestation Repeat pills in 10 days   Follow Up Instructions: I discussed the assessment and treatment plan with the patient. The patient was provided an opportunity to ask questions and all were answered. The patient agreed with the plan and demonstrated an understanding of the instructions.  A copy of instructions were sent to the patient via MyChart.  The patient was advised to call back or seek an in-person evaluation if the symptoms worsen or if the condition fails to improve as anticipated.  Time:  I spent 11 minutes with the patient via telehealth technology discussing the above problems/concerns.    Mary-Margaret Hassell Done, FNP

## 2022-03-12 NOTE — Patient Instructions (Signed)
Tapeworm Infection Tapeworms are parasites that can live in your intestines. When eggs from a tapeworm are consumed, the eggs develop into a young tapeworm (larva) and eventually an adult tapeworm inside of the body. An adult tapeworm can grow very long and can live inside of a human for many years. Tapeworm infection is rare in the [place]. There are several types of tapeworms. Most tapeworm infections cause only mild symptoms and are limited to the intestines. One type of tapeworm, called a pork tapeworm (Taenia solium or T. solium), can cause a more serious infection (cysticercosis). In this type of infection, tapeworm larvae can spread through the body and form cysts in areas such as the eyes, skin, muscle tissue, heart, and brain. What are the causes? This condition is caused by: Eating beef, pork, or fish that is raw or has not been cooked well enough. Drinking water that is contaminated with tapeworm eggs. Eating food that is contaminated with tapeworm eggs. What increases the risk? You are more likely to develop this condition if: You live in or travel to places where livestock roam freely, such as rural, developing countries. You work with animals or are exposed to animals. You live in a household with someone who has a tapeworm infection. You eat food that has been prepared by someone with a tapeworm infection. What are the signs or symptoms? In some cases, there are no symptoms. If you have symptoms, they may include: Pain in the abdomen. Nausea. Loss of appetite. Weight loss. Worm segments in your stool (feces). Diarrhea. Symptoms of cysticercosis vary depending on where the cysts form in your body. Symptoms may include: Feeling lumps under your skin. Headache. Seizure. Confusion. Loss of vision. Muscle weakness. Balance problems. Eye pain. How is this diagnosed? This condition may be diagnosed based on: Your symptoms. Your travel history. Your diet  history. Blood tests. Stool tests. Tests on the fluid that surrounds the brain and spinal cord (cerebrospinal fluid, CSF). The fluid is removed with a needle during a spinal tap (lumbar puncture). Imaging tests, such as a CT scan or MRI. How is this treated? Treatment for this condition depends on the type of tapeworm and your symptoms. Most often, this condition is treated with antiparasitic medicine to kill the tapeworms. Treatment for cysticercosis depends on the number of cysts and the symptoms you have. Treatment may include: Medicines to: Kill tapeworm larvae or eggs. Decrease swelling (steroids). Prevent seizures (anti-epileptics). Surgery to remove cysts. Follow these instructions at home:  Take over-the-counter and prescription medicines only as told by your health care provider. If you were prescribed an antiparasitic medicine, take it as told by your health care provider. Do not stop taking the antiparasitic even if you start to feel better. Keep all follow-up visits as told by your health care provider. This is important. Wash your hands often with soap and water. If soap and water are not available, use hand sanitizer. How is this prevented? Do not eat raw or undercooked fish or meat. Cook fish and meat according to food safety guidelines. Use a meat thermometer to make sure that fish and meat are cooked to the recommended temperatures. Wash your hands with soap and water: After you use the toilet. Before you handle or prepare food. After you handle or prepare food. Freeze meat for 12 hours before cooking it to help prevent tapeworm infection. Only eat raw fish (such as sushi) that has been previously frozen. Before you eat raw fruits and vegetables: Wash them  in boiled, bottled, or treated water. Peel them. When traveling in developing countries: Make sure you only drink water that is bottled or treated. Do not eat or drink anything that may be contaminated, including  beverages with ice cubes that may have been made from unboiled or untreated water. Do not eat raw foods that have been washed in unboiled tap water. It is safe to drink bottled or canned beverages, such as carbonated beverages, teas, pasteurized fruit drinks, or steaming hot beverages. Contact a health care provider if you: Still have symptoms of tapeworm infection after your treatment is complete. Develop any new symptoms. Get help right away if you: Feel light-headed or you faint. Have a seizure. Have sudden vision loss. Become confused. Summary Tapeworms are parasites that can live in your intestines. Tapeworms can live in a human body for many years. Tapeworms develop from tapeworm eggs that are consumed. This can occur from eating beef, pork, or fish that is raw or has not been cooked well enough, or from eating food or drinking water that is contaminated with tapeworm eggs. Most tapeworm infections cause only mild symptoms and are limited to the intestines. One type of tapeworm can cause a more serious infection (cysticercosis) where the larvae spread through the body and form cysts. Most often, this condition is treated with antiparasitic medicine to kill the tapeworms. This information is not intended to replace advice given to you by your health care provider. Make sure you discuss any questions you have with your health care provider. Document Revised: 01/08/2021 Document Reviewed: 01/08/2021 Elsevier Patient Education  [place].

## 2022-04-11 DIAGNOSIS — J449 Chronic obstructive pulmonary disease, unspecified: Secondary | ICD-10-CM | POA: Diagnosis not present

## 2022-05-12 DIAGNOSIS — J449 Chronic obstructive pulmonary disease, unspecified: Secondary | ICD-10-CM | POA: Diagnosis not present

## 2022-05-26 ENCOUNTER — Emergency Department (HOSPITAL_COMMUNITY)
Admission: EM | Admit: 2022-05-26 | Discharge: 2022-05-26 | Disposition: A | Discharge status: Home / Self Care | Payer: Medicare Other | Attending: Emergency Medicine | Admitting: Emergency Medicine

## 2022-05-26 DIAGNOSIS — Z91040 Latex allergy status: Secondary | ICD-10-CM | POA: Insufficient documentation

## 2022-05-26 DIAGNOSIS — Z794 Long term (current) use of insulin: Secondary | ICD-10-CM | POA: Diagnosis not present

## 2022-05-26 DIAGNOSIS — I10 Essential (primary) hypertension: Secondary | ICD-10-CM | POA: Diagnosis not present

## 2022-05-26 DIAGNOSIS — F419 Anxiety disorder, unspecified: Secondary | ICD-10-CM | POA: Insufficient documentation

## 2022-05-26 DIAGNOSIS — R52 Pain, unspecified: Secondary | ICD-10-CM | POA: Diagnosis not present

## 2022-05-26 DIAGNOSIS — R569 Unspecified convulsions: Secondary | ICD-10-CM | POA: Diagnosis not present

## 2022-05-26 DIAGNOSIS — Z743 Need for continuous supervision: Secondary | ICD-10-CM | POA: Diagnosis not present

## 2022-05-26 DIAGNOSIS — Z7982 Long term (current) use of aspirin: Secondary | ICD-10-CM | POA: Diagnosis not present

## 2022-05-26 DIAGNOSIS — R0689 Other abnormalities of breathing: Secondary | ICD-10-CM | POA: Diagnosis not present

## 2022-05-26 DIAGNOSIS — R079 Chest pain, unspecified: Secondary | ICD-10-CM | POA: Diagnosis not present

## 2022-05-26 LAB — CBC WITH DIFFERENTIAL/PLATELET
Abs Immature Granulocytes: 0.01 10*3/uL (ref 0.00–0.07)
Basophils Absolute: 0 10*3/uL (ref 0.0–0.1)
Basophils Relative: 1 %
Eosinophils Absolute: 0.1 10*3/uL (ref 0.0–0.5)
Eosinophils Relative: 1 %
HCT: 42.8 % (ref 36.0–46.0)
Hemoglobin: 14.8 g/dL (ref 12.0–15.0)
Immature Granulocytes: 0 %
Lymphocytes Relative: 40 %
Lymphs Abs: 3.1 10*3/uL (ref 0.7–4.0)
MCH: 30.6 pg (ref 26.0–34.0)
MCHC: 34.6 g/dL (ref 30.0–36.0)
MCV: 88.4 fL (ref 80.0–100.0)
Monocytes Absolute: 0.4 10*3/uL (ref 0.1–1.0)
Monocytes Relative: 6 %
Neutro Abs: 4 10*3/uL (ref 1.7–7.7)
Neutrophils Relative %: 52 %
Platelets: 253 10*3/uL (ref 150–400)
RBC: 4.84 MIL/uL (ref 3.87–5.11)
RDW: 12.8 % (ref 11.5–15.5)
WBC: 7.7 10*3/uL (ref 4.0–10.5)
nRBC: 0 % (ref 0.0–0.2)

## 2022-05-26 LAB — BASIC METABOLIC PANEL
Anion gap: 6 (ref 5–15)
BUN: 12 mg/dL (ref 6–20)
CO2: 26 mmol/L (ref 22–32)
Calcium: 8.9 mg/dL (ref 8.9–10.3)
Chloride: 105 mmol/L (ref 98–111)
Creatinine, Ser: 0.79 mg/dL (ref 0.44–1.00)
GFR, Estimated: >60 mL/min (ref >60)
Glucose, Bld: 113 mg/dL — ABNORMAL HIGH (ref 70–99)
Potassium: 3.5 mmol/L (ref 3.5–5.1)
Sodium: 137 mmol/L (ref 135–145)

## 2022-05-26 MED ORDER — AMLODIPINE BESYLATE 5 MG PO TABS: 5.0000 mg | ORAL_TABLET | Freq: Every day | ORAL | 0 refills | Status: DC

## 2022-05-26 MED ORDER — LORAZEPAM 1 MG PO TABS
1.0000 mg | ORAL_TABLET | Freq: Once | ORAL | Status: AC
  Administered 2022-05-26: 1 mg via ORAL
  Filled 2022-05-26: qty 1

## 2022-05-26 MED ORDER — FLUOXETINE HCL 40 MG PO CAPS: 40.0000 mg | ORAL_CAPSULE | Freq: Every day | ORAL | 1 refills | Status: DC

## 2022-05-26 NOTE — Discharge Instructions (Signed)
Follow-up with your family doctor next couple weeks.  Return if problems

## 2022-05-26 NOTE — ED Triage Notes (Signed)
BIB EMS- pt has been under stress for the past few days, had what seems to be a panic attack at home. States has had trouble with son recently and has been under a lot of stress with holidays, etc. States has not taken morning meds yet, EMS reported high BP but was going down while en route. Pt reports she may have a UTI as well

## 2022-05-26 NOTE — ED Provider Notes (Signed)
[facility] Provider Note   CSN: [id number] Arrival date & time: 05/26/22  0071     History  Chief Complaint  Patient presents with   Anxiety    Kaitlyn Chambers is a 41 y.o. female.  Patient has not been taking her Prozac for her blood pressure medicine.  She presents with anxiety.  She is trying to get a primary care doctor  The history is provided by the patient and medical records. No language interpreter was used.  Anxiety This is a recurrent problem. The current episode started 2 days ago. The problem occurs constantly. The problem has not changed since onset.Pertinent negatives include no chest pain, no abdominal pain and no headaches. Nothing aggravates the symptoms. Nothing relieves the symptoms.       Home Medications Prior to Admission medications   Medication Sig Start Date End Date Taking? Authorizing Provider  amLODipine (NORVASC) 5 MG tablet Take 1 tablet (5 mg total) by mouth daily. 05/26/22  Yes Milton Ferguson, MD  FLUoxetine (PROZAC) 40 MG capsule Take 1 capsule (40 mg total) by mouth daily. 05/26/22  Yes Milton Ferguson, MD  aspirin EC 81 MG EC tablet Take 1 tablet (81 mg total) by mouth daily. 07/04/16   Hongalgi, Lenis Dickinson, MD  atorvastatin (LIPITOR) 80 MG tablet Take 80 mg by mouth daily.    [provider]  Blood Glucose Monitoring Suppl (ONE TOUCH ULTRA 2) w/Device KIT 1 kit by Does not apply route daily. 08/09/19   Brunetta Jeans, PA-C  colchicine 0.6 MG tablet Take 2 tabs at onset of pain, then may repeat one dose in 2 hours if not resolving. Repeat daily until resolved. Max of 3 tabs daily 01/20/22   Volney American, PA-C  doxycycline (VIBRAMYCIN) 100 MG capsule Take 1 capsule (100 mg total) by mouth 2 (two) times daily. 10/06/21   Jaynee Eagles, PA-C  empagliflozin (JARDIANCE) 25 MG TABS tablet Take 25 mg by mouth daily.    [provider]  fenofibrate (TRICOR) 145 MG tablet Take 1 tablet (145 mg total) by mouth  daily. 07/19/19   Jerline Pain, MD  furosemide (LASIX) 40 MG tablet Take 1 tablet (40 mg total) by mouth as needed. 02/17/22   Vanessa Kick, MD  glucose blood (ONETOUCH ULTRA) test strip Check blood sugars once daily  before breakfast (fasting) Dx: E11.9 08/15/19   Brunetta Jeans, PA-C  irbesartan (AVAPRO) 150 MG tablet Take 150 mg by mouth daily.    [provider]  Lancets (FREESTYLE) lancets Use as instructed 08/07/19   Brunetta Jeans, PA-C  Lurasidone HCl (LATUDA) 60 MG TABS Take 1 tablet (60 mg total) by mouth daily. 08/13/19   Brunetta Jeans, PA-C  MAGNESIUM PO Take 1 tablet by mouth daily.    [provider]  megestrol (MEGACE) 40 MG tablet Take 2 tablets (80 mg total) by mouth daily. 10/06/21   Jaynee Eagles, PA-C  metroNIDAZOLE (FLAGYL) 500 MG tablet Take 1 tablet (500 mg total) by mouth 2 (two) times daily with a meal. DO NOT CONSUME ALCOHOL WHILE TAKING THIS MEDICATION. 10/07/21   Jaynee Eagles, PA-C  nitroGLYCERIN (NITROSTAT) 0.4 MG SL tablet Place 0.4 mg under the tongue every 5 (five) minutes as needed for chest pain.    [provider]  potassium chloride SA (KLOR-CON) 20 MEQ tablet Take 20 mEq by mouth daily as needed (cramps).    [provider]  praziquantel (BILTRICIDE) 600 MG tablet 5.5 tabkets  now and repeat in 10 days 03/12/22   Chevis Pretty, FNP  prazosin (MINIPRESS) 1 MG capsule Take 2 mg by mouth at bedtime.    [provider]  Semaglutide, 1 MG/DOSE, (OZEMPIC, 1 MG/DOSE,) 2 MG/1.5ML SOPN Inject 1 mg into the skin every Tuesday.    [provider]  Simethicone (GAS-X PO) Take 2 tablets by mouth daily as needed (gas).    [provider]  gabapentin (NEURONTIN) 300 MG capsule Take 300 mg by mouth 2 (two) times daily.  04/30/19  [provider]  temazepam (RESTORIL) 30 MG capsule Take 30 mg by mouth at bedtime.  01/31/18 04/30/19  [provider]  VYVANSE 70 MG capsule Take 70 mg by mouth daily.   01/31/18 03/13/19  [provider]      Allergies    Latex, Levaquin [levofloxacin], Metoprolol, and Tape    Review of Systems   Review of Systems  Constitutional:  Negative for appetite change and fatigue.  HENT:  Negative for congestion, ear discharge and sinus pressure.   Eyes:  Negative for discharge.  Respiratory:  Negative for cough.   Cardiovascular:  Negative for chest pain.  Gastrointestinal:  Negative for abdominal pain and diarrhea.  Genitourinary:  Negative for frequency and hematuria.  Musculoskeletal:  Negative for back pain.  Skin:  Negative for rash.  Neurological:  Negative for seizures and headaches.  Psychiatric/Behavioral:  Negative for hallucinations.     Physical Exam Updated Vital Signs BP (!) 167/97   Pulse 87   Temp 98.4 F (36.9 C) (Oral)   Resp 20   Ht _0  (1.702 m)   Wt 125.2 kg   SpO2 100%   BMI 43.23 kg/m  Physical Exam Vitals and nursing note reviewed.  Constitutional:      Appearance: She is well-developed.  HENT:     Head: Normocephalic.     Nose: Nose normal.  Eyes:     General: No scleral icterus.    Conjunctiva/sclera: Conjunctivae normal.  Neck:     Thyroid: No thyromegaly.  Cardiovascular:     Rate and Rhythm: Normal rate and regular rhythm.     Heart sounds: No murmur heard.    No friction rub. No gallop.  Pulmonary:     Breath sounds: No stridor. No wheezing or rales.  Chest:     Chest wall: No tenderness.  Abdominal:     General: There is no distension.     Tenderness: There is no abdominal tenderness. There is no rebound.  Musculoskeletal:        General: Normal range of motion.     Cervical back: Neck supple.  Lymphadenopathy:     Cervical: No cervical adenopathy.  Skin:    Findings: No erythema or rash.  Neurological:     Mental Status: She is alert and oriented to person, place, and time.     Motor: No abnormal muscle tone.     Coordination: Coordination normal.  Psychiatric:     Comments:  anxiety     ED Results / Procedures / Treatments   Labs (all labs ordered are listed, but only abnormal results are displayed) Labs Reviewed  BASIC METABOLIC PANEL - Abnormal; Notable for the following components:      Result Value   Glucose, Bld 113 (*)    All other components within normal limits  CBC WITH DIFFERENTIAL/PLATELET    EKG None  Radiology No results found.  Procedures Procedures    Medications Ordered in  ED Medications  LORazepam (ATIVAN) tablet 1 mg (1 mg Oral Given 05/26/22 1035)    ED Course/ Medical Decision Making/ A&P                           Medical Decision Making Amount and/or Complexity of Data Reviewed Labs: ordered.  Risk Prescription drug management.  This patient presents to the ED for concern of chest pain and anxiety, this involves an extensive number of treatment options, and is a complaint that carries with it a high risk of complications and morbidity.  The differential diagnosis includes noncardiac chest pain   Co morbidities that complicate the patient evaluation  Anxiety   Additional history obtained:  Additional history obtained from patient External records from outside source obtained and reviewed including hospital records   Lab Tests:  I Ordered, and personally interpreted labs.  The pertinent results include: CBC normal and chemistries normal   Imaging Studies ordered:  No imaging  Cardiac Monitoring: / EKG:  The patient was maintained on a cardiac monitor.  I personally viewed and interpreted the cardiac monitored which showed an underlying rhythm of: Normal sinus rhythm   Consultations Obtained:  No consultant  Problem List / ED Course / Critical interventions / Medication management  Anxiety and chest pain I ordered medication including Ativan for anxiety Reevaluation of the patient after these medicines showed that the patient improved I have reviewed the patients home medicines and have made  adjustments as needed   Social Determinants of Health:  None   Test / Admission - Considered:  None  Patient with anxiety and hypertension.  Will start her on Norvasc and put her back on her Prozac        Final Clinical Impression(s) / ED Diagnoses Final diagnoses:  Anxiety    Rx / DC Orders ED Discharge Orders          Ordered    FLUoxetine (PROZAC) 40 MG capsule  Daily        05/26/22 1243    amLODipine (NORVASC) 5 MG tablet  Daily        05/26/22 1243              Milton Ferguson, MD 05/28/22 [phone]

## 2022-06-11 DIAGNOSIS — J449 Chronic obstructive pulmonary disease, unspecified: Secondary | ICD-10-CM | POA: Diagnosis not present

## 2022-07-12 DIAGNOSIS — J449 Chronic obstructive pulmonary disease, unspecified: Secondary | ICD-10-CM | POA: Diagnosis not present

## 2022-07-26 ENCOUNTER — Ambulatory Visit (INDEPENDENT_AMBULATORY_CARE_PROVIDER_SITE_OTHER): Payer: 59

## 2022-07-26 ENCOUNTER — Ambulatory Visit
Admission: EM | Admit: 2022-07-26 | Discharge: 2022-07-26 | Disposition: A | Discharge status: Home / Self Care | Payer: 59 | Attending: Family Medicine | Admitting: Family Medicine

## 2022-07-26 ENCOUNTER — Other Ambulatory Visit: Payer: Self-pay

## 2022-07-26 ENCOUNTER — Encounter: Payer: Self-pay | Admitting: Emergency Medicine

## 2022-07-26 DIAGNOSIS — I872 Venous insufficiency (chronic) (peripheral): Secondary | ICD-10-CM

## 2022-07-26 DIAGNOSIS — S92504A Nondisplaced unspecified fracture of right lesser toe(s), initial encounter for closed fracture: Secondary | ICD-10-CM

## 2022-07-26 DIAGNOSIS — Z0389 Encounter for observation for other suspected diseases and conditions ruled out: Secondary | ICD-10-CM | POA: Diagnosis not present

## 2022-07-26 NOTE — Discharge Instructions (Signed)
Follow-up with [facility] as well as your vascular specialist as soon as possible.  Wear the postop shoe when walking to help assist with healing of the toe.

## 2022-07-26 NOTE — ED Triage Notes (Signed)
Pt reports right little toe pain x2 months. Pt reports "I thought I stubbed it but I'm diabetic and my toe keeps getting black." Purple discoloration and intermittent numbness since injury.

## 2022-07-26 NOTE — ED Provider Notes (Signed)
[facility]    CSN: [id number] Arrival date & time: 07/26/22  1629      History   Chief Complaint Chief Complaint  Patient presents with   Toe Pain    HPI DELIGHT BICKLE is a 42 y.o. female.   Presenting today with almost 2 months of pain, discoloration to the right little toe after stubbing the toe.  States she has severe peripheral vascular disease and diabetes with chronic neuropathy in the feet, occasionally having numbness and occasionally having severe pain in the area.  Denies loss of range of motion, inability to bear weight.  So far not tried anything over-the-counter for symptoms.  Is followed by vascular specialist but states her insurance changed and she is waiting to get in with someone that is now covered in her network.    Past Medical History:  Diagnosis Date   Anxiety    Bipolar 1 disorder, manic, full remission (HCC)    CHF (congestive heart failure) (HCC)    COPD (chronic obstructive pulmonary disease) (HCC)    Coronary artery disease    Depression    GERD (gastroesophageal reflux disease)    Gout    High cholesterol    Hypertension    Hypothyroidism    Metabolic syndrome    NSTEMI (non-ST elevated myocardial infarction) ([place]) 07/01/2016   On home oxygen therapy    "available; don't use it" (07/01/2016)   PCOS (polycystic ovarian syndrome)    Stomach ulcer    Type 2 diabetes, diet controlled ([place])     Patient Active Problem List   Diagnosis Date Noted   PAD (peripheral artery disease) ([place]) 02/16/2021   Chronic venous insufficiency 09/17/2019   Dyslipidemia 03/15/2017   Leukocytosis 03/15/2017   CAD (coronary artery disease) 03/15/2017   Diabetes mellitus ([place])    Morbid obesity ([place]) 07/01/2016   PCOS (polycystic ovarian syndrome) 07/01/2016   Hypertension 07/01/2016   Bipolar 1 disorder ([place])    NSTEMI (non-ST elevated myocardial infarction) ([place])    Chronic insomnia 01/31/2013    Past Surgical History:  Procedure  Laterality Date   CARDIAC CATHETERIZATION N/A 07/01/2016   Procedure: LEFT HEART CATH AND CORONARY ANGIOGRAPHY;  Surgeon: Burnell Blanks, MD;  Location: [facility];  Service: Cardiovascular;  Laterality: N/A;   CARDIAC CATHETERIZATION N/A 07/01/2016   Procedure: Coronary Stent Intervention;  Surgeon: Burnell Blanks, MD;  Location: [facility];  Service: Cardiovascular;  Laterality: N/A;   CARPAL TUNNEL RELEASE Bilateral    CERVICAL BIOPSY  W/ LOOP ELECTRODE EXCISION  05/2016   "precancerous cells"   CERVICAL BIOPSY  W/ LOOP ELECTRODE EXCISION     CORONARY ANGIOPLASTY WITH STENT PLACEMENT  07/01/2016   TONSILLECTOMY      OB History     Gravida  1   Para      Term  0   Preterm  0   AB  1   Living  0      SAB  1   IAB  0   Ectopic  0   Multiple  0   Live Births               Home Medications    Prior to Admission medications   Medication Sig Start Date End Date Taking? Authorizing Provider  amLODipine (NORVASC) 5 MG tablet Take 1 tablet (5 mg total) by mouth daily. 05/26/22   Milton Ferguson, MD  aspirin EC 81 MG EC tablet Take 1 tablet (  81 mg total) by mouth daily. 07/04/16   Hongalgi, Lenis Dickinson, MD  atorvastatin (LIPITOR) 80 MG tablet Take 80 mg by mouth daily.    [provider]  Blood Glucose Monitoring Suppl (ONE TOUCH ULTRA 2) w/Device KIT 1 kit by Does not apply route daily. 08/09/19   Brunetta Jeans, PA-C  colchicine 0.6 MG tablet Take 2 tabs at onset of pain, then may repeat one dose in 2 hours if not resolving. Repeat daily until resolved. Max of 3 tabs daily 01/20/22   Volney American, PA-C  doxycycline (VIBRAMYCIN) 100 MG capsule Take 1 capsule (100 mg total) by mouth 2 (two) times daily. 10/06/21   Jaynee Eagles, PA-C  empagliflozin (JARDIANCE) 25 MG TABS tablet Take 25 mg by mouth daily.    [provider]  fenofibrate (TRICOR) 145 MG tablet Take 1 tablet (145 mg total) by mouth daily. 07/19/19   Jerline Pain, MD  FLUoxetine (PROZAC) 40 MG capsule Take 1 capsule (40 mg total) by mouth daily. 05/26/22   Milton Ferguson, MD  furosemide (LASIX) 40 MG tablet Take 1 tablet (40 mg total) by mouth as needed. 02/17/22   Vanessa Kick, MD  glucose blood (ONETOUCH ULTRA) test strip Check blood sugars once daily  before breakfast (fasting) Dx: E11.9 08/15/19   Brunetta Jeans, PA-C  irbesartan (AVAPRO) 150 MG tablet Take 150 mg by mouth daily.    [provider]  Lancets (FREESTYLE) lancets Use as instructed 08/07/19   Brunetta Jeans, PA-C  Lurasidone HCl (LATUDA) 60 MG TABS Take 1 tablet (60 mg total) by mouth daily. 08/13/19   Brunetta Jeans, PA-C  MAGNESIUM PO Take 1 tablet by mouth daily.    [provider]  megestrol (MEGACE) 40 MG tablet Take 2 tablets (80 mg total) by mouth daily. 10/06/21   Jaynee Eagles, PA-C  metroNIDAZOLE (FLAGYL) 500 MG tablet Take 1 tablet (500 mg total) by mouth 2 (two) times daily with a meal. DO NOT CONSUME ALCOHOL WHILE TAKING THIS MEDICATION. 10/07/21   Jaynee Eagles, PA-C  nitroGLYCERIN (NITROSTAT) 0.4 MG SL tablet Place 0.4 mg under the tongue every 5 (five) minutes as needed for chest pain.    [provider]  potassium chloride SA (KLOR-CON) 20 MEQ tablet Take 20 mEq by mouth daily as needed (cramps).    [provider]  praziquantel (BILTRICIDE) 600 MG tablet 5.5 tabkets now and repeat in 10 days 03/12/22   Hassell Done, Mary-Margaret, FNP  prazosin (MINIPRESS) 1 MG capsule Take 2 mg by mouth at bedtime.    [provider]  Semaglutide, 1 MG/DOSE, (OZEMPIC, 1 MG/DOSE,) 2 MG/1.5ML SOPN Inject 1 mg into the skin every Tuesday.    [provider]  Simethicone (GAS-X PO) Take 2 tablets by mouth daily as needed (gas).    [provider]  gabapentin (NEURONTIN) 300 MG capsule Take 300 mg by mouth 2 (two) times daily.  04/30/19  [provider]  temazepam (RESTORIL) 30 MG capsule Take 30 mg by mouth at bedtime.  01/31/18  04/30/19  [provider]  VYVANSE 70 MG capsule Take 70 mg by mouth daily.  01/31/18 03/13/19  [provider]    Family History Family History  Problem Relation Age of Onset   Diabetes Mother    Heart failure Mother    Heart attack Mother 53   Heart disease Mother    Alcohol abuse Mother    Arthritis Mother    COPD Mother  Depression Mother    Drug abuse Mother    Hyperlipidemia Mother    Hypertension Mother    Intellectual disability Mother    Healthy Son    Alcohol abuse Father    Arthritis Father    Depression Father    Intellectual disability Father    Early death Father    Alcohol abuse Sister    Arthritis Sister    Depression Sister    Intellectual disability Sister    Intellectual disability Maternal Grandmother    Hypertension Maternal Grandmother    Hyperlipidemia Maternal Grandmother    Heart disease Maternal Grandmother    Diabetes Maternal Grandmother    Depression Maternal Grandmother    COPD Maternal Grandmother    Alcohol abuse Maternal Grandfather    Arthritis Maternal Grandfather    COPD Maternal Grandfather    Diabetes Maternal Grandfather    Heart attack Maternal Grandfather    Heart disease Maternal Grandfather    Hyperlipidemia Maternal Grandfather    Hypertension Maternal Grandfather    Intellectual disability Maternal Grandfather    Arthritis Sister     Social History Social History   Tobacco Use   Smoking status: Every Day    Packs/day: 0.25    Years: 26.00    Total pack years: 6.50    Types: Cigarettes   Smokeless tobacco: Never  Vaping Use   Vaping Use: Never used  Substance Use Topics   Alcohol use: No   Drug use: Not Currently    Types: Marijuana     Allergies   Latex, Levaquin [levofloxacin], Metoprolol, and Tape   Review of Systems Review of Systems PER HPI  Physical Exam Triage Vital Signs ED Triage Vitals  Enc Vitals Group     BP 07/26/22 1645 (!) 175/128     Pulse Rate 07/26/22 1645 (!)  102     Resp 07/26/22 1645 20     Temp 07/26/22 1645 98.2 F (36.8 C)     Temp Source 07/26/22 1645 Oral     SpO2 07/26/22 1645 98 %     Weight --      Height --      Head Circumference --      Peak Flow --      Pain Score 07/26/22 1647 8     Pain Loc --      Pain Edu? --      Excl. in [place]? --    No data found.  Updated Vital Signs BP (!) 175/128 (BP Location: Right Arm)   Pulse (!) 102   Temp 98.2 F (36.8 C) (Oral)   Resp 20   SpO2 98%   Visual Acuity Right Eye Distance:   Left Eye Distance:   Bilateral Distance:    Right Eye Near:   Left Eye Near:    Bilateral Near:     Physical Exam Vitals and nursing note reviewed.  Constitutional:      Appearance: Normal appearance. She is not ill-appearing.  HENT:     Head: Atraumatic.  Eyes:     Extraocular Movements: Extraocular movements intact.     Conjunctiva/sclera: Conjunctivae normal.  Cardiovascular:     Rate and Rhythm: Normal rate and regular rhythm.     Heart sounds: Normal heart sounds.     Comments: Mildly sluggish capillary refill throughout bilateral lower extremities which appears to be her baseline. Pulmonary:     Effort: Pulmonary effort is normal.     Breath sounds: Normal breath sounds.  Musculoskeletal:  General: Swelling, tenderness and signs of injury present. No deformity. Normal range of motion.     Cervical back: Normal range of motion and neck supple.     Comments: Bilateral feet edematous symmetrically, right little toe diffusely tender to palpation, discolored, edematous  Skin:    General: Skin is warm and dry.     Comments: Purpleish discoloration to the right fifth toe in the area of her pain, rubor throughout Lower legs and feet bilaterally  Neurological:     Mental Status: She is alert and oriented to person, place, and time.     Motor: No weakness.     Gait: Gait normal.     Comments: Decreased sensation to light touch bilaterally which she states is baseline for her   Psychiatric:        Mood and Affect: Mood normal.        Thought Content: Thought content normal.        Judgment: Judgment normal.    UC Treatments / Results  Labs (all labs ordered are listed, but only abnormal results are displayed) Labs Reviewed - No data to display  EKG   Radiology DG Toe 5th Right  Result Date: 07/26/2022 CLINICAL DATA:  A 42 year old female presents for evaluation of the RIGHT fifth digit. EXAM: RIGHT FIFTH TOE COMPARISON:  None available FINDINGS: Subtle lucency suggested at the base of the proximal phalanx of the fifth toe. Mild fullness of soft tissues overlying the metatarsal. No visible displaced fracture. Subtle irregularity of the base of the fifth metatarsal appears more consistent with prior injury to this area. IMPRESSION: 1. Question of nondisplaced fracture at the base of the proximal phalanx of the fifth toe. Correlate with point tenderness in this area. 2. Subtle irregularity of the base of the fifth metatarsal appears more consistent with prior injury to this area. Electronically Signed   By: Zetta Bills M.D.   On: 07/26/2022 17:17    Procedures Procedures (including critical care time)  Medications Ordered in UC Medications - No data to display  Initial Impression / Assessment and Plan / UC Course  I have reviewed the triage vital signs and the nursing notes.  Pertinent labs & imaging results that were available during my care of the patient were reviewed by me and considered in my medical decision making (see chart for details).     X-ray today showing a possible fracture of the fifth toe.  Suspect prolonged poor healing related to her poor vascularization and diabetes.  Podiatry and vascular surgery follow-up recommended, will place in a postop shoe and give work note today.  Return for worsening symptoms.  Final Clinical Impressions(s) / UC Diagnoses   Final diagnoses:  Closed nondisplaced fracture of phalanx of lesser toe of right  foot, unspecified phalanx, initial encounter  Chronic venous insufficiency     Discharge Instructions      Follow-up with [facility] as well as your vascular specialist as soon as possible.  Wear the postop shoe when walking to help assist with healing of the toe.    ED Prescriptions   None    PDMP not reviewed this encounter.   Volney American, [place] 07/26/22 1818

## 2022-08-12 DIAGNOSIS — J449 Chronic obstructive pulmonary disease, unspecified: Secondary | ICD-10-CM | POA: Diagnosis not present

## 2022-08-15 DIAGNOSIS — E119 Type 2 diabetes mellitus without complications: Secondary | ICD-10-CM | POA: Diagnosis not present

## 2022-09-10 DIAGNOSIS — J449 Chronic obstructive pulmonary disease, unspecified: Secondary | ICD-10-CM | POA: Diagnosis not present

## 2022-09-24 ENCOUNTER — Ambulatory Visit
Admission: EM | Admit: 2022-09-24 | Discharge: 2022-09-24 | Disposition: A | Discharge status: Home / Self Care | Payer: 59 | Attending: Family Medicine | Admitting: Family Medicine

## 2022-09-24 ENCOUNTER — Ambulatory Visit (INDEPENDENT_AMBULATORY_CARE_PROVIDER_SITE_OTHER): Payer: 59

## 2022-09-24 DIAGNOSIS — N10 Acute tubulo-interstitial nephritis: Secondary | ICD-10-CM | POA: Insufficient documentation

## 2022-09-24 DIAGNOSIS — N3090 Cystitis, unspecified without hematuria: Secondary | ICD-10-CM | POA: Diagnosis not present

## 2022-09-24 DIAGNOSIS — I10 Essential (primary) hypertension: Secondary | ICD-10-CM | POA: Insufficient documentation

## 2022-09-24 DIAGNOSIS — K219 Gastro-esophageal reflux disease without esophagitis: Secondary | ICD-10-CM | POA: Diagnosis not present

## 2022-09-24 DIAGNOSIS — M79671 Pain in right foot: Secondary | ICD-10-CM

## 2022-09-24 DIAGNOSIS — R112 Nausea with vomiting, unspecified: Secondary | ICD-10-CM

## 2022-09-24 DIAGNOSIS — R600 Localized edema: Secondary | ICD-10-CM | POA: Diagnosis not present

## 2022-09-24 DIAGNOSIS — R1013 Epigastric pain: Secondary | ICD-10-CM | POA: Diagnosis not present

## 2022-09-24 LAB — POCT URINALYSIS DIP (MANUAL ENTRY)
Bilirubin, UA: NEGATIVE
Blood, UA: NEGATIVE
Glucose, UA: NEGATIVE mg/dL
Nitrite, UA: POSITIVE — AB
Protein Ur, POC: 100 mg/dL — AB
Spec Grav, UA: 1.02 (ref 1.010–1.025)
Urobilinogen, UA: 1 E.U./dL
pH, UA: 7.5 (ref 5.0–8.0)

## 2022-09-24 LAB — POCT URINE PREGNANCY: Preg Test, Ur: NEGATIVE

## 2022-09-24 LAB — POCT FASTING CBG KUC MANUAL ENTRY: POCT Glucose (KUC): 238 mg/dL — AB (ref 70–99)

## 2022-09-24 MED ORDER — LIDOCAINE VISCOUS HCL 2 % MT SOLN
15.0000 mL | Freq: Once | OROMUCOSAL | Status: AC
Start: 2022-09-24 — End: 2022-09-24
  Administered 2022-09-24: 15 mL via ORAL

## 2022-09-24 MED ORDER — ONDANSETRON 4 MG PO TBDP: 4.0000 mg | ORAL_TABLET | Freq: Three times a day (TID) | ORAL | 0 refills | Status: DC | PRN

## 2022-09-24 MED ORDER — SODIUM CHLORIDE 0.9 % IV BOLUS
1000.0000 mL | Freq: Once | INTRAVENOUS | Status: AC
  Administered 2022-09-24: 1000 mL via INTRAVENOUS

## 2022-09-24 MED ORDER — CEPHALEXIN 500 MG PO CAPS: 500.0000 mg | ORAL_CAPSULE | Freq: Two times a day (BID) | ORAL | 0 refills | Status: DC

## 2022-09-24 MED ORDER — ONDANSETRON 4 MG PO TBDP
4.0000 mg | ORAL_TABLET | Freq: Once | ORAL | Status: AC
  Administered 2022-09-24: 4 mg via ORAL

## 2022-09-24 MED ORDER — ALUM & MAG HYDROXIDE-SIMETH 200-200-20 MG/5ML PO SUSP
30.0000 mL | Freq: Once | ORAL | Status: AC
Start: 2022-09-24 — End: 2022-09-24
  Administered 2022-09-24: 30 mL via ORAL

## 2022-09-24 MED ORDER — PANTOPRAZOLE SODIUM 20 MG PO TBEC: 20.0000 mg | DELAYED_RELEASE_TABLET | Freq: Every day | ORAL | 2 refills | Status: DC

## 2022-09-24 MED ORDER — CEFTRIAXONE SODIUM 1 G IJ SOLR
1.0000 g | Freq: Once | INTRAMUSCULAR | Status: AC
  Administered 2022-09-24: 1 g via INTRAMUSCULAR

## 2022-09-24 NOTE — ED Notes (Signed)
Rounding on patient. No needs at this time.

## 2022-09-24 NOTE — ED Triage Notes (Addendum)
Pt presents with c/o right fifth toe pain X 4 months. Pt stubbed her toe and waited a month to have it checked. States she consistently has cold sweats.  States she has not been able to eat or drink anything. Pt reports vomiting and having a fever.

## 2022-09-24 NOTE — Discharge Instructions (Addendum)
You have had labs (blood tests) sent today. We will call you with any significant abnormalities or if there is need to begin or change treatment or pursue further follow up.  You may also review your test results online through [facility]. If you do not have a MyChart account, instructions to sign up should be on your discharge paperwork.  Your blood pressure was noted to be elevated during your visit today. If you are currently taking medication for high blood pressure, please ensure you are taking this as directed. If you do not have a history of high blood pressure and your blood pressure remains persistently elevated, you may need to begin taking a medication at some point. You may return here within the next few days to recheck if unable to see your primary care provider or if you do not have a one.  BP (!) 161/101 (BP Location: Right Arm)   Pulse 82   Temp 98 F (36.7 C) (Oral)   Resp 18   LMP 09/06/2022 (Exact Date)   SpO2 97%   BP Readings from Last 3 Encounters:  09/24/22 (!) 161/101  07/26/22 (!) 175/128  05/26/22 (!) 177/84

## 2022-09-25 LAB — CBC WITH DIFFERENTIAL/PLATELET
Basophils Absolute: 0.1 10*3/uL (ref 0.0–0.2)
Basos: 0 %
EOS (ABSOLUTE): 0 10*3/uL (ref 0.0–0.4)
Eos: 0 %
Hematocrit: 47.3 % — ABNORMAL HIGH (ref 34.0–46.6)
Hemoglobin: 16.2 g/dL — ABNORMAL HIGH (ref 11.1–15.9)
Immature Grans (Abs): 0.1 10*3/uL (ref 0.0–0.1)
Immature Granulocytes: 1 %
Lymphocytes Absolute: 3.6 10*3/uL — ABNORMAL HIGH (ref 0.7–3.1)
Lymphs: 22 %
MCH: 30.3 pg (ref 26.6–33.0)
MCHC: 34.2 g/dL (ref 31.5–35.7)
MCV: 89 fL (ref 79–97)
Monocytes Absolute: 0.6 10*3/uL (ref 0.1–0.9)
Monocytes: 3 %
Neutrophils Absolute: 12.1 10*3/uL — ABNORMAL HIGH (ref 1.4–7.0)
Neutrophils: 74 %
Platelets: 334 10*3/uL (ref 150–450)
RBC: 5.34 x10E6/uL — ABNORMAL HIGH (ref 3.77–5.28)
RDW: 12.8 % (ref 11.7–15.4)
WBC: 16.4 10*3/uL — ABNORMAL HIGH (ref 3.4–10.8)

## 2022-09-25 LAB — COMPREHENSIVE METABOLIC PANEL
ALT: 19 IU/L (ref 0–32)
AST: 15 IU/L (ref 0–40)
Albumin/Globulin Ratio: 1.4 (ref 1.2–2.2)
Albumin: 4.5 g/dL (ref 3.9–4.9)
Alkaline Phosphatase: 91 IU/L (ref 44–121)
BUN/Creatinine Ratio: 11 (ref 9–23)
BUN: 8 mg/dL (ref 6–24)
Bilirubin Total: 0.5 mg/dL (ref 0.0–1.2)
CO2: 18 mmol/L — ABNORMAL LOW (ref 20–29)
Calcium: 9.7 mg/dL (ref 8.7–10.2)
Chloride: 97 mmol/L (ref 96–106)
Creatinine, Ser: 0.74 mg/dL (ref 0.57–1.00)
Globulin, Total: 3.3 g/dL (ref 1.5–4.5)
Glucose: 206 mg/dL — ABNORMAL HIGH (ref 70–99)
Potassium: 3.8 mmol/L (ref 3.5–5.2)
Sodium: 139 mmol/L (ref 134–144)
Total Protein: 7.8 g/dL (ref 6.0–8.5)
eGFR: 103 mL/min/{1.73_m2} (ref >59)

## 2022-09-26 LAB — URINE CULTURE: Culture: >=100000 — AB

## 2022-09-26 NOTE — ED Provider Notes (Addendum)
[place]   [id number] 09/24/22 Arrival Time: [id number]  ASSESSMENT & PLAN:  1. Acute pyelonephritis   2. Cystitis   3. Abdominal discomfort, epigastric   4. Nausea and vomiting, unspecified vomiting type   5. Elevated blood pressure reading with diagnosis of hypertension   6. Gastroesophageal reflux disease without esophagitis    Reports feeling much better after IVF. Rocephin given for likely pyelonephritis. To begin PO antibiotic asap. Is tolerating PO intake. Suspect element of GERD exacerbation. PPI prescribed. Zofran for nausea. Benign abdominal exam.  Understand ED is the next stop if she worsens in any way.  Labs pending: Orders Placed This Encounter  Procedures   Urine Culture   Comprehensive metabolic panel   Lipase, blood   CBC with Differential/Platelet   UPT negative. U/A with signs of infection.  Meds ordered this encounter  Medications   AND Linked Order Group    alum & mag hydroxide-simeth (MAALOX/MYLANTA) 200-200-20 MG/5ML suspension 30 mL    lidocaine (XYLOCAINE) 2 % viscous mouth solution 15 mL   cefTRIAXone (ROCEPHIN) injection 1 g   sodium chloride 0.9 % bolus 1,000 mL   ondansetron (ZOFRAN-ODT) disintegrating tablet 4 mg   cephALEXin (KEFLEX) 500 MG capsule    Sig: Take 1 capsule (500 mg total) by mouth 2 (two) times daily.    Dispense:  14 capsule    Refill:  0   pantoprazole (PROTONIX) 20 MG tablet    Sig: Take 1 tablet (20 mg total) by mouth daily.    Dispense:  30 tablet    Refill:  2   ondansetron (ZOFRAN-ODT) 4 MG disintegrating tablet    Sig: Take 1 tablet (4 mg total) by mouth every 8 (eight) hours as needed for nausea or vomiting.    Dispense:  15 tablet    Refill:  0     Discharge Instructions      You have had labs (blood tests) sent today. We will call you with any significant abnormalities or if there is need to begin or change treatment or pursue further follow up.  You may also review your test results online  through [place]. If you do not have a MyChart account, instructions to sign up should be on your discharge paperwork.  Your blood pressure was noted to be elevated during your visit today. If you are currently taking medication for high blood pressure, please ensure you are taking this as directed. If you do not have a history of high blood pressure and your blood pressure remains persistently elevated, you may need to begin taking a medication at some point. You may return here within the next few days to recheck if unable to see your primary care provider or if you do not have a one.  BP (!) 161/101 (BP Location: Right Arm)   Pulse 82   Temp 98 F (36.7 C) (Oral)   Resp 18   LMP 09/06/2022 (Exact Date)   SpO2 97%   BP Readings from Last 3 Encounters:  09/24/22 (!) 161/101  07/26/22 (!) 175/128  05/26/22 (!) 177/84   No s/s of HTN urgency.    Follow-up Information     [place] [facility] at [facility].   Specialty: Emergency Medicine Why: If symptoms worsen in any way. Contact information: [address] [id number] [place] [phone]               For her persistent toe pain, I recommend she see an  orthopaedist.  Reviewed with her today: DG Foot Complete Right  Result Date: 09/24/2022 CLINICAL DATA:  distal lateral pain x 4 weeks EXAM: RIGHT FOOT COMPLETE - 3+ VIEW COMPARISON:  July 26, 2022 FINDINGS: Visualization of subtle lucency of the base of the fifth metatarsal with cortical irregularity. This is not significantly changed in comparison to prior and may reflect the sequela of prior trauma versus chronic repetitive stress injury. Previously questioned fracture of the base of the fifth proximal phalanx is not discretely visualized on today's exam. No definitive acute fracture. Soft tissue edema of the forefoot. No unexpected radiopaque foreign body. IMPRESSION: 1. Chronic lucency and cortical irregularity of  the base of the fifth metatarsal may reflect sequela of remote prior trauma versus stress fracture. Recommend correlation with point tenderness. Outpatient MRI could be of utility for further characterization. 2. Soft tissue edema of the forefoot. No definitive acute fracture. Electronically Signed   By: Valentino Saxon M.D.   On: 09/24/2022 10:02    Reviewed expectations re: course of current medical issues. Questions answered. Outlined signs and symptoms indicating need for more acute intervention. Patient verbalized understanding. After Visit Summary given.   SUBJECTIVE: History from: patient. Kaitlyn Chambers is a 42 y.o. female who presents with complaint of  "just feeling bad" for a few days. Mild flank discomfort. No specific urinary symptoms. Nausea without emesis. Denies diarrhea. Tolerating PO fluids. No specific urinary symptoms. Ques subj fever today. Does reports epigastric discomfort today, esp after trying to eat. Denies back pain. Denies CP/SOB. Also reports pain of R fifth toe; x 4 months. Has been seen for this before and has been referred to a podiatrist. No new injury. Denies foot swelling. No extremity sensation changes or weakness.   Patient's last menstrual period was 09/06/2022 (exact date). Past Surgical History:  Procedure Laterality Date   CARDIAC CATHETERIZATION N/A 07/01/2016   Procedure: LEFT HEART CATH AND CORONARY ANGIOGRAPHY;  Surgeon: Burnell Blanks, MD;  Location: [facility];  Service: Cardiovascular;  Laterality: N/A;   CARDIAC CATHETERIZATION N/A 07/01/2016   Procedure: Coronary Stent Intervention;  Surgeon: Burnell Blanks, MD;  Location: [facility];  Service: Cardiovascular;  Laterality: N/A;   CARPAL TUNNEL RELEASE Bilateral    CERVICAL BIOPSY  W/ LOOP ELECTRODE EXCISION  05/2016   "precancerous cells"   CERVICAL BIOPSY  W/ LOOP ELECTRODE EXCISION     CORONARY ANGIOPLASTY WITH STENT PLACEMENT  07/01/2016    TONSILLECTOMY       OBJECTIVE:  Vitals:   09/24/22 0923 09/24/22 0948 09/24/22 1100  BP: (!) 182/128 (!) 168/90 (!) 161/101  Pulse: 81 84 82  Resp: (!) 21  18  Temp: 98 F (36.7 C)    TempSrc: Oral    SpO2: 97%      General appearance: alert, oriented, no acute distress; appears fatigued HEENT: [place]; AT; oropharynx moist Lungs: unlabored respirations Abdomen: obese; soft; without distention; mild  and poorly localized tenderness to palpation over epigastric abdomen ; normal bowel sounds; without masses or organomegaly; without guarding or rebound tenderness Back: without reported CVA tenderness; FROM at waist Extremities: without LE edema; symmetrical; without gross deformities Skin: warm and dry Neurologic: normal gait Psychological: alert and cooperative; normal mood and affect  Labs: Results for orders placed or performed during the hospital encounter of 09/24/22  Urine Culture   Specimen: Urine, Clean Catch  Result Value Ref Range   Specimen Description URINE, CLEAN CATCH    Special Requests  NONE Performed at [facility], [place] [address], [place], [place] [address]    Culture >=100,000 COLONIES/mL ESCHERICHIA COLI (A)    Report Status 09/26/2022 FINAL    Organism ID, Bacteria ESCHERICHIA COLI (A)       Susceptibility   Escherichia coli - MIC*    AMPICILLIN 8 SENSITIVE Sensitive     CEFAZOLIN <=4 SENSITIVE Sensitive     CEFEPIME <=0.12 SENSITIVE Sensitive     CEFTRIAXONE <=0.25 SENSITIVE Sensitive     CIPROFLOXACIN <=0.25 SENSITIVE Sensitive     GENTAMICIN <=1 SENSITIVE Sensitive     IMIPENEM <=0.25 SENSITIVE Sensitive     NITROFURANTOIN <=16 SENSITIVE Sensitive     TRIMETH/SULFA <=20 SENSITIVE Sensitive     AMPICILLIN/SULBACTAM 4 SENSITIVE Sensitive     PIP/TAZO <=4 SENSITIVE Sensitive     * >=100,000 COLONIES/mL ESCHERICHIA COLI  Comprehensive metabolic panel  Result Value Ref Range   Glucose 206 (H) 70 - 99 mg/dL   BUN 8 6 - 24 mg/dL    Creatinine, Ser 0.74 0.57 - 1.00 mg/dL   eGFR 103 >59 mL/min/1.73   BUN/Creatinine Ratio 11 9 - 23   Sodium 139 134 - 144 mmol/L   Potassium 3.8 3.5 - 5.2 mmol/L   Chloride 97 96 - 106 mmol/L   CO2 18 (L) 20 - 29 mmol/L   Calcium 9.7 8.7 - 10.2 mg/dL   Total Protein 7.8 6.0 - 8.5 g/dL   Albumin 4.5 3.9 - 4.9 g/dL   Globulin, Total 3.3 1.5 - 4.5 g/dL   Albumin/Globulin Ratio 1.4 1.2 - 2.2   Bilirubin Total 0.5 0.0 - 1.2 mg/dL   Alkaline Phosphatase 91 44 - 121 IU/L   AST 15 0 - 40 IU/L   ALT 19 0 - 32 IU/L  CBC with Differential/Platelet  Result Value Ref Range   WBC 16.4 (H) 3.4 - 10.8 x10E3/uL   RBC 5.34 (H) 3.77 - 5.28 x10E6/uL   Hemoglobin 16.2 (H) 11.1 - 15.9 g/dL   Hematocrit 47.3 (H) 34.0 - 46.6 %   MCV 89 79 - 97 fL   MCH 30.3 26.6 - 33.0 pg   MCHC 34.2 31.5 - 35.7 g/dL   RDW 12.8 11.7 - 15.4 %   Platelets 334 150 - 450 x10E3/uL   Neutrophils 74 Not Estab. %   Lymphs 22 Not Estab. %   Monocytes 3 Not Estab. %   Eos 0 Not Estab. %   Basos 0 Not Estab. %   Neutrophils Absolute 12.1 (H) 1.4 - 7.0 x10E3/uL   Lymphocytes Absolute 3.6 (H) 0.7 - 3.1 x10E3/uL   Monocytes Absolute 0.6 0.1 - 0.9 x10E3/uL   EOS (ABSOLUTE) 0.0 0.0 - 0.4 x10E3/uL   Basophils Absolute 0.1 0.0 - 0.2 x10E3/uL   Immature Granulocytes 1 Not Estab. %   Immature Grans (Abs) 0.1 0.0 - 0.1 x10E3/uL  POCT CBG (manual entry)  Result Value Ref Range   POCT Glucose ([facility]) 238 (A) 70 - 99 mg/dL  POCT urinalysis dipstick  Result Value Ref Range   Color, UA yellow yellow   Clarity, UA cloudy (A) clear   Glucose, UA negative negative mg/dL   Bilirubin, UA negative negative   Ketones, POC UA trace (5) (A) negative mg/dL   Spec Grav, UA 1.020 1.010 - 1.025   Blood, UA negative negative   pH, UA 7.5 5.0 - 8.0   Protein Ur, POC =100 (A) negative mg/dL   Urobilinogen, UA 1.0 0.2 or 1.0 E.U./dL  Nitrite, UA Positive (A) Negative   Leukocytes, UA Large (3+) (A) Negative  POCT urine pregnancy  Result Value  Ref Range   Preg Test, Ur Negative Negative   Labs Reviewed  URINE CULTURE - Abnormal; Notable for the following components:      Result Value   Culture >=100,000 COLONIES/mL ESCHERICHIA COLI (*)    Organism ID, Bacteria ESCHERICHIA COLI (*)    All other components within normal limits  COMPREHENSIVE METABOLIC PANEL - Abnormal; Notable for the following components:   Glucose 206 (*)    CO2 18 (*)    All other components within normal limits   Narrative:    Performed at:  [facility] [address], [place], [place]  [id number] Lab Director: Rush Farmer MD, Phone:  [phone]  CBC WITH DIFFERENTIAL/PLATELET - Abnormal; Notable for the following components:   WBC 16.4 (*)    RBC 5.34 (*)    Hemoglobin 16.2 (*)    Hematocrit 47.3 (*)    Neutrophils Absolute 12.1 (*)    Lymphocytes Absolute 3.6 (*)    All other components within normal limits   Narrative:    Performed at:  [address], [place], [place]  [id number] Lab Director: Rush Farmer MD, Phone:  [phone]  POCT FASTING CBG [facility] MANUAL ENTRY - Abnormal; Notable for the following components:   POCT Glucose ([facility]) 238 (*)    All other components within normal limits  POCT URINALYSIS DIP (MANUAL ENTRY) - Abnormal; Notable for the following components:   Clarity, UA cloudy (*)    Ketones, POC UA trace (5) (*)    Protein Ur, POC =100 (*)    Nitrite, UA Positive (*)    Leukocytes, UA Large (3+) (*)    All other components within normal limits  LIPASE, BLOOD  POCT URINE PREGNANCY    Imaging: No results found.   Allergies  Allergen Reactions   Latex Swelling   Levaquin [Levofloxacin] Rash   Metoprolol Nausea And Vomiting and Other (See Comments)    Sweats and dizziness   Tape Rash    Tears skin                                               Past Medical History:  Diagnosis Date   Anxiety    Bipolar 1 disorder, manic, full remission (HCC)    CHF (congestive heart failure)  (HCC)    COPD (chronic obstructive pulmonary disease) (HCC)    Coronary artery disease    Depression    GERD (gastroesophageal reflux disease)    Gout    High cholesterol    Hypertension    Hypothyroidism    Metabolic syndrome    NSTEMI (non-ST elevated myocardial infarction) ([place]) 07/01/2016   On home oxygen therapy    "available; don't use it" (07/01/2016)   PCOS (polycystic ovarian syndrome)    Stomach ulcer    Type 2 diabetes, diet controlled ([place])     Social History   Socioeconomic History   Marital status: Divorced    Spouse name: Not on file   Number of children: Not on file   Years of education: Not on file   Highest education level: Not on file  Occupational History   Not on file  Tobacco Use   Smoking status: Every Day    Packs/day: 0.25  Years: 26.00    Additional pack years: 0.00    Total pack years: 6.50    Types: Cigarettes   Smokeless tobacco: Never  Vaping Use   Vaping Use: Never used  Substance and Sexual Activity   Alcohol use: No   Drug use: Not Currently    Types: Marijuana   Sexual activity: Yes    Birth control/protection: None  Other Topics Concern   Not on file  Social History Narrative   Not on file   Social Determinants of Health   Financial Resource Strain: Not on file  Food Insecurity: Not on file  Transportation Needs: Not on file  Physical Activity: Not on file  Stress: Not on file  Social Connections: Not on file  Intimate Partner Violence: Not on file    Family History  Problem Relation Age of Onset   Diabetes Mother    Heart failure Mother    Heart attack Mother 30   Heart disease Mother    Alcohol abuse Mother    Arthritis Mother    COPD Mother    Depression Mother    Drug abuse Mother    Hyperlipidemia Mother    Hypertension Mother    Intellectual disability Mother    Healthy Son    Alcohol abuse Father    Arthritis Father    Depression Father    Intellectual disability Father    Early death Father     Alcohol abuse Sister    Arthritis Sister    Depression Sister    Intellectual disability Sister    Intellectual disability Maternal Grandmother    Hypertension Maternal Grandmother    Hyperlipidemia Maternal Grandmother    Heart disease Maternal Grandmother    Diabetes Maternal Grandmother    Depression Maternal Grandmother    COPD Maternal Grandmother    Alcohol abuse Maternal Grandfather    Arthritis Maternal Grandfather    COPD Maternal Grandfather    Diabetes Maternal Grandfather    Heart attack Maternal Grandfather    Heart disease Maternal Grandfather    Hyperlipidemia Maternal Grandfather    Hypertension Maternal Grandfather    Intellectual disability Maternal Grandfather    Arthritis Sister      Vanessa Kick, MD 09/26/22 1606    Vanessa Kick, MD 09/26/22 [phone]

## 2022-10-11 DIAGNOSIS — J449 Chronic obstructive pulmonary disease, unspecified: Secondary | ICD-10-CM | POA: Diagnosis not present

## 2022-11-10 DIAGNOSIS — J449 Chronic obstructive pulmonary disease, unspecified: Secondary | ICD-10-CM | POA: Diagnosis not present

## 2022-11-21 ENCOUNTER — Encounter (HOSPITAL_COMMUNITY): Payer: Self-pay | Admitting: Registered Nurse

## 2022-11-21 ENCOUNTER — Ambulatory Visit (HOSPITAL_COMMUNITY)
Admission: EM | Admit: 2022-11-21 | Discharge: 2022-11-21 | Disposition: A | Discharge status: Home / Self Care | Payer: 59 | Attending: Registered Nurse | Admitting: Registered Nurse

## 2022-11-21 DIAGNOSIS — F319 Bipolar disorder, unspecified: Secondary | ICD-10-CM | POA: Diagnosis not present

## 2022-11-21 DIAGNOSIS — F4323 Adjustment disorder with mixed anxiety and depressed mood: Secondary | ICD-10-CM

## 2022-11-21 NOTE — Discharge Instructions (Addendum)
..           Medicare Outpatient Therapy and  Medication Management Outpatient Services   Base on the information you have provided and the presenting issue, outpatient services with therapy and psychiatry have been recommended.  It is imperative that you follow through with treatment recommendations within 5-7 days from the of discharge to mitigate further risk to your safety and mental well-being. A list of referrals has been provided below to get you started.  You are not limited to the list provided.     [facility] [address] [place], [place], [address] [address] phone ([address], [employer] [address] - [place], [address], [place], [place], [employer], [address], BCBS Healthy [place], [place], [address], [place], [place], [place], Optum, Tricare, U[employer], [employer], [place])  [facility] [address] [place], [place], [address] [phone] phone  [facility] [address] [place], [place], [address] [phone] phone  [facility] [address] [place], [place], [address] [phone] phone [phone] fax  [facility], [place] [address], [place], [address] [phone] phone  [facility] [address] [place], [place], [address] [phone] phone [phone] fax  [facility] [address] [place], [place], [address] [phone] phone  [facility]ussma[facility] [address] [place], [place], [address] [phone] phone  [facility] [address] [place], [place], [address] [phone] phone [phone] fax    [facility] [address], [place], [address] [phone] phone  New Patient Assessment/Therapy Walk-ins Monday and Wednesday: 8am until slots are full. Every 1st and 2nd Friday: 1pm - 5pm  NO ASSESSMENT/THERAPY WALK-INS ON TUESDAYS OR  THURSDAYS  New Patient Psychiatry/Medication Management Walk-ins Monday-Friday: 8am-11am  For all walk-ins, we ask that you arrive by 7:30am because patient will be seen in the order of arrival.  Availability is limited; therefore, you may not be seen on the same day that you walk-in.  Our goal is to serve and meet the needs of our community to the best of our ability.   [facility] [address] [place], [place], [address] [phone] phone   [facility] [address] [place] [address] [place], [place], [address] [phone] phone ([address] [employer], [address] Options/[place], BCBS, [address], [place], [place], [place], [place], [employer], [place], [place], [place], [facility])  [facility] [phone] [place], [place], [address] [phone] phone (Medicaid, ask about other insurance)   [facility] [address] [place], [place], [address] [phone] phone ([address], [employer], BCBS, [address], [place], [employer], [place], [place], [place], [employer], [place], [place], [place], [facility])   [facility] [address] [place], [place], [address] [phone] phone  [facility] [address] [place], [place], [address] [phone] phone  [facility] [place] - THERAPY ONLY [address] [place], [address] [phone] phone  [facility], [place] [address], [place], [address] [phone] phone  [facility] [address] [place], [place], [address] [phone] phone [phone] fax  [facility] [address] [place], [place], [address] [phone] phone [phone] fax  [facility] [phone] [address] [place], [place], [address] [phone] phone
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
## 2022-12-11 DIAGNOSIS — J449 Chronic obstructive pulmonary disease, unspecified: Secondary | ICD-10-CM | POA: Diagnosis not present

## 2023-01-10 DIAGNOSIS — J449 Chronic obstructive pulmonary disease, unspecified: Secondary | ICD-10-CM | POA: Diagnosis not present

## 2023-01-23 ENCOUNTER — Other Ambulatory Visit: Payer: Self-pay

## 2023-01-23 ENCOUNTER — Inpatient Hospital Stay (HOSPITAL_COMMUNITY)
Admission: EM | Admit: 2023-01-23 | Discharge: 2023-01-25 | DRG: 918 | Disposition: A | Discharge status: Other Acute Inpatient Hospital | Payer: 59 | Attending: Family Medicine | Admitting: Family Medicine

## 2023-01-23 ENCOUNTER — Encounter (HOSPITAL_COMMUNITY): Payer: Self-pay

## 2023-01-23 DIAGNOSIS — Z833 Family history of diabetes mellitus: Secondary | ICD-10-CM

## 2023-01-23 DIAGNOSIS — E119 Type 2 diabetes mellitus without complications: Secondary | ICD-10-CM

## 2023-01-23 DIAGNOSIS — T50901A Poisoning by unspecified drugs, medicaments and biological substances, accidental (unintentional), initial encounter: Secondary | ICD-10-CM | POA: Diagnosis present

## 2023-01-23 DIAGNOSIS — Z635 Disruption of family by separation and divorce: Secondary | ICD-10-CM

## 2023-01-23 DIAGNOSIS — T391X1A Poisoning by 4-Aminophenol derivatives, accidental (unintentional), initial encounter: Secondary | ICD-10-CM | POA: Diagnosis present

## 2023-01-23 DIAGNOSIS — Z8249 Family history of ischemic heart disease and other diseases of the circulatory system: Secondary | ICD-10-CM | POA: Diagnosis not present

## 2023-01-23 DIAGNOSIS — T887XXA Unspecified adverse effect of drug or medicament, initial encounter: Secondary | ICD-10-CM | POA: Diagnosis not present

## 2023-01-23 DIAGNOSIS — Z955 Presence of coronary angioplasty implant and graft: Secondary | ICD-10-CM

## 2023-01-23 DIAGNOSIS — I10 Essential (primary) hypertension: Secondary | ICD-10-CM | POA: Diagnosis not present

## 2023-01-23 DIAGNOSIS — I110 Hypertensive heart disease with heart failure: Secondary | ICD-10-CM | POA: Diagnosis not present

## 2023-01-23 DIAGNOSIS — I2510 Atherosclerotic heart disease of native coronary artery without angina pectoris: Secondary | ICD-10-CM | POA: Diagnosis present

## 2023-01-23 DIAGNOSIS — Z91048 Other nonmedicinal substance allergy status: Secondary | ICD-10-CM

## 2023-01-23 DIAGNOSIS — F1210 Cannabis abuse, uncomplicated: Secondary | ICD-10-CM | POA: Diagnosis present

## 2023-01-23 DIAGNOSIS — T50904A Poisoning by unspecified drugs, medicaments and biological substances, undetermined, initial encounter: Secondary | ICD-10-CM | POA: Diagnosis not present

## 2023-01-23 DIAGNOSIS — Z825 Family history of asthma and other chronic lower respiratory diseases: Secondary | ICD-10-CM

## 2023-01-23 DIAGNOSIS — R6889 Other general symptoms and signs: Secondary | ICD-10-CM | POA: Diagnosis not present

## 2023-01-23 DIAGNOSIS — Z7982 Long term (current) use of aspirin: Secondary | ICD-10-CM

## 2023-01-23 DIAGNOSIS — F17210 Nicotine dependence, cigarettes, uncomplicated: Secondary | ICD-10-CM | POA: Diagnosis not present

## 2023-01-23 DIAGNOSIS — T391X2A Poisoning by 4-Aminophenol derivatives, intentional self-harm, initial encounter: Principal | ICD-10-CM | POA: Diagnosis present

## 2023-01-23 DIAGNOSIS — R231 Pallor: Secondary | ICD-10-CM | POA: Diagnosis not present

## 2023-01-23 DIAGNOSIS — I252 Old myocardial infarction: Secondary | ICD-10-CM

## 2023-01-23 DIAGNOSIS — F319 Bipolar disorder, unspecified: Secondary | ICD-10-CM | POA: Diagnosis not present

## 2023-01-23 DIAGNOSIS — Z881 Allergy status to other antibiotic agents status: Secondary | ICD-10-CM

## 2023-01-23 DIAGNOSIS — Z79899 Other long term (current) drug therapy: Secondary | ICD-10-CM

## 2023-01-23 DIAGNOSIS — E7800 Pure hypercholesterolemia, unspecified: Secondary | ICD-10-CM | POA: Diagnosis present

## 2023-01-23 DIAGNOSIS — T450X1A Poisoning by antiallergic and antiemetic drugs, accidental (unintentional), initial encounter: Secondary | ICD-10-CM

## 2023-01-23 DIAGNOSIS — Z8261 Family history of arthritis: Secondary | ICD-10-CM

## 2023-01-23 DIAGNOSIS — J449 Chronic obstructive pulmonary disease, unspecified: Secondary | ICD-10-CM | POA: Diagnosis not present

## 2023-01-23 DIAGNOSIS — Z8711 Personal history of peptic ulcer disease: Secondary | ICD-10-CM

## 2023-01-23 DIAGNOSIS — Z7984 Long term (current) use of oral hypoglycemic drugs: Secondary | ICD-10-CM

## 2023-01-23 DIAGNOSIS — K219 Gastro-esophageal reflux disease without esophagitis: Secondary | ICD-10-CM | POA: Diagnosis present

## 2023-01-23 DIAGNOSIS — F419 Anxiety disorder, unspecified: Secondary | ICD-10-CM | POA: Diagnosis present

## 2023-01-23 DIAGNOSIS — F1910 Other psychoactive substance abuse, uncomplicated: Secondary | ICD-10-CM

## 2023-01-23 DIAGNOSIS — E039 Hypothyroidism, unspecified: Secondary | ICD-10-CM | POA: Diagnosis present

## 2023-01-23 DIAGNOSIS — N390 Urinary tract infection, site not specified: Secondary | ICD-10-CM | POA: Diagnosis not present

## 2023-01-23 DIAGNOSIS — E876 Hypokalemia: Secondary | ICD-10-CM | POA: Diagnosis present

## 2023-01-23 DIAGNOSIS — Z810 Family history of intellectual disabilities: Secondary | ICD-10-CM

## 2023-01-23 DIAGNOSIS — Z811 Family history of alcohol abuse and dependence: Secondary | ICD-10-CM

## 2023-01-23 DIAGNOSIS — Z813 Family history of other psychoactive substance abuse and dependence: Secondary | ICD-10-CM

## 2023-01-23 DIAGNOSIS — E785 Hyperlipidemia, unspecified: Secondary | ICD-10-CM | POA: Diagnosis present

## 2023-01-23 DIAGNOSIS — Z818 Family history of other mental and behavioral disorders: Secondary | ICD-10-CM

## 2023-01-23 DIAGNOSIS — Z6836 Body mass index (BMI) 36.0-36.9, adult: Secondary | ICD-10-CM

## 2023-01-23 DIAGNOSIS — B9620 Unspecified Escherichia coli [E. coli] as the cause of diseases classified elsewhere: Secondary | ICD-10-CM | POA: Diagnosis present

## 2023-01-23 DIAGNOSIS — Z91040 Latex allergy status: Secondary | ICD-10-CM

## 2023-01-23 DIAGNOSIS — T1491XA Suicide attempt, initial encounter: Secondary | ICD-10-CM

## 2023-01-23 DIAGNOSIS — E669 Obesity, unspecified: Secondary | ICD-10-CM | POA: Diagnosis present

## 2023-01-23 DIAGNOSIS — Z83438 Family history of other disorder of lipoprotein metabolism and other lipidemia: Secondary | ICD-10-CM

## 2023-01-23 DIAGNOSIS — I5032 Chronic diastolic (congestive) heart failure: Secondary | ICD-10-CM | POA: Diagnosis not present

## 2023-01-23 DIAGNOSIS — M109 Gout, unspecified: Secondary | ICD-10-CM | POA: Diagnosis present

## 2023-01-23 DIAGNOSIS — F1110 Opioid abuse, uncomplicated: Secondary | ICD-10-CM | POA: Diagnosis present

## 2023-01-23 DIAGNOSIS — T450X2A Poisoning by antiallergic and antiemetic drugs, intentional self-harm, initial encounter: Secondary | ICD-10-CM | POA: Diagnosis present

## 2023-01-23 DIAGNOSIS — Z888 Allergy status to other drugs, medicaments and biological substances status: Secondary | ICD-10-CM

## 2023-01-23 LAB — COMPREHENSIVE METABOLIC PANEL
ALT: 17 U/L (ref 0–44)
AST: 16 U/L (ref 15–41)
Albumin: 4.2 g/dL (ref 3.5–5.0)
Alkaline Phosphatase: 64 U/L (ref 38–126)
Anion gap: 10 (ref 5–15)
BUN: 15 mg/dL (ref 6–20)
CO2: 24 mmol/L (ref 22–32)
Calcium: 9.1 mg/dL (ref 8.9–10.3)
Chloride: 103 mmol/L (ref 98–111)
Creatinine, Ser: 0.88 mg/dL (ref 0.44–1.00)
GFR, Estimated: >60 mL/min (ref >60)
Glucose, Bld: 118 mg/dL — ABNORMAL HIGH (ref 70–99)
Potassium: 3.4 mmol/L — ABNORMAL LOW (ref 3.5–5.1)
Sodium: 137 mmol/L (ref 135–145)
Total Bilirubin: 0.8 mg/dL (ref 0.3–1.2)
Total Protein: 8.1 g/dL (ref 6.5–8.1)

## 2023-01-23 LAB — CBC WITH DIFFERENTIAL/PLATELET
Abs Immature Granulocytes: 0.04 10*3/uL (ref 0.00–0.07)
Basophils Absolute: 0.1 10*3/uL (ref 0.0–0.1)
Basophils Relative: 0 %
Eosinophils Absolute: 0.1 10*3/uL (ref 0.0–0.5)
Eosinophils Relative: 1 %
HCT: 46.6 % — ABNORMAL HIGH (ref 36.0–46.0)
Hemoglobin: 16 g/dL — ABNORMAL HIGH (ref 12.0–15.0)
Immature Granulocytes: 0 %
Lymphocytes Relative: 29 %
Lymphs Abs: 4 10*3/uL (ref 0.7–4.0)
MCH: 30.2 pg (ref 26.0–34.0)
MCHC: 34.3 g/dL (ref 30.0–36.0)
MCV: 88.1 fL (ref 80.0–100.0)
Monocytes Absolute: 0.6 10*3/uL (ref 0.1–1.0)
Monocytes Relative: 4 %
Neutro Abs: 8.9 10*3/uL — ABNORMAL HIGH (ref 1.7–7.7)
Neutrophils Relative %: 66 %
Platelets: 251 10*3/uL (ref 150–400)
RBC: 5.29 MIL/uL — ABNORMAL HIGH (ref 3.87–5.11)
RDW: 12.8 % (ref 11.5–15.5)
WBC: 13.7 10*3/uL — ABNORMAL HIGH (ref 4.0–10.5)
nRBC: 0 % (ref 0.0–0.2)

## 2023-01-23 LAB — URINALYSIS, ROUTINE W REFLEX MICROSCOPIC
Bilirubin Urine: NEGATIVE
Glucose, UA: NEGATIVE mg/dL
Hgb urine dipstick: NEGATIVE
Ketones, ur: NEGATIVE mg/dL
Nitrite: POSITIVE — AB
Protein, ur: NEGATIVE mg/dL
Specific Gravity, Urine: 1.024 (ref 1.005–1.030)
pH: 5 (ref 5.0–8.0)

## 2023-01-23 LAB — RAPID URINE DRUG SCREEN, HOSP PERFORMED
Amphetamines: POSITIVE — AB
Barbiturates: NOT DETECTED
Benzodiazepines: NOT DETECTED
Cocaine: NOT DETECTED
Opiates: NOT DETECTED
Tetrahydrocannabinol: POSITIVE — AB

## 2023-01-23 LAB — PROTIME-INR
INR: 1 (ref 0.8–1.2)
Prothrombin Time: 13.5 seconds (ref 11.4–15.2)

## 2023-01-23 LAB — ETHANOL: Alcohol, Ethyl (B): <10 mg/dL (ref <10)

## 2023-01-23 LAB — ACETAMINOPHEN LEVEL: Acetaminophen (Tylenol), Serum: 153 ug/mL (ref 10–30)

## 2023-01-23 LAB — MAGNESIUM: Magnesium: 2.4 mg/dL (ref 1.7–2.4)

## 2023-01-23 LAB — SALICYLATE LEVEL: Salicylate Lvl: <7 mg/dL — ABNORMAL LOW (ref 7.0–30.0)

## 2023-01-23 LAB — CBG MONITORING, ED: Glucose-Capillary: 112 mg/dL — ABNORMAL HIGH (ref 70–99)

## 2023-01-23 LAB — HCG, SERUM, QUALITATIVE: Preg, Serum: NEGATIVE

## 2023-01-23 MED ORDER — POTASSIUM CHLORIDE 10 MEQ/100ML IV SOLN
10.0000 meq | INTRAVENOUS | Status: AC
  Administered 2023-01-23 – 2023-01-24 (×4): 10 meq via INTRAVENOUS
  Filled 2023-01-23 (×4): qty 100

## 2023-01-23 MED ORDER — DEXTROSE 5 % IV SOLN: 1500.0000 mg/h | INTRAVENOUS | Status: DC

## 2023-01-23 MED ORDER — ACETYLCYSTEINE LOAD VIA INFUSION
15000.0000 mg | Freq: Once | INTRAVENOUS | Status: AC
  Administered 2023-01-23: 15000 mg via INTRAVENOUS
  Filled 2023-01-23: qty 492

## 2023-01-23 MED ORDER — DEXTROSE 5 % IV SOLN
15.0000 mg/kg/h | INTRAVENOUS | Status: DC
  Administered 2023-01-23 – 2023-01-24 (×4): 15 mg/kg/h via INTRAVENOUS
  Filled 2023-01-23: qty 90
  Filled 2023-01-23: qty 60
  Filled 2023-01-23 (×3): qty 90
  Filled 2023-01-23: qty 60
  Filled 2023-01-23: qty 30

## 2023-01-23 MED ORDER — ONDANSETRON HCL 4 MG/2ML IJ SOLN
4.0000 mg | Freq: Once | INTRAMUSCULAR | Status: AC
  Administered 2023-01-23: 4 mg via INTRAVENOUS
  Filled 2023-01-23: qty 2

## 2023-01-23 MED ORDER — ACETYLCYSTEINE LOAD VIA INFUSION: 15000.0000 mg | Freq: Once | INTRAVENOUS | Status: DC

## 2023-01-23 NOTE — ED Provider Notes (Signed)
[place] EMERGENCY DEPARTMENT AT [facility] Provider Note   CSN: [id number] Arrival date & time: 01/23/23  1929     History Chief Complaint  Patient presents with   Drug Overdose    Pt bib ems from home for overdose at approx 1800. Pt told EMS that she called family member and states "Im going to die". Per pt she took suboxone, tylenol 500mg  and benadryl 25mg  PM approx 90 pills and gabapentin. GCS 13 with ems. Hx substance abuse. Meth used yesterday and marijuana use   EMS vitals  BP 169/100 HR 80 SPO2 98 RA CBG 145    HPI Kaitlyn Chambers is a 42 y.o. female presenting for polysubstance overdose. She endorsed substantial Tylenol overdose to EMS but will not provide much further history to me. When stimulated, she told me to "fuck off". She stated that she wanted to die. EMS stated that she was lying in the bed covered with pills.  They collected as much as they can and brought her bottles including Suboxone gabapentin and a bottle of Tylenol PM with 80 tablets.  The bottle was filled with the remains of all the medications around the bed and are difficult to differentiate which ones are Tylenol which ones or not.  Per EMS, patient reported a 90 tablet bottle as well. Patient also endorsed multisubstance use to EMS.  Family arrived.  Informed family that I cannot provide HIPAA compliant information related to her presentation today.  She has a living spouse and adopted son to the adopted son give permission for all information to go to the sister who is at bedside. Per family however, DSS was called and took her adopted kids today due to concern for safety situation at home.  She called them somewhere between 5 and 6 PM agitated and crying stating she was going to die.  They cannot provide exact timeline for overdose.  Patient's recorded medical, surgical, social, medication list and allergies were reviewed in the Snapshot window as part of the initial history.   Review of  Systems   Review of Systems  Unable to perform ROS: Psychiatric disorder    Physical Exam Updated Vital Signs BP (!) 164/102   Pulse 68   Temp 97.9 F (36.6 C)   Resp 19   Ht 5\' 7"  (1.702 m)   Wt 125.2 kg   SpO2 97%   BMI 43.23 kg/m  Physical Exam Vitals and nursing note reviewed.  Constitutional:      General: She is not in acute distress.    Appearance: She is well-developed.  HENT:     Head: Normocephalic and atraumatic.  Eyes:     Conjunctiva/sclera: Conjunctivae normal.  Cardiovascular:     Rate and Rhythm: Normal rate and regular rhythm.     Heart sounds: No murmur heard. Pulmonary:     Effort: Pulmonary effort is normal. No respiratory distress.     Breath sounds: Normal breath sounds.  Abdominal:     Palpations: Abdomen is soft.     Tenderness: There is no abdominal tenderness.  Musculoskeletal:        General: No swelling.     Cervical back: Neck supple.  Skin:    General: Skin is warm and dry.     Capillary Refill: Capillary refill takes less than 2 seconds.  Neurological:     Mental Status: She is alert.  Psychiatric:        Mood and Affect: Mood normal.  ED Course/ Medical Decision Making/ A&P     Procedures .Critical Care  Performed by: Glyn Ade, MD Authorized by: Glyn Ade, MD   Critical care provider statement:    Critical care time (minutes):  95   Critical care was necessary to treat or prevent imminent or life-threatening deterioration of the following conditions:  Toxidrome, hepatic failure and metabolic crisis   Critical care was time spent personally by me on the following activities:  Development of treatment plan with patient or surrogate, discussions with consultants, evaluation of patient's response to treatment, examination of patient, ordering and review of laboratory studies, ordering and review of radiographic studies, ordering and performing treatments and interventions, pulse oximetry, re-evaluation of  patient's condition and review of old charts   Care discussed with: admitting provider      Medications Ordered in ED Medications  acetylcysteine (ACETADOTE) 30.5 mg/mL load via infusion 15,000 mg (15,000 mg Intravenous Bolus from Bag 01/23/23 2333)    Followed by  acetylcysteine (ACETADOTE) 18,000 mg in dextrose 5 % 590 mL (30.5085 mg/mL) infusion (0 mg/kg/hr  125.2 kg Intravenous Paused 01/23/23 2250)  potassium chloride 10 mEq in 100 mL IVPB (10 mEq Intravenous New Bag/Given 01/23/23 2253)  ondansetron (ZOFRAN) injection 4 mg (4 mg Intravenous Given 01/23/23 2332)    Medical Decision Making:    Kaitlyn Chambers is a 42 y.o. female who presented to the ED today with chief complaint of overdose detailed above.     Additional history discussed with patient's family/caregivers.  Patient placed on continuous vitals and telemetry monitoring while in ED which was reviewed periodically.   Complete initial physical exam performed, notably the patient  was in no acute distress.  Currently hemodynamically stable.      Reviewed and confirmed nursing documentation for past medical history, family history, social history.    Initial Assessment:   This a critically ill patient presenting with a toxic overdose.  Dors is a massive ingestion of acetaminophen and diphenhydramine together as well as some pills of gabapentin and Suboxone though these are not 100 Paxene seem mostly intact. Based upon presentation seems most consistent with diphenhydramine/Tylenol overdose.  Patient is voluntarily seeking medical treatment at this time but would require IVC if her capacity was changing. Safety's precautions were ordered. Concerning her overdose, her Tylenol bottle was nearly completely full though it is mixed with other pills. Immediate Tylenol level due to uncertain nature of timeline of overdose. Resulted at 153 and discussed with pharmacy and poison control.  They agreed with NAC treatment based on this  conversation, a 4-hour Tylenol level from Time of arrival (11 PM) Concerning her other substance overdoses, they recommended every 8 hour EKGs (diphenhydramine overdose), seizure monitoring and electrolyte optimization with a mag greater than 2 and potassium greater than 4. Reassessment and Plan:   Poison control recommendations were implemented and consulted admitting for observation care and management overnight. Reassessed patient at bedside she is grossly improving.  Now answering questions.  Again did not consent for family update. Disposition:   Based on the above findings, I believe this patient is stable for admission.    Patient/family educated about specific findings on our evaluation and explained exact reasons for admission.  Patient/family educated about clinical situation and time was allowed to answer questions.   Admission team communicated with and agreed with need for admission. Patient admitted. Patient  ready to move at this time.     Emergency Department Medication Summary:   Medications  acetylcysteine (  ACETADOTE) 30.5 mg/mL load via infusion 15,000 mg (15,000 mg Intravenous Bolus from Bag 01/23/23 2333)    Followed by  acetylcysteine (ACETADOTE) 18,000 mg in dextrose 5 % 590 mL (30.5085 mg/mL) infusion (0 mg/kg/hr  125.2 kg Intravenous Paused 01/23/23 2250)  potassium chloride 10 mEq in 100 mL IVPB (10 mEq Intravenous New Bag/Given 01/23/23 2253)  ondansetron (ZOFRAN) injection 4 mg (4 mg Intravenous Given 01/23/23 2332)         Clinical Impression:  1. Polysubstance abuse (HCC)      Data Unavailable   Final Clinical Impression(s) / ED Diagnoses Final diagnoses:  Polysubstance abuse [facility])    Rx / DC Orders ED Discharge Orders     None         Glyn Ade, MD 01/23/23 2342

## 2023-01-23 NOTE — ED Notes (Signed)
Pt is alert and able to follow commands. Slight agitation but redirectable. VSS at this time

## 2023-01-23 NOTE — Progress Notes (Addendum)
MEDICATION RELATED CONSULT NOTE - INITIAL   Pharmacy Consult for Acetylcysteine Indication: acetaminophen overdose  Allergies  Allergen Reactions   Latex Swelling   Levaquin [Levofloxacin] Rash   Metoprolol Nausea And Vomiting and Other (See Comments)    Sweats and dizziness   Tape Rash and Other (See Comments)    Tears the skin, also    Patient Measurements: Height: 5\' 7"  (170.2 cm) Weight: 125.2 kg (276 lb 0.3 oz) IBW/kg (Calculated) : 61.6  Vital Signs: Temp: 97.9 F (36.6 C) (07/22 1943) Temp Source: Oral (07/22 1943) BP: 139/95 (07/22 1944) Pulse Rate: 74 (07/22 1944)   Medical History: Past Medical History:  Diagnosis Date   Anxiety    Bipolar 1 disorder, manic, full remission (HCC)    CHF (congestive heart failure) (HCC)    COPD (chronic obstructive pulmonary disease) (HCC)    Coronary artery disease    Depression    GERD (gastroesophageal reflux disease)    Gout    High cholesterol    Hypertension    Hypothyroidism    Metabolic syndrome    NSTEMI (non-ST elevated myocardial infarction) (HCC) 07/01/2016   On home oxygen therapy    "available; don't use it" (07/01/2016)   PCOS (polycystic ovarian syndrome)    Stomach ulcer    Type 2 diabetes, diet controlled (HCC)     Assessment: 42 y/oF presenting with polysubstance overdose, including tylenol PM, suboxone, and gabapentin. Per Dr. Doran Durand, ingestion may have been around 5pm or earlier per family report.    Acetaminophen level 153  Salicylate level < 7 Ethanol level < 10 AST 16 ALT 17 Tbili 0.8 K 3.4 Magnesium 2.4 PT/INR 13.5/1 UDS + for amphetamines, THC QTc on EKG   Plan (per Poison Control):  Per Jearld Adjutant, Charity fundraiser at [employer], recommend to start Acetylcysteine 150mg /kg loading dose (cap weight at 100kg for load), then 15mg /kg/hr maintenance rate thereafter (no cap on weight, use total weight of 125.2kg for maintenance rate) Per Jearld Adjutant, RN, dose cap for loading dose and no dose  cap for maintenance rate were discussed with her medical director, Joycelyn Rua. Replete K now, goal of at least 4 (recommendation given to ED provider). Repeat EKG at midnight. If QTc > , recommending rechecking BMET and Magnesium. Supplement to K at least 4, Mag at least 2.  Repeat acetaminophen level, LFTs, and PT/INR 22 hours after loading dose starts per Duwayne Heck, RN at [facility]    Greer Pickerel, PharmD, BCPS Clinical Pharmacist 01/23/2023 10:52 PM

## 2023-01-23 NOTE — ED Notes (Signed)
Pt requested restroom assistance but was not responsive when asked to adjust for bedpan placement.

## 2023-01-23 NOTE — ED Notes (Signed)
Poison control: tylenol level at 1015 pm, salicylate, cmp, mag. QRS and qt prolongation may occur along with retention of urine, tachycardia. Repeat EKG before clearing. If 4 hr tylenol level greater than 150 treat with acetylcysteine.  Iv fluids for tachycardia. Benzos for seizures or agitation. Obs for a minimum of 6-8 hrs.

## 2023-01-24 DIAGNOSIS — Z8249 Family history of ischemic heart disease and other diseases of the circulatory system: Secondary | ICD-10-CM | POA: Diagnosis not present

## 2023-01-24 DIAGNOSIS — Z635 Disruption of family by separation and divorce: Secondary | ICD-10-CM | POA: Diagnosis not present

## 2023-01-24 DIAGNOSIS — F1110 Opioid abuse, uncomplicated: Secondary | ICD-10-CM | POA: Diagnosis present

## 2023-01-24 DIAGNOSIS — T391X2A Poisoning by 4-Aminophenol derivatives, intentional self-harm, initial encounter: Secondary | ICD-10-CM | POA: Diagnosis not present

## 2023-01-24 DIAGNOSIS — F319 Bipolar disorder, unspecified: Secondary | ICD-10-CM | POA: Diagnosis not present

## 2023-01-24 DIAGNOSIS — E039 Hypothyroidism, unspecified: Secondary | ICD-10-CM | POA: Diagnosis not present

## 2023-01-24 DIAGNOSIS — N390 Urinary tract infection, site not specified: Secondary | ICD-10-CM | POA: Diagnosis not present

## 2023-01-24 DIAGNOSIS — T391X1A Poisoning by 4-Aminophenol derivatives, accidental (unintentional), initial encounter: Secondary | ICD-10-CM | POA: Diagnosis present

## 2023-01-24 DIAGNOSIS — T50901A Poisoning by unspecified drugs, medicaments and biological substances, accidental (unintentional), initial encounter: Secondary | ICD-10-CM | POA: Diagnosis present

## 2023-01-24 DIAGNOSIS — E669 Obesity, unspecified: Secondary | ICD-10-CM | POA: Diagnosis not present

## 2023-01-24 DIAGNOSIS — T450X1A Poisoning by antiallergic and antiemetic drugs, accidental (unintentional), initial encounter: Secondary | ICD-10-CM

## 2023-01-24 DIAGNOSIS — E7800 Pure hypercholesterolemia, unspecified: Secondary | ICD-10-CM | POA: Diagnosis present

## 2023-01-24 DIAGNOSIS — F1210 Cannabis abuse, uncomplicated: Secondary | ICD-10-CM | POA: Diagnosis not present

## 2023-01-24 DIAGNOSIS — Z955 Presence of coronary angioplasty implant and graft: Secondary | ICD-10-CM | POA: Diagnosis not present

## 2023-01-24 DIAGNOSIS — E876 Hypokalemia: Secondary | ICD-10-CM | POA: Diagnosis present

## 2023-01-24 DIAGNOSIS — T450X2A Poisoning by antiallergic and antiemetic drugs, intentional self-harm, initial encounter: Secondary | ICD-10-CM | POA: Diagnosis not present

## 2023-01-24 DIAGNOSIS — I110 Hypertensive heart disease with heart failure: Secondary | ICD-10-CM | POA: Diagnosis not present

## 2023-01-24 DIAGNOSIS — E119 Type 2 diabetes mellitus without complications: Secondary | ICD-10-CM | POA: Diagnosis not present

## 2023-01-24 DIAGNOSIS — I2510 Atherosclerotic heart disease of native coronary artery without angina pectoris: Secondary | ICD-10-CM | POA: Diagnosis present

## 2023-01-24 DIAGNOSIS — F17210 Nicotine dependence, cigarettes, uncomplicated: Secondary | ICD-10-CM | POA: Diagnosis present

## 2023-01-24 DIAGNOSIS — B9620 Unspecified Escherichia coli [E. coli] as the cause of diseases classified elsewhere: Secondary | ICD-10-CM | POA: Diagnosis not present

## 2023-01-24 DIAGNOSIS — T1491XA Suicide attempt, initial encounter: Secondary | ICD-10-CM

## 2023-01-24 DIAGNOSIS — Z7984 Long term (current) use of oral hypoglycemic drugs: Secondary | ICD-10-CM | POA: Diagnosis not present

## 2023-01-24 DIAGNOSIS — Z881 Allergy status to other antibiotic agents status: Secondary | ICD-10-CM | POA: Diagnosis not present

## 2023-01-24 DIAGNOSIS — I5032 Chronic diastolic (congestive) heart failure: Secondary | ICD-10-CM | POA: Diagnosis not present

## 2023-01-24 DIAGNOSIS — I252 Old myocardial infarction: Secondary | ICD-10-CM | POA: Diagnosis not present

## 2023-01-24 DIAGNOSIS — J449 Chronic obstructive pulmonary disease, unspecified: Secondary | ICD-10-CM | POA: Diagnosis not present

## 2023-01-24 DIAGNOSIS — Z7982 Long term (current) use of aspirin: Secondary | ICD-10-CM | POA: Diagnosis not present

## 2023-01-24 LAB — TSH: TSH: 3.461 u[IU]/mL (ref 0.350–4.500)

## 2023-01-24 LAB — CBC
HCT: 45.3 % (ref 36.0–46.0)
Hemoglobin: 15.1 g/dL — ABNORMAL HIGH (ref 12.0–15.0)
MCH: 29.7 pg (ref 26.0–34.0)
MCHC: 33.3 g/dL (ref 30.0–36.0)
MCV: 89 fL (ref 80.0–100.0)
Platelets: 228 10*3/uL (ref 150–400)
RBC: 5.09 MIL/uL (ref 3.87–5.11)
RDW: 12.6 % (ref 11.5–15.5)
WBC: 9.8 10*3/uL (ref 4.0–10.5)
nRBC: 0 % (ref 0.0–0.2)

## 2023-01-24 LAB — GLUCOSE, CAPILLARY
Glucose-Capillary: 130 mg/dL — ABNORMAL HIGH (ref 70–99)
Glucose-Capillary: 113 mg/dL — ABNORMAL HIGH (ref 70–99)
Glucose-Capillary: 162 mg/dL — ABNORMAL HIGH (ref 70–99)
Glucose-Capillary: 167 mg/dL — ABNORMAL HIGH (ref 70–99)
Glucose-Capillary: 120 mg/dL — ABNORMAL HIGH (ref 70–99)

## 2023-01-24 LAB — HEMOGLOBIN A1C
Hgb A1c MFr Bld: 6 % — ABNORMAL HIGH (ref 4.8–5.6)
Mean Plasma Glucose: 125.5 mg/dL

## 2023-01-24 LAB — BASIC METABOLIC PANEL
Anion gap: 10 (ref 5–15)
BUN: 11 mg/dL (ref 6–20)
CO2: 20 mmol/L — ABNORMAL LOW (ref 22–32)
Calcium: 8.2 mg/dL — ABNORMAL LOW (ref 8.9–10.3)
Chloride: 106 mmol/L (ref 98–111)
Creatinine, Ser: 0.62 mg/dL (ref 0.44–1.00)
GFR, Estimated: >60 mL/min (ref >60)
Glucose, Bld: 119 mg/dL — ABNORMAL HIGH (ref 70–99)
Potassium: 3.2 mmol/L — ABNORMAL LOW (ref 3.5–5.1)
Sodium: 136 mmol/L (ref 135–145)

## 2023-01-24 LAB — HIV ANTIBODY (ROUTINE TESTING W REFLEX): HIV Screen 4th Generation wRfx: NONREACTIVE

## 2023-01-24 LAB — ACETAMINOPHEN LEVEL: Acetaminophen (Tylenol), Serum: 172 ug/mL (ref 10–30)

## 2023-01-24 LAB — MRSA NEXT GEN BY PCR, NASAL: MRSA by PCR Next Gen: NOT DETECTED

## 2023-01-24 MED ORDER — IRBESARTAN 150 MG PO TABS
150.0000 mg | ORAL_TABLET | Freq: Every day | ORAL | Status: DC
  Administered 2023-01-24 – 2023-01-25 (×2): 150 mg via ORAL
  Filled 2023-01-24 (×2): qty 1

## 2023-01-24 MED ORDER — ENOXAPARIN SODIUM 40 MG/0.4ML IJ SOSY: 40.0000 mg | PREFILLED_SYRINGE | INTRAMUSCULAR | Status: DC

## 2023-01-24 MED ORDER — PROCHLORPERAZINE EDISYLATE 10 MG/2ML IJ SOLN
10.0000 mg | Freq: Once | INTRAMUSCULAR | Status: AC
  Administered 2023-01-24: 10 mg via INTRAVENOUS
  Filled 2023-01-24: qty 2

## 2023-01-24 MED ORDER — ENOXAPARIN SODIUM 40 MG/0.4ML IJ SOSY
40.0000 mg | PREFILLED_SYRINGE | Freq: Every day | INTRAMUSCULAR | Status: DC
  Administered 2023-01-24 – 2023-01-25 (×2): 40 mg via SUBCUTANEOUS
  Filled 2023-01-24 (×2): qty 0.4

## 2023-01-24 MED ORDER — PANTOPRAZOLE SODIUM 20 MG PO TBEC
20.0000 mg | DELAYED_RELEASE_TABLET | Freq: Every day | ORAL | Status: DC
  Administered 2023-01-25: 20 mg via ORAL
  Filled 2023-01-24: qty 1

## 2023-01-24 MED ORDER — ASPIRIN 81 MG PO CHEW
CHEWABLE_TABLET | ORAL | Status: AC
  Filled 2023-01-24: qty 1

## 2023-01-24 MED ORDER — FUROSEMIDE 40 MG PO TABS
40.0000 mg | ORAL_TABLET | Freq: Every day | ORAL | Status: DC
  Administered 2023-01-24 – 2023-01-25 (×2): 40 mg via ORAL
  Filled 2023-01-24 (×2): qty 1

## 2023-01-24 MED ORDER — CHLORHEXIDINE GLUCONATE CLOTH 2 % EX PADS
6.0000 | MEDICATED_PAD | Freq: Every evening | CUTANEOUS | Status: DC
  Administered 2023-01-24 – 2023-01-25 (×2): 6 via TOPICAL

## 2023-01-24 MED ORDER — ASPIRIN 81 MG PO TBEC
DELAYED_RELEASE_TABLET | ORAL | Status: AC
  Filled 2023-01-24: qty 1

## 2023-01-24 MED ORDER — NICOTINE 21 MG/24HR TD PT24
21.0000 mg | MEDICATED_PATCH | Freq: Every day | TRANSDERMAL | Status: DC
  Administered 2023-01-24 – 2023-01-25 (×2): 21 mg via TRANSDERMAL
  Filled 2023-01-24 (×2): qty 1

## 2023-01-24 MED ORDER — PRAZOSIN HCL 1 MG PO CAPS
2.0000 mg | ORAL_CAPSULE | Freq: Every day | ORAL | Status: DC
  Administered 2023-01-24: 2 mg via ORAL
  Filled 2023-01-24: qty 2

## 2023-01-24 MED ORDER — INSULIN ASPART 100 UNIT/ML IJ SOLN
0.0000 [IU] | INTRAMUSCULAR | Status: DC
  Administered 2023-01-24 – 2023-01-25 (×4): 2 [IU] via SUBCUTANEOUS
  Filled 2023-01-24: qty 0.09

## 2023-01-24 MED ORDER — ASPIRIN 81 MG PO TBEC
81.0000 mg | DELAYED_RELEASE_TABLET | Freq: Every day | ORAL | Status: DC
  Administered 2023-01-24 – 2023-01-25 (×2): 81 mg via ORAL
  Filled 2023-01-24: qty 1

## 2023-01-24 MED ORDER — AMLODIPINE BESYLATE 5 MG PO TABS
5.0000 mg | ORAL_TABLET | Freq: Every day | ORAL | Status: DC
  Administered 2023-01-24 – 2023-01-25 (×2): 5 mg via ORAL
  Filled 2023-01-24 (×2): qty 1

## 2023-01-24 MED ORDER — POTASSIUM CHLORIDE CRYS ER 20 MEQ PO TBCR
40.0000 meq | EXTENDED_RELEASE_TABLET | ORAL | Status: AC
  Administered 2023-01-24 (×2): 40 meq via ORAL
  Filled 2023-01-24 (×2): qty 2

## 2023-01-24 MED ORDER — HYDRALAZINE HCL 20 MG/ML IJ SOLN
10.0000 mg | Freq: Four times a day (QID) | INTRAMUSCULAR | Status: DC | PRN
  Administered 2023-01-24 (×2): 10 mg via INTRAVENOUS
  Filled 2023-01-24 (×2): qty 1

## 2023-01-24 MED ORDER — SODIUM CHLORIDE 0.9 % IV SOLN
2.0000 g | INTRAVENOUS | Status: DC
  Administered 2023-01-24 – 2023-01-25 (×2): 2 g via INTRAVENOUS
  Filled 2023-01-24 (×2): qty 20

## 2023-01-24 MED ORDER — SODIUM CHLORIDE 0.9 % IV SOLN: 1.0000 g | INTRAVENOUS | Status: DC

## 2023-01-24 MED ORDER — FUROSEMIDE 40 MG PO TABS: 40.0000 mg | ORAL_TABLET | ORAL | Status: DC | PRN

## 2023-01-24 NOTE — Plan of Care (Signed)
Discussed with patient plan of care, pain management and admission questions with some teach back displayed at this time.    Problem: Education: Goal: Ability to describe self-care measures that may prevent or decrease complications (Diabetes Survival Skills Education) will improve Outcome: Not Progressing   Problem: Coping: Goal: Ability to adjust to condition or change in health will improve Outcome: Not Progressing   Problem: Health Behavior/Discharge Planning: Goal: Ability to identify and utilize available resources and services will improve Outcome: Not Progressing

## 2023-01-24 NOTE — Plan of Care (Signed)
  Problem: Education: Goal: Ability to make informed decisions regarding treatment will improve Outcome: Not Progressing   Problem: Coping: Goal: Coping ability will improve Outcome: Not Progressing   Problem: Health Behavior/Discharge Planning: Goal: Identification of resources available to assist in meeting health care needs will improve Outcome: Not Progressing   Problem: Medication: Goal: Compliance with prescribed medication regimen will improve Outcome: Not Progressing   Problem: Self-Concept: Goal: Ability to disclose and discuss suicidal ideas will improve Outcome: Not Progressing Goal: Will verbalize positive feelings about self Outcome: Not Progressing

## 2023-01-24 NOTE — Consult Note (Signed)
[facility] Face-to-Face Psychiatry Consult   Reason for Consult:  Suicide attempt Referring Physician:  Dr. Jacqulyn Bath Patient Identification: Kaitlyn Chambers MRN:  [id number] Principal Diagnosis: Acetaminophen overdose Diagnosis:  Principal Problem:   Acetaminophen overdose Active Problems:   Hypertension   Bipolar 1 disorder (HCC)   Diabetes mellitus (HCC)   Dyslipidemia   CAD (coronary artery disease)   Diphenhydramine overdose   Suicide attempt (HCC)   UTI (urinary tract infection)   Drug overdose   Total Time spent with patient: 1 hour  Subjective:   Kaitlyn Chambers is a 42 y.o. female patient admitted with suicide attempt after intentional ingestion of Tylenol PM.  Patient reports intentional ingestion of 1.5 bottle of Tylenol PM.  Soon after her suicide attempt, she states she attempted to and self induced vomiting however unsuccessful.  At which point she notified her sister for help.  She states she felt very hopeless, helpless, worthless and as if things would not get better.  She is very remorseful for her actions and disappointed " I cannot believe I did that stupid shit."  Patient identifies recent triggers as multiple psychosocial stressors family dysfunction, separation from husband of 17 years, seeing husband's new girlfriend, car trouble.  She states she has 3 children with her ex-husband ages 30, 68, 41.  She currently works as a Engineer, manufacturing systems.  She reports loss of her brother who was killed in [place], and her mother is now deceased.   She endorses acute symptoms of mania at this time to include impulsivity, mood lability, and decreased need for sleep.  She does not present with any of the above symptoms on this evaluation.  She further endorses any depressive symptoms to include anhedonia, hopelessness, worthlessness, guilty, suicidal.  She denies any acute psychosis, paranoia.  She endorses symptoms of trauma related stressors to include flashbacks and  nightmares, from her from her husband.  She does not appear to be displaying any or responding to internal stimuli, external stimuli, or exhibiting delusional thought disorder.  Patient denies any access to weapons, denies any alcohol and or substance abuse.  She reports poor sleep and poor appetite.  She is currently not receiving any services at this time, last seen at [facility] after her sister contacted mobile crisis for suspected suicidal ideation.  She is unable to recall her previous medication however suspects she was taking Prozac and olanzapine " some antipsychotic/mood stabilizer combo that they deemed a cocktail.  I was also taking Klonopin."  Patient denies any auditory and/or visual hallucinations, does not appear to be responding to internal or external stimuli.  There is no evidence of delusional thought content and patient appears to answer all questions appropriately.  At this time patient will benefit from inpatient psychiatric hospitalization for medication management, crisis stabilization, recent suicide attempt of high lethality, worsening depressive symptoms, and endorsing mania symptoms.   Patient did not provide consent to speak with sister "I do not want to bother her.  She was so worried about me yesterday."   HPI:  Kaitlyn Chambers is a 42 y.o. female with medical history significant of chronic HFpEF, COPD, CAD status post PCI in 2017, hypertension, hyperlipidemia, hypothyroidism, type 2 diabetes, GERD, anxiety, bipolar disorder, depression, gout presented to the ED after intentional drug overdose/suicide attempt.  Patient took a large amount of Tylenol PM as well as some pills of gabapentin and Suboxone.  She was awake and alert on arrival to the ED, vital  signs stable.  Labs showing WBC 13.7, hemoglobin 16.0, potassium 3.4, magnesium 2.4, bicarb 24, creatinine 0.8, glucose 118, normal LFTs, blood ethanol level <10, salicylate level <7.0, acetaminophen  level 153> 172, INR 1.0, UDS positive for amphetamines and THC.  UA showing positive nitrite, small amount of leukocytes, and microscopy showing 21-50 WBCs and many bacteria.  EKG showing sinus rhythm and slight QT prolongation (QTc 494).  Poison control recommended starting NAC treatment.  Regarding her other substance overdoses, they recommended checking EKGs every 8 hours, seizure monitoring, and electrolyte optimization with Mag >2 and potassium >4.  Pharmacy consulted and patient was started on NAC treatment.  She also received Zofran, Compazine, and IV potassium 10 mEq x 4 in the ED.   Past Psychiatric History: Patient reports suggested diagnoses of borderline personality disorder versus bipolar disorder, adjustment disorder, severe generalized anxiety disorder, and PTSD.  She denies any history of suicide attempts.  She endorses current probation for obtaining property by false pretense, an upcoming court date for violation of probation.  She denies daily substance use however reports intermittent use of alcohol (last drink June 27), and marijuana.  She denies any history of aggression, violence, and or homicidal intent.  Risk to Self:  Yes Risk to Others:  Denies Prior Inpatient Therapy:  Denies Prior Outpatient Therapy:  None currently  Past Medical History:  Past Medical History:  Diagnosis Date   Anxiety    Bipolar 1 disorder, manic, full remission (HCC)    CHF (congestive heart failure) (HCC)    COPD (chronic obstructive pulmonary disease) (HCC)    Coronary artery disease    Depression    GERD (gastroesophageal reflux disease)    Gout    High cholesterol    Hypertension    Hypothyroidism    Metabolic syndrome    NSTEMI (non-ST elevated myocardial infarction) (HCC) 07/01/2016   On home oxygen therapy    "available; don't use it" (07/01/2016)   PCOS (polycystic ovarian syndrome)    Stomach ulcer    Type 2 diabetes, diet controlled (HCC)     Past Surgical History:  Procedure  Laterality Date   CARDIAC CATHETERIZATION N/A 07/01/2016   Procedure: LEFT HEART CATH AND CORONARY ANGIOGRAPHY;  Surgeon: Kathleene Hazel, MD;  Location: [facility] INVASIVE CV LAB;  Service: Cardiovascular;  Laterality: N/A;   CARDIAC CATHETERIZATION N/A 07/01/2016   Procedure: Coronary Stent Intervention;  Surgeon: Kathleene Hazel, MD;  Location: [facility] INVASIVE CV LAB;  Service: Cardiovascular;  Laterality: N/A;   CARPAL TUNNEL RELEASE Bilateral    CERVICAL BIOPSY  W/ LOOP ELECTRODE EXCISION  05/2016   "precancerous cells"   CERVICAL BIOPSY  W/ LOOP ELECTRODE EXCISION     CORONARY ANGIOPLASTY WITH STENT PLACEMENT  07/01/2016   TONSILLECTOMY     Family History:  Family History  Problem Relation Age of Onset   Diabetes Mother    Heart failure Mother    Heart attack Mother 35   Heart disease Mother    Alcohol abuse Mother    Arthritis Mother    COPD Mother    Depression Mother    Drug abuse Mother    Hyperlipidemia Mother    Hypertension Mother    Intellectual disability Mother    Healthy Son    Alcohol abuse Father    Arthritis Father    Depression Father    Intellectual disability Father    Early death Father    Alcohol abuse Sister    Arthritis Sister  Depression Sister    Intellectual disability Sister    Intellectual disability Maternal Grandmother    Hypertension Maternal Grandmother    Hyperlipidemia Maternal Grandmother    Heart disease Maternal Grandmother    Diabetes Maternal Grandmother    Depression Maternal Grandmother    COPD Maternal Grandmother    Alcohol abuse Maternal Grandfather    Arthritis Maternal Grandfather    COPD Maternal Grandfather    Diabetes Maternal Grandfather    Heart attack Maternal Grandfather    Heart disease Maternal Grandfather    Hyperlipidemia Maternal Grandfather    Hypertension Maternal Grandfather    Intellectual disability Maternal Grandfather    Arthritis Sister    Family Psychiatric  History: Mother diagnosed  with bipolar depression. Social History:  Social History   Substance and Sexual Activity  Alcohol Use No     Social History   Substance and Sexual Activity  Drug Use Not Currently   Types: Marijuana    Social History   Socioeconomic History   Marital status: Legally Separated    Spouse name: Not on file   Number of children: Not on file   Years of education: Not on file   Highest education level: Not on file  Occupational History   Not on file  Tobacco Use   Smoking status: Every Day    Current packs/day: 0.25    Average packs/day: 0.3 packs/day for 26.0 years (6.5 ttl pk-yrs)    Types: Cigarettes   Smokeless tobacco: Never  Vaping Use   Vaping status: Never Used  Substance and Sexual Activity   Alcohol use: No   Drug use: Not Currently    Types: Marijuana   Sexual activity: Yes    Birth control/protection: None  Other Topics Concern   Not on file  Social History Narrative   Not on file   Social Determinants of Health   Financial Resource Strain: Not on file  Food Insecurity: Not on file  Transportation Needs: Not on file  Physical Activity: Not on file  Stress: Not on file  Social Connections: Not on file   Additional Social History:    Allergies:   Allergies  Allergen Reactions   Latex Swelling   Levaquin [Levofloxacin] Rash   Metoprolol Nausea And Vomiting and Other (See Comments)    Sweats and dizziness   Tape Rash and Other (See Comments)    Tears the skin, also    Labs:  Results for orders placed or performed during the hospital encounter of 01/23/23 (from the past 48 hour(s))  Ethanol     Status: None   Collection Time: 01/23/23  7:52 PM  Result Value Ref Range   Alcohol, Ethyl (B) <10 <10 mg/dL    Comment: (NOTE) Lowest detectable limit for serum alcohol is 10 mg/dL.  For medical purposes only. Performed at [facility], [address] [place], [place] [address]   Salicylate level     Status: Abnormal    Collection Time: 01/23/23  7:52 PM  Result Value Ref Range   Salicylate Lvl <7.0 (L) 7.0 - 30.0 mg/dL    Comment: Performed at [facility], [address], [place], [place] [address]  Acetaminophen level     Status: Abnormal   Collection Time: 01/23/23  7:52 PM  Result Value Ref Range   Acetaminophen (Tylenol), Serum 153 (HH) 10 - 30 ug/mL    Comment: CRITICAL RESULT CALLED TO, READ BACK BY AND VERIFIED WITH RIVERS, TJ RN @ [phone]  01/23/23 BY CHILDRESS,E (NOTE) Therapeutic concentrations vary significantly. A range of 10-30 ug/mL  may be an effective concentration for many patients. However, some  are best treated at concentrations outside of this range. Acetaminophen concentrations >150 ug/mL at 4 hours after ingestion  and >50 ug/mL at 12 hours after ingestion are often associated with  toxic reactions.  Performed at [facility], [address], [place], [place] [address]   Comprehensive metabolic panel     Status: Abnormal   Collection Time: 01/23/23  7:54 PM  Result Value Ref Range   Sodium 137 135 - 145 mmol/L   Potassium 3.4 (L) 3.5 - 5.1 mmol/L   Chloride 103 98 - 111 mmol/L   CO2 24 22 - 32 mmol/L   Glucose, Bld 118 (H) 70 - 99 mg/dL    Comment: Glucose reference range applies only to samples taken after fasting for at least 8 hours.   BUN 15 6 - 20 mg/dL   Creatinine, Ser 3.24 0.44 - 1.00 mg/dL   Calcium 9.1 8.9 - 40.1 mg/dL   Total Protein 8.1 6.5 - 8.1 g/dL   Albumin 4.2 3.5 - 5.0 g/dL   AST 16 15 - 41 U/L   ALT 17 0 - 44 U/L   Alkaline Phosphatase 64 38 - 126 U/L   Total Bilirubin 0.8 0.3 - 1.2 mg/dL   GFR, Estimated >02 >72 mL/min    Comment: (NOTE) Calculated using the CKD-EPI Creatinine Equation (2021)    Anion gap 10 5 - 15    Comment: Performed at [facility], [address] [place], [place] [address]  hCG, serum, qualitative     Status: None   Collection Time: 01/23/23  7:54 PM  Result Value Ref  Range   Preg, Serum NEGATIVE NEGATIVE    Comment:        THE SENSITIVITY OF THIS METHODOLOGY IS >10 mIU/mL. Performed at [facility], [address] [place], [place] [address]   CBC with Differential     Status: Abnormal   Collection Time: 01/23/23  7:54 PM  Result Value Ref Range   WBC 13.7 (H) 4.0 - 10.5 K/uL   RBC 5.29 (H) 3.87 - 5.11 MIL/uL   Hemoglobin 16.0 (H) 12.0 - 15.0 g/dL   HCT 42.5 (H) 95.6 - 38.7 %   MCV 88.1 80.0 - 100.0 fL   MCH 30.2 26.0 - 34.0 pg   MCHC 34.3 30.0 - 36.0 g/dL   RDW 56.4 33.2 - 95.1 %   Platelets 251 150 - 400 K/uL   nRBC 0.0 0.0 - 0.2 %   Neutrophils Relative % 66 %   Neutro Abs 8.9 (H) 1.7 - 7.7 K/uL   Lymphocytes Relative 29 %   Lymphs Abs 4.0 0.7 - 4.0 K/uL   Monocytes Relative 4 %   Monocytes Absolute 0.6 0.1 - 1.0 K/uL   Eosinophils Relative 1 %   Eosinophils Absolute 0.1 0.0 - 0.5 K/uL   Basophils Relative 0 %   Basophils Absolute 0.1 0.0 - 0.1 K/uL   Immature Granulocytes 0 %   Abs Immature Granulocytes 0.04 0.00 - 0.07 K/uL    Comment: Performed at [facility] [place] [facility], [address] [place], [place] [address]  Magnesium     Status: None   Collection Time: 01/23/23  7:54 PM  Result Value Ref Range   Magnesium 2.4 1.7 - 2.4 mg/dL    Comment: Performed at [facility], [address] [place], [place] [address]  CBG monitoring, ED     Status: Abnormal   Collection Time: 01/23/23  7:56 PM  Result Value Ref Range   Glucose-Capillary 112 (H) 70 - 99 mg/dL    Comment: Glucose reference range applies only to samples taken after fasting for at least 8 hours.  Rapid urine drug screen (hospital performed)     Status: Abnormal   Collection Time: 01/23/23  8:19 PM  Result Value Ref Range   Opiates NONE DETECTED NONE DETECTED   Cocaine NONE DETECTED NONE DETECTED   Benzodiazepines NONE DETECTED NONE DETECTED   Amphetamines POSITIVE (A) NONE DETECTED   Tetrahydrocannabinol POSITIVE (A)  NONE DETECTED   Barbiturates NONE DETECTED NONE DETECTED    Comment: (NOTE) DRUG SCREEN FOR MEDICAL PURPOSES ONLY.  IF CONFIRMATION IS NEEDED FOR ANY PURPOSE, NOTIFY LAB WITHIN 5 DAYS.  LOWEST DETECTABLE LIMITS FOR URINE DRUG SCREEN Drug Class                     Cutoff (ng/mL) Amphetamine and metabolites    1000 Barbiturate and metabolites    200 Benzodiazepine                 200 Opiates and metabolites        300 Cocaine and metabolites        300 THC                            50 Performed at [facility], [address], [place], [place] [address]   Urinalysis, Routine w reflex microscopic -Urine, Catheterized     Status: Abnormal   Collection Time: 01/23/23  8:19 PM  Result Value Ref Range   Color, Urine YELLOW YELLOW   APPearance HAZY (A) CLEAR   Specific Gravity, Urine 1.024 1.005 - 1.030   pH 5.0 5.0 - 8.0   Glucose, UA NEGATIVE NEGATIVE mg/dL   Hgb urine dipstick NEGATIVE NEGATIVE   Bilirubin Urine NEGATIVE NEGATIVE   Ketones, ur NEGATIVE NEGATIVE mg/dL   Protein, ur NEGATIVE NEGATIVE mg/dL   Nitrite POSITIVE (A) NEGATIVE   Leukocytes,Ua SMALL (A) NEGATIVE   RBC / HPF 0-5 0 - 5 RBC/hpf   WBC, UA 21-50 0 - 5 WBC/hpf   Bacteria, UA MANY (A) NONE SEEN   Squamous Epithelial / HPF 0-5 0 - 5 /HPF   Mucus PRESENT    Hyaline Casts, UA PRESENT     Comment: Performed at [facility], [address] [place], [place] [address]  Protime-INR     Status: None   Collection Time: 01/23/23 10:00 PM  Result Value Ref Range   Prothrombin Time 13.5 11.4 - 15.2 seconds   INR 1.0 0.8 - 1.2    Comment: (NOTE) INR goal varies based on device and disease states. Performed at [facility], [address] [place], [place] [address]   Acetaminophen level     Status: Abnormal   Collection Time: 01/23/23 11:47 PM  Result Value Ref Range   Acetaminophen (Tylenol), Serum 172 (HH) 10 - 30 ug/mL    Comment: CRITICAL RESULT  CALLED TO, READ BACK BY AND VERIFIED WITH RIVERS, TJ, RN 01/24/23 0056 BY K. DAVIS (NOTE) Therapeutic concentrations vary significantly. A range of 10-30 ug/mL  may be an effective concentration for many patients. However, some  are best treated at concentrations outside of this range. Acetaminophen concentrations >150  ug/mL at 4 hours after ingestion  and >50 ug/mL at 12 hours after ingestion are often associated with  toxic reactions.  Performed at [facility], [address] [place], [place] [address]   MRSA Next Gen by PCR, Nasal     Status: None   Collection Time: 01/24/23  5:55 AM   Specimen: Nasal Mucosa; Nasal Swab  Result Value Ref Range   MRSA by PCR Next Gen NOT DETECTED NOT DETECTED    Comment: (NOTE) The GeneXpert MRSA Assay (FDA approved for NASAL specimens only), is one component of a comprehensive MRSA colonization surveillance program. It is not intended to diagnose MRSA infection nor to guide or monitor treatment for MRSA infections. Test performance is not FDA approved in patients less than 46 years old. Performed at [facility], [address] [place], [place] [address]   Glucose, capillary     Status: Abnormal   Collection Time: 01/24/23  5:56 AM  Result Value Ref Range   Glucose-Capillary 130 (H) 70 - 99 mg/dL    Comment: Glucose reference range applies only to samples taken after fasting for at least 8 hours.  Glucose, capillary     Status: Abnormal   Collection Time: 01/24/23  7:32 AM  Result Value Ref Range   Glucose-Capillary 113 (H) 70 - 99 mg/dL    Comment: Glucose reference range applies only to samples taken after fasting for at least 8 hours.   Comment 1 Notify RN    Comment 2 Document in Chart   HIV Antibody (routine testing w rflx)     Status: None   Collection Time: 01/24/23  8:14 AM  Result Value Ref Range   HIV Screen 4th Generation wRfx Non Reactive Non Reactive    Comment: Performed at [facility], [address], [place], [place] [address]  CBC     Status: Abnormal   Collection Time: 01/24/23  8:14 AM  Result Value Ref Range   WBC 9.8 4.0 - 10.5 K/uL   RBC 5.09 3.87 - 5.11 MIL/uL   Hemoglobin 15.1 (H) 12.0 - 15.0 g/dL   HCT 91.4 78.2 - 95.6 %   MCV 89.0 80.0 - 100.0 fL   MCH 29.7 26.0 - 34.0 pg   MCHC 33.3 30.0 - 36.0 g/dL   RDW 21.3 08.6 - 57.8 %   Platelets 228 150 - 400 K/uL   nRBC 0.0 0.0 - 0.2 %    Comment: Performed at [facility], [address] [place], [place] [address]  Basic metabolic panel     Status: Abnormal   Collection Time: 01/24/23  8:14 AM  Result Value Ref Range   Sodium 136 135 - 145 mmol/L   Potassium 3.2 (L) 3.5 - 5.1 mmol/L   Chloride 106 98 - 111 mmol/L   CO2 20 (L) 22 - 32 mmol/L   Glucose, Bld 119 (H) 70 - 99 mg/dL    Comment: Glucose reference range applies only to samples taken after fasting for at least 8 hours.   BUN 11 6 - 20 mg/dL   Creatinine, Ser 9.52 0.44 - 1.00 mg/dL   Calcium 8.2 (L) 8.9 - 10.3 mg/dL   GFR, Estimated >84 >13 mL/min    Comment: (NOTE) Calculated using the CKD-EPI Creatinine Equation (2021)    Anion gap 10 5 - 15    Comment: Performed at [facility], [address] [place], [place] [address]  Hemoglobin A1c  Status: Abnormal   Collection Time: 01/24/23  8:14 AM  Result Value Ref Range   Hgb A1c MFr Bld 6.0 (H) 4.8 - 5.6 %    Comment: (NOTE) Pre diabetes:          5.7%-6.4%  Diabetes:              >6.4%  Glycemic control for   <7.0% adults with diabetes    Mean Plasma Glucose 125.5 mg/dL    Comment: Performed at [facility], [address], [place], [place] [address]  TSH     Status: None   Collection Time: 01/24/23  8:16 AM  Result Value Ref Range   TSH 3.461 0.350 - 4.500 uIU/mL    Comment: Performed by a 3rd Generation assay with a functional sensitivity of <=0.01 uIU/mL. Performed at [facility], [address],  [place], [place] [address]   Glucose, capillary     Status: Abnormal   Collection Time: 01/24/23 11:15 AM  Result Value Ref Range   Glucose-Capillary 162 (H) 70 - 99 mg/dL    Comment: Glucose reference range applies only to samples taken after fasting for at least 8 hours.   Comment 1 Notify RN    Comment 2 Document in Chart   Glucose, capillary     Status: Abnormal   Collection Time: 01/24/23  3:14 PM  Result Value Ref Range   Glucose-Capillary 167 (H) 70 - 99 mg/dL    Comment: Glucose reference range applies only to samples taken after fasting for at least 8 hours.    Current Facility-Administered Medications  Medication Dose Route Frequency Provider Last Rate Last Admin   acetylcysteine (ACETADOTE) 18,000 mg in dextrose 5 % 590 mL (30.5085 mg/mL) infusion  15 mg/kg/hr Intravenous Continuous John Giovanni, MD 61.6 mL/hr at 01/24/23 1259 15 mg/kg/hr at 01/24/23 1259   amLODipine (NORVASC) tablet 5 mg  5 mg Oral Daily Hughie Closs, MD   5 mg at 01/24/23 0981   aspirin EC tablet 81 mg  81 mg Oral Daily Pahwani, Daleen Bo, MD   81 mg at 01/24/23 0834   cefTRIAXone (ROCEPHIN) 2 g in sodium chloride 0.9 % 100 mL IVPB  2 g Intravenous Q24H Hughie Closs, MD   Stopped at 01/24/23 0846   Chlorhexidine Gluconate Cloth 2 % PADS 6 each  6 each Topical Nightly John Giovanni, MD   6 each at 01/24/23 0550   enoxaparin (LOVENOX) injection 40 mg  40 mg Subcutaneous Daily Pahwani, Daleen Bo, MD   40 mg at 01/24/23 1500   furosemide (LASIX) tablet 40 mg  40 mg Oral Daily Hughie Closs, MD   40 mg at 01/24/23 0944   hydrALAZINE (APRESOLINE) injection 10 mg  10 mg Intravenous Q6H PRN Hughie Closs, MD   10 mg at 01/24/23 1005   insulin aspart (novoLOG) injection 0-9 Units  0-9 Units Subcutaneous Q4H John Giovanni, MD   2 Units at 01/24/23 1600   irbesartan (AVAPRO) tablet 150 mg  150 mg Oral Daily Hughie Closs, MD   150 mg at 01/24/23 0944   pantoprazole (PROTONIX) EC tablet 20 mg  20 mg Oral Daily  Pahwani, Daleen Bo, MD       potassium chloride SA (KLOR-CON M) CR tablet 40 mEq  40 mEq Oral Q4H Hughie Closs, MD   40 mEq at 01/24/23 1500   prazosin (MINIPRESS) capsule 2 mg  2 mg Oral QHS Hughie Closs, MD        Musculoskeletal: Strength &  Muscle Tone: normalwithin normal limits Gait & Station:  Patient leans: N/A            Psychiatric Specialty Exam:  Presentation  General Appearance:  Appropriate for Environment; Casual  Eye Contact: Good  Speech: Clear and Coherent; Normal Rate  Speech Volume: Normal  Handedness: Right   Mood and Affect  Mood: Dysphoric; Depressed  Affect: Tearful; Congruent   Thought Process  Thought Processes: Coherent; Goal Directed  Descriptions of Associations:Intact  Orientation:Full (Time, Place and Person)  Thought Content:WDL  History of Schizophrenia/Schizoaffective disorder:No data recorded Duration of Psychotic Symptoms:No data recorded Hallucinations:Hallucinations: None  Ideas of Reference:None  Suicidal Thoughts:Suicidal Thoughts: Yes, Active SI Active Intent and/or Plan: With Intent; With Plan; With Means to Carry Out; With Access to Means  Homicidal Thoughts:Homicidal Thoughts: No   Sensorium  Memory: Immediate Good; Remote Good; Recent Good  Judgment: Good  Insight: Fair   Executive Functions  Concentration: Good  Attention Span: Good  Recall: Good  Fund of Knowledge: Good  Language: Good   Psychomotor Activity  Psychomotor Activity: Psychomotor Activity: Normal   Assets  Assets: Communication Skills; Desire for Improvement; Financial Resources/Insurance; Physical Health; Leisure Time   Sleep  Sleep: Sleep: Good   Physical Exam: Physical Exam Vitals and nursing note reviewed.  Constitutional:      Appearance: Normal appearance. She is normal weight.  Neurological:     General: No focal deficit present.     Mental Status: She is alert and oriented to person,  place, and time. Mental status is at baseline.  Psychiatric:        Attention and Perception: Attention and perception normal.        Mood and Affect: Mood is anxious and depressed. Affect is tearful.        Speech: Speech normal.        Behavior: Behavior normal. Behavior is cooperative.        Thought Content: Thought content normal.        Cognition and Memory: Cognition and memory normal.        Judgment: Judgment is impulsive.    Review of Systems  Neurological:  Positive for seizures.  Psychiatric/Behavioral:  Positive for depression and suicidal ideas. The patient is nervous/anxious and has insomnia.   All other systems reviewed and are negative.  Blood pressure (!) 153/76, pulse 84, temperature 98.2 F (36.8 C), temperature source Oral, resp. rate 13, height 5\' 7"  (1.702 m), weight 106.9 kg, SpO2 94%. Body mass index is 36.91 kg/m.  Treatment Plan Summary: Daily contact with patient to assess and evaluate symptoms and progress in treatment, Medication management, and Plan    Once patient is determined to be medically stable will require inpatient psychiatric hospitalization.  She is currently willing to go voluntarily, however if patient decides to leave AGAINST MEDICAL ADVICE will need to be placed under IVC at that time. -Will order nicotine patch 21 mg daily. -Will not start or resume medication until medically stable. -QTc obtained from telemetry monitoring of 408 at the time of evaluation. -TOC referral assistance with community resources.  Labs reviewed and assessed determined to be within normal limits.  Urine positive for urinary tract infection, will defer to primary team.  Due to recent separation will order STI panel off urine specimen.  Urine drug screen positive for amphetamines and THC, although patient denies active use... Psychiatry consult service will continue to follow. Disposition: Recommend psychiatric Inpatient admission when medically cleared.  Maryagnes Amos,  FNP 01/24/2023 4:23 PM

## 2023-01-24 NOTE — ED Notes (Signed)
Bolus dose verified with pharmacy. Pump not programming bolus correctly due to bolus and continuous infusion being calculated using different weights per recommendations of poison control. Bolus dose of 150mg /kg to be given. Poison control recommended a max dose be calculated using 100kg, pt is 125.2 kg. Dose of 119.8mg /kg or 15,000mg  or 491.53ml to infuse over 60 minutes to be given.

## 2023-01-24 NOTE — Progress Notes (Signed)
Per chart review patient currently in [facility] SDU for substance abuse overdose. No PCP listed.  TOC will continue to follow for needs     01/24/23 1824  TOC Brief Assessment  Insurance and Status Reviewed  Patient has primary care physician No  Home environment has been reviewed from home with family  Prior level of function: independent  Prior/Current Home Services No current home services  Social Determinants of Health Reivew SDOH reviewed no interventions necessary  Readmission risk has been reviewed Yes  Transition of care needs transition of care needs identified, TOC will continue to follow

## 2023-01-24 NOTE — H&P (Signed)
History and Physical    [name]:[id number] DOB: [birth year] DOA: 01/23/2023  PCP: Patient, No Pcp Per  Patient coming from: Home  Chief Complaint: Intentional drug overdose/suicide attempt  HPI: Kaitlyn Chambers is a 42 y.o. female with medical history significant of chronic HFpEF, COPD, CAD status post PCI in 2017, hypertension, hyperlipidemia, hypothyroidism, type 2 diabetes, GERD, anxiety, bipolar disorder, depression, gout presented to the ED after intentional drug overdose/suicide attempt.  Patient took a large amount of Tylenol PM as well as some pills of gabapentin and Suboxone.  She was awake and alert on arrival to the ED, vital signs stable.  Labs showing WBC 13.7, hemoglobin 16.0, potassium 3.4, magnesium 2.4, bicarb 24, creatinine 0.8, glucose 118, normal LFTs, blood ethanol level <10, salicylate level <7.0, acetaminophen level 153> 172, INR 1.0, UDS positive for amphetamines and THC.  UA showing positive nitrite, small amount of leukocytes, and microscopy showing 21-50 WBCs and many bacteria.  EKG showing sinus rhythm and slight QT prolongation (QTc 494).  Poison control recommended starting NAC treatment.  Regarding her other substance overdoses, they recommended checking EKGs every 8 hours, seizure monitoring, and electrolyte optimization with Mag >2 and potassium >4.  Pharmacy consulted and patient was started on NAC treatment.  She also received Zofran, Compazine, and IV potassium 10 mEq x 4 in the ED.  Patient is somnolent but able to wake up.  States "I was stupid."  She reports taking a very large amount of Tylenol PM but unclear how many pills exactly. Denies taking Suboxone, gabapentin, or any other drugs.  Refuses to answer any additional questions.  Review of Systems:  Review of Systems  Reason unable to perform ROS: AMS.    Past Medical History:  Diagnosis Date   Anxiety    Bipolar 1 disorder, manic, full remission (HCC)    CHF (congestive heart failure) (HCC)     COPD (chronic obstructive pulmonary disease) (HCC)    Coronary artery disease    Depression    GERD (gastroesophageal reflux disease)    Gout    High cholesterol    Hypertension    Hypothyroidism    Metabolic syndrome    NSTEMI (non-ST elevated myocardial infarction) (HCC) 07/01/2016   On home oxygen therapy    "available; don't use it" (07/01/2016)   PCOS (polycystic ovarian syndrome)    Stomach ulcer    Type 2 diabetes, diet controlled (HCC)     Past Surgical History:  Procedure Laterality Date   CARDIAC CATHETERIZATION N/A 07/01/2016   Procedure: LEFT HEART CATH AND CORONARY ANGIOGRAPHY;  Surgeon: Kathleene Hazel, MD;  Location: [facility] INVASIVE CV LAB;  Service: Cardiovascular;  Laterality: N/A;   CARDIAC CATHETERIZATION N/A 07/01/2016   Procedure: Coronary Stent Intervention;  Surgeon: Kathleene Hazel, MD;  Location: [facility] INVASIVE CV LAB;  Service: Cardiovascular;  Laterality: N/A;   CARPAL TUNNEL RELEASE Bilateral    CERVICAL BIOPSY  W/ LOOP ELECTRODE EXCISION  05/2016   "precancerous cells"   CERVICAL BIOPSY  W/ LOOP ELECTRODE EXCISION     CORONARY ANGIOPLASTY WITH STENT PLACEMENT  07/01/2016   TONSILLECTOMY       reports that she has been smoking cigarettes. She has a 6.5 pack-year smoking history. She has never used smokeless tobacco. She reports that she does not currently use drugs after having used the following drugs: Marijuana. She reports that she does not drink alcohol.  Allergies  Allergen Reactions   Latex Swelling   Levaquin [Levofloxacin] Rash  Metoprolol Nausea And Vomiting and Other (See Comments)    Sweats and dizziness   Tape Rash and Other (See Comments)    Tears the skin, also    Family History  Problem Relation Age of Onset   Diabetes Mother    Heart failure Mother    Heart attack Mother 82   Heart disease Mother    Alcohol abuse Mother    Arthritis Mother    COPD Mother    Depression Mother    Drug abuse Mother     Hyperlipidemia Mother    Hypertension Mother    Intellectual disability Mother    Healthy Son    Alcohol abuse Father    Arthritis Father    Depression Father    Intellectual disability Father    Early death Father    Alcohol abuse Sister    Arthritis Sister    Depression Sister    Intellectual disability Sister    Intellectual disability Maternal Grandmother    Hypertension Maternal Grandmother    Hyperlipidemia Maternal Grandmother    Heart disease Maternal Grandmother    Diabetes Maternal Grandmother    Depression Maternal Grandmother    COPD Maternal Grandmother    Alcohol abuse Maternal Grandfather    Arthritis Maternal Grandfather    COPD Maternal Grandfather    Diabetes Maternal Grandfather    Heart attack Maternal Grandfather    Heart disease Maternal Grandfather    Hyperlipidemia Maternal Grandfather    Hypertension Maternal Grandfather    Intellectual disability Maternal Grandfather    Arthritis Sister     Prior to Admission medications   Medication Sig Start Date End Date Taking? Authorizing Provider  amLODipine (NORVASC) 5 MG tablet Take 1 tablet (5 mg total) by mouth daily. 05/26/22   Bethann Berkshire, MD  aspirin EC 81 MG EC tablet Take 1 tablet (81 mg total) by mouth daily. 07/04/16   Hongalgi, Maximino Greenland, MD  atorvastatin (LIPITOR) 80 MG tablet Take 80 mg by mouth daily.    [provider]  Blood Glucose Monitoring Suppl (ONE TOUCH ULTRA 2) w/Device KIT 1 kit by Does not apply route daily. 08/09/19   Waldon Merl, PA-C  cephALEXin (KEFLEX) 500 MG capsule Take 1 capsule (500 mg total) by mouth 2 (two) times daily. 09/24/22   Mardella Layman, MD  colchicine 0.6 MG tablet Take 2 tabs at onset of pain, then may repeat one dose in 2 hours if not resolving. Repeat daily until resolved. Max of 3 tabs daily 01/20/22   Particia Nearing, PA-C  empagliflozin (JARDIANCE) 25 MG TABS tablet Take 25 mg by mouth daily.    [provider]  fenofibrate (TRICOR)  145 MG tablet Take 1 tablet (145 mg total) by mouth daily. 07/19/19   Jake Bathe, MD  FLUoxetine (PROZAC) 40 MG capsule Take 1 capsule (40 mg total) by mouth daily. 05/26/22   Bethann Berkshire, MD  furosemide (LASIX) 40 MG tablet Take 1 tablet (40 mg total) by mouth as needed. 02/17/22   Mardella Layman, MD  glucose blood (ONETOUCH ULTRA) test strip Check blood sugars once daily  before breakfast (fasting) Dx: E11.9 08/15/19   Waldon Merl, PA-C  irbesartan (AVAPRO) 150 MG tablet Take 150 mg by mouth daily.    [provider]  Lancets (FREESTYLE) lancets Use as instructed 08/07/19   Waldon Merl, PA-C  Lurasidone HCl (LATUDA) 60 MG TABS Take 1 tablet (60 mg total) by mouth daily. 08/13/19   Daphine Deutscher,  Kristine Garbe, PA-C  MAGNESIUM PO Take 1 tablet by mouth daily.    [provider]  megestrol (MEGACE) 40 MG tablet Take 2 tablets (80 mg total) by mouth daily. 10/06/21   Wallis Bamberg, PA-C  nitroGLYCERIN (NITROSTAT) 0.4 MG SL tablet Place 0.4 mg under the tongue every 5 (five) minutes as needed for chest pain.    [provider]  ondansetron (ZOFRAN-ODT) 4 MG disintegrating tablet Take 1 tablet (4 mg total) by mouth every 8 (eight) hours as needed for nausea or vomiting. 09/24/22   Mardella Layman, MD  pantoprazole (PROTONIX) 20 MG tablet Take 1 tablet (20 mg total) by mouth daily. 09/24/22   Mardella Layman, MD  potassium chloride SA (KLOR-CON) 20 MEQ tablet Take 20 mEq by mouth daily as needed (cramps).    [provider]  praziquantel (BILTRICIDE) 600 MG tablet 5.5 tabkets now and repeat in 10 days 03/12/22   Daphine Deutscher, Mary-Margaret, FNP  prazosin (MINIPRESS) 1 MG capsule Take 2 mg by mouth at bedtime.    [provider]  Semaglutide, 1 MG/DOSE, (OZEMPIC, 1 MG/DOSE,) 2 MG/1.5ML SOPN Inject 1 mg into the skin every Tuesday.    [provider]  Simethicone (GAS-X PO) Take 2 tablets by mouth daily as needed (gas).    [provider]  gabapentin  (NEURONTIN) 300 MG capsule Take 300 mg by mouth 2 (two) times daily.  04/30/19  [provider]  temazepam (RESTORIL) 30 MG capsule Take 30 mg by mouth at bedtime.  01/31/18 04/30/19  [provider]  VYVANSE 70 MG capsule Take 70 mg by mouth daily.  01/31/18 03/13/19  [provider]    Physical Exam: Vitals:   01/23/23 2005 01/23/23 2230 01/24/23 0130 01/24/23 0240  BP:  (!) 164/102 (!) 170/154 (!) 168/86  Pulse:  68 71 65  Resp:  19 16 15   Temp:  97.9 F (36.6 C)  98.7 F (37.1 C)  TempSrc:    Oral  SpO2:  97% 95% 97%  Weight: 125.2 kg     Height: 5\' 7"  (1.702 m)       Physical Exam Vitals reviewed.  Constitutional:      General: She is not in acute distress. HENT:     Head: Normocephalic and atraumatic.  Cardiovascular:     Rate and Rhythm: Normal rate and regular rhythm.     Pulses: Normal pulses.  Pulmonary:     Effort: Pulmonary effort is normal. No respiratory distress.     Breath sounds: Normal breath sounds. No wheezing or rales.  Abdominal:     General: Bowel sounds are normal. There is no distension.     Palpations: Abdomen is soft.     Tenderness: There is no abdominal tenderness. There is no guarding.  Musculoskeletal:     Cervical back: Normal range of motion.     Right lower leg: No edema.     Left lower leg: No edema.  Skin:    General: Skin is warm and dry.  Neurological:     General: No focal deficit present.     Mental Status: She is alert and oriented to person, place, and time.     Comments: Somnolent but arousable Moving all extremities on command, no focal weakness     Labs on Admission: I have personally reviewed following labs and imaging studies  CBC: Recent Labs  Lab 01/23/23 1954  WBC 13.7*  NEUTROABS 8.9*  HGB 16.0*  HCT 46.6*  MCV 88.1  PLT 251   Basic Metabolic Panel: Recent Labs  Lab 01/23/23 1954  NA 137  K 3.4*  CL 103  CO2 24  GLUCOSE 118*  BUN 15  CREATININE 0.88  CALCIUM 9.1  MG 2.4    GFR: Estimated Creatinine Clearance: 112 mL/min (by C-G formula based on SCr of 0.88 mg/dL). Liver Function Tests: Recent Labs  Lab 01/23/23 1954  AST 16  ALT 17  ALKPHOS 64  BILITOT 0.8  PROT 8.1  ALBUMIN 4.2   No results for input(s): "LIPASE", "AMYLASE" in the last 168 hours. No results for input(s): "AMMONIA" in the last 168 hours. Coagulation Profile: Recent Labs  Lab 01/23/23 2200  INR 1.0   Cardiac Enzymes: No results for input(s): "CKTOTAL", "CKMB", "CKMBINDEX", "TROPONINI" in the last 168 hours. BNP (last 3 results) No results for input(s): "PROBNP" in the last 8760 hours. HbA1C: No results for input(s): "HGBA1C" in the last 72 hours. CBG: Recent Labs  Lab 01/23/23 1956  GLUCAP 112*   Lipid Profile: No results for input(s): "CHOL", "HDL", "LDLCALC", "TRIG", "CHOLHDL", "LDLDIRECT" in the last 72 hours. Thyroid Function Tests: No results for input(s): "TSH", "T4TOTAL", "FREET4", "T3FREE", "THYROIDAB" in the last 72 hours. Anemia Panel: No results for input(s): "VITAMINB12", "FOLATE", "FERRITIN", "TIBC", "IRON", "RETICCTPCT" in the last 72 hours. Urine analysis:    Component Value Date/Time   COLORURINE YELLOW 01/23/2023 2019   APPEARANCEUR HAZY (A) 01/23/2023 2019   LABSPEC 1.024 01/23/2023 2019   PHURINE 5.0 01/23/2023 2019   GLUCOSEU NEGATIVE 01/23/2023 2019   HGBUR NEGATIVE 01/23/2023 2019   BILIRUBINUR NEGATIVE 01/23/2023 2019   BILIRUBINUR negative 09/24/2022 0937   KETONESUR NEGATIVE 01/23/2023 2019   PROTEINUR NEGATIVE 01/23/2023 2019   UROBILINOGEN 1.0 09/24/2022 0937   UROBILINOGEN 1.0 04/30/2019 1634   NITRITE POSITIVE (A) 01/23/2023 2019   LEUKOCYTESUR SMALL (A) 01/23/2023 2019    Radiological Exams on Admission: No results found.  EKG: Independently reviewed.  Sinus rhythm, QTc 494.  Assessment and Plan  Intentional drug overdose (acetaminophen and diphenhydramine) Suicide attempt Polysubstance abuse History of depression,  anxiety, bipolar disorder Patient took a large amount of Tylenol PM.  EMS also found bottles of gabapentin and Suboxone but patient denies taking these.  UDS positive for amphetamines and THC.  She was awake and alert on arrival to the ED, vital signs stable.  Currently somnolent but arousable and following commands.  Acetaminophen level 153> 172.  Poison control was contacted by ED and patient started on NAC treatment, pharmacy following.  Continue to monitor acetaminophen level per protocol.  LFTs and PT/INR normal, continue to monitor.  Regarding diphenhydramine overdose, poison control recommended checking EKGs every 8 hours, seizure monitoring, and electrolyte optimization with keeping mag >2 and K >4.  Initial EKG showing slight QT prolongation (QTc 494).  Avoid QT prolonging drugs.  Ordered bladder scans every 6 hours to assess for urinary retention.  Psychiatry consulted, suicide precautions.  UTI UA showing positive nitrite, small amount of leukocytes, and microscopy showing 21-50 WBCs and many bacteria.  WBC count 13.7, no signs of sepsis.  Start ceftriaxone and order urine culture.  Monitor WBC count.  Mild hypokalemia Replace potassium and continue to monitor labs.  Magnesium level normal.  Chronic HFpEF Echo done in September 2018 showing EF 60 to 65%.  No signs of volume overload at this time.  Type 2 diabetes Last A1c 9.9 in February 2021, will repeat.  Sensitive sliding scale insulin.  COPD: Stable, no signs of acute exacerbation.  CAD status post PCI in 2017: EKG without acute ischemic changes. Hypertension Hyperlipidemia Hypothyroidism: Check TSH GERD Gout Pharmacy med rec pending.  DVT prophylaxis: SCDs Code Status: Full Code (patient does not have capacity for decision-making, no surrogate or prior directive available) Family Communication: No family available at this time. Level of care: Step Down Unit Admission status: It is my clinical opinion that referral for  OBSERVATION is reasonable and necessary in this patient based on the above information provided. The aforementioned taken together are felt to place the patient at high risk for further clinical deterioration. However, it is anticipated that the patient may be medically stable for discharge from the hospital within 24 to 48 hours.   John Giovanni MD [facility]  If 7PM-7AM, please contact night-coverage [email or web address]  01/24/2023, 3:40 AM

## 2023-01-24 NOTE — Progress Notes (Signed)
This is a 42 year old lady with many comorbidities who was admitted after midnight for intentional drug overdose and suicide attempt.  Please refer to H&P for details.  H&P, labs and imagings reviewed by myself.  Patient seen and examined in the ED.  She had sitter and her RN was at the bedside as well.  Patient was sleepy but easily arousable.  Oriented.  Did not have any complaint.  She reported that she took at least 120 tablets of acetaminophen but she could not tell me the dosage.  She denied taking any other medications.  She seemed very grateful for her actions.  She was looking forward to talking to psychiatry.  Her blood pressure is elevated.  I have resumed her home medications and have added as needed IV hydralazine.  Potassium is low which I will replace.
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
## 2023-01-25 ENCOUNTER — Encounter (HOSPITAL_COMMUNITY): Payer: Self-pay | Admitting: Family

## 2023-01-25 ENCOUNTER — Other Ambulatory Visit: Payer: Self-pay

## 2023-01-25 ENCOUNTER — Inpatient Hospital Stay (HOSPITAL_COMMUNITY): Payer: 59

## 2023-01-25 ENCOUNTER — Inpatient Hospital Stay (HOSPITAL_COMMUNITY)
Admission: AD | Admit: 2023-01-25 | Discharge: 2023-01-30 | DRG: 885 | Disposition: A | Discharge status: Home / Self Care | Payer: 59 | Source: Intra-hospital | Attending: Psychiatry | Admitting: Psychiatry

## 2023-01-25 DIAGNOSIS — Z0389 Encounter for observation for other suspected diseases and conditions ruled out: Secondary | ICD-10-CM | POA: Diagnosis not present

## 2023-01-25 DIAGNOSIS — G479 Sleep disorder, unspecified: Secondary | ICD-10-CM | POA: Diagnosis present

## 2023-01-25 DIAGNOSIS — F3163 Bipolar disorder, current episode mixed, severe, without psychotic features: Principal | ICD-10-CM

## 2023-01-25 DIAGNOSIS — E039 Hypothyroidism, unspecified: Secondary | ICD-10-CM | POA: Diagnosis not present

## 2023-01-25 DIAGNOSIS — F515 Nightmare disorder: Secondary | ICD-10-CM | POA: Diagnosis present

## 2023-01-25 DIAGNOSIS — E119 Type 2 diabetes mellitus without complications: Secondary | ICD-10-CM | POA: Diagnosis not present

## 2023-01-25 DIAGNOSIS — F4310 Post-traumatic stress disorder, unspecified: Secondary | ICD-10-CM | POA: Diagnosis present

## 2023-01-25 DIAGNOSIS — Z5982 Transportation insecurity: Secondary | ICD-10-CM | POA: Diagnosis not present

## 2023-01-25 DIAGNOSIS — F17210 Nicotine dependence, cigarettes, uncomplicated: Secondary | ICD-10-CM | POA: Diagnosis present

## 2023-01-25 DIAGNOSIS — I2510 Atherosclerotic heart disease of native coronary artery without angina pectoris: Secondary | ICD-10-CM | POA: Diagnosis present

## 2023-01-25 DIAGNOSIS — Z79899 Other long term (current) drug therapy: Secondary | ICD-10-CM

## 2023-01-25 DIAGNOSIS — Z5941 Food insecurity: Secondary | ICD-10-CM | POA: Diagnosis not present

## 2023-01-25 DIAGNOSIS — K5900 Constipation, unspecified: Secondary | ICD-10-CM | POA: Diagnosis present

## 2023-01-25 DIAGNOSIS — Z7984 Long term (current) use of oral hypoglycemic drugs: Secondary | ICD-10-CM

## 2023-01-25 DIAGNOSIS — I5032 Chronic diastolic (congestive) heart failure: Secondary | ICD-10-CM | POA: Diagnosis not present

## 2023-01-25 DIAGNOSIS — J449 Chronic obstructive pulmonary disease, unspecified: Secondary | ICD-10-CM | POA: Diagnosis not present

## 2023-01-25 DIAGNOSIS — N390 Urinary tract infection, site not specified: Secondary | ICD-10-CM | POA: Diagnosis not present

## 2023-01-25 DIAGNOSIS — Z818 Family history of other mental and behavioral disorders: Secondary | ICD-10-CM

## 2023-01-25 DIAGNOSIS — E7800 Pure hypercholesterolemia, unspecified: Secondary | ICD-10-CM | POA: Diagnosis present

## 2023-01-25 DIAGNOSIS — F3113 Bipolar disorder, current episode manic without psychotic features, severe: Secondary | ICD-10-CM

## 2023-01-25 DIAGNOSIS — Z955 Presence of coronary angioplasty implant and graft: Secondary | ICD-10-CM | POA: Diagnosis not present

## 2023-01-25 DIAGNOSIS — I252 Old myocardial infarction: Secondary | ICD-10-CM

## 2023-01-25 DIAGNOSIS — Z635 Disruption of family by separation and divorce: Secondary | ICD-10-CM | POA: Diagnosis not present

## 2023-01-25 DIAGNOSIS — F17200 Nicotine dependence, unspecified, uncomplicated: Secondary | ICD-10-CM | POA: Insufficient documentation

## 2023-01-25 DIAGNOSIS — I110 Hypertensive heart disease with heart failure: Secondary | ICD-10-CM | POA: Diagnosis not present

## 2023-01-25 DIAGNOSIS — T391X2A Poisoning by 4-Aminophenol derivatives, intentional self-harm, initial encounter: Secondary | ICD-10-CM | POA: Diagnosis present

## 2023-01-25 DIAGNOSIS — F332 Major depressive disorder, recurrent severe without psychotic features: Secondary | ICD-10-CM | POA: Insufficient documentation

## 2023-01-25 LAB — BASIC METABOLIC PANEL
Anion gap: 9 (ref 5–15)
BUN: 10 mg/dL (ref 6–20)
CO2: 18 mmol/L — ABNORMAL LOW (ref 22–32)
Calcium: 8.7 mg/dL — ABNORMAL LOW (ref 8.9–10.3)
Chloride: 106 mmol/L (ref 98–111)
Creatinine, Ser: 0.77 mg/dL (ref 0.44–1.00)
GFR, Estimated: >60 mL/min (ref >60)
Glucose, Bld: 163 mg/dL — ABNORMAL HIGH (ref 70–99)
Potassium: 3.6 mmol/L (ref 3.5–5.1)
Sodium: 133 mmol/L — ABNORMAL LOW (ref 135–145)

## 2023-01-25 LAB — PROTIME-INR
INR: 1.1 (ref 0.8–1.2)
Prothrombin Time: 14.3 seconds (ref 11.4–15.2)

## 2023-01-25 LAB — HEPATIC FUNCTION PANEL
ALT: 12 U/L (ref 0–44)
ALT: 15 U/L (ref 0–44)
AST: 12 U/L — ABNORMAL LOW (ref 15–41)
AST: 14 U/L — ABNORMAL LOW (ref 15–41)
Albumin: 3.3 g/dL — ABNORMAL LOW (ref 3.5–5.0)
Albumin: 3.7 g/dL (ref 3.5–5.0)
Alkaline Phosphatase: 46 U/L (ref 38–126)
Alkaline Phosphatase: 55 U/L (ref 38–126)
Bilirubin, Direct: 0.2 mg/dL (ref 0.0–0.2)
Bilirubin, Direct: <0.1 mg/dL (ref 0.0–0.2)
Indirect Bilirubin: 0.4 mg/dL (ref 0.3–0.9)
Total Bilirubin: 0.6 mg/dL (ref 0.3–1.2)
Total Bilirubin: 0.9 mg/dL (ref 0.3–1.2)
Total Protein: 6.8 g/dL (ref 6.5–8.1)
Total Protein: 7.4 g/dL (ref 6.5–8.1)

## 2023-01-25 LAB — RPR: RPR Ser Ql: NONREACTIVE

## 2023-01-25 LAB — PROCALCITONIN: Procalcitonin: <0.1 ng/mL

## 2023-01-25 LAB — GLUCOSE, CAPILLARY
Glucose-Capillary: 183 mg/dL — ABNORMAL HIGH (ref 70–99)
Glucose-Capillary: 168 mg/dL — ABNORMAL HIGH (ref 70–99)
Glucose-Capillary: 153 mg/dL — ABNORMAL HIGH (ref 70–99)
Glucose-Capillary: 99 mg/dL (ref 70–99)
Glucose-Capillary: 94 mg/dL (ref 70–99)

## 2023-01-25 LAB — ACETAMINOPHEN LEVEL: Acetaminophen (Tylenol), Serum: <10 ug/mL — ABNORMAL LOW (ref 10–30)

## 2023-01-25 LAB — URINE CULTURE: Culture: >=100000 — AB

## 2023-01-25 MED ORDER — TRAZODONE HCL 50 MG PO TABS
50.0000 mg | ORAL_TABLET | Freq: Every evening | ORAL | Status: DC | PRN
  Administered 2023-01-25: 50 mg via ORAL
  Filled 2023-01-25: qty 1

## 2023-01-25 MED ORDER — DIPHENHYDRAMINE HCL 25 MG PO CAPS: 50.0000 mg | ORAL_CAPSULE | Freq: Three times a day (TID) | ORAL | Status: DC | PRN

## 2023-01-25 MED ORDER — NITROFURANTOIN MONOHYD MACRO 100 MG PO CAPS: 100.0000 mg | ORAL_CAPSULE | Freq: Two times a day (BID) | ORAL | 0 refills | Status: DC

## 2023-01-25 MED ORDER — MAGNESIUM HYDROXIDE 400 MG/5ML PO SUSP: 30.0000 mL | Freq: Every day | ORAL | Status: DC | PRN

## 2023-01-25 MED ORDER — ACETAMINOPHEN 325 MG PO TABS: 650.0000 mg | ORAL_TABLET | Freq: Four times a day (QID) | ORAL | Status: DC | PRN

## 2023-01-25 MED ORDER — LACTATED RINGERS IV BOLUS
1000.0000 mL | Freq: Once | INTRAVENOUS | Status: AC
  Administered 2023-01-25: 1000 mL via INTRAVENOUS

## 2023-01-25 MED ORDER — LORAZEPAM 1 MG PO TABS
2.0000 mg | ORAL_TABLET | Freq: Three times a day (TID) | ORAL | Status: DC | PRN
  Administered 2023-01-28: 2 mg via ORAL
  Filled 2023-01-25: qty 2

## 2023-01-25 MED ORDER — DIPHENHYDRAMINE HCL 50 MG/ML IJ SOLN: 50.0000 mg | Freq: Three times a day (TID) | INTRAMUSCULAR | Status: DC | PRN

## 2023-01-25 MED ORDER — ALUM & MAG HYDROXIDE-SIMETH 200-200-20 MG/5ML PO SUSP: 30.0000 mL | ORAL | Status: DC | PRN

## 2023-01-25 MED ORDER — ONDANSETRON HCL 4 MG/2ML IJ SOLN
4.0000 mg | Freq: Once | INTRAMUSCULAR | Status: AC
  Administered 2023-01-25: 4 mg via INTRAVENOUS
  Filled 2023-01-25: qty 2

## 2023-01-25 MED ORDER — HALOPERIDOL 5 MG PO TABS: 5.0000 mg | ORAL_TABLET | Freq: Three times a day (TID) | ORAL | Status: DC | PRN

## 2023-01-25 MED ORDER — HALOPERIDOL LACTATE 5 MG/ML IJ SOLN: 5.0000 mg | Freq: Three times a day (TID) | INTRAMUSCULAR | Status: DC | PRN

## 2023-01-25 MED ORDER — LORAZEPAM 2 MG/ML IJ SOLN: 2.0000 mg | Freq: Three times a day (TID) | INTRAMUSCULAR | Status: DC | PRN

## 2023-01-25 MED ORDER — NICOTINE 14 MG/24HR TD PT24
14.0000 mg | MEDICATED_PATCH | Freq: Every day | TRANSDERMAL | Status: DC
  Administered 2023-01-26 – 2023-01-30 (×5): 14 mg via TRANSDERMAL
  Filled 2023-01-25 (×8): qty 1

## 2023-01-25 NOTE — TOC Progression Note (Addendum)
Transition of Care [facility] - Progression Note    Patient Details  Name: ANETTA OLVERA MRN: [id number] Date of Birth: 07/14/1980  [facility] of [facility] CM/SW Contact  Lavenia Atlas, RN Phone Number: 01/25/2023, 1:37 PM  Clinical Narrative:   Received secure chat re: available bed for patient at [facility].  This RN CM spoke with patient at bedside re: next level of care and voluntary admission and consent for treatment form. Patient has verbally agreed and signed consent and rider for transport to [facility]. Patient became tearful reporting she is concerned about her belongings. Her friend Waynetta Sandy was on the phone who reports she found the patient prior to coming into the hospital and will also help with securing her belongings. Patient request to speak with psych provider again and request to speak with chaplain again, this RNCM notified both.  Voluntary admission and consent form has been faxed and received by [facility]. Placed a copy of rider and consent form in patient's chart at nursing station.  TOC will continue to follow for needs.   - 2:06 pm This RNCM received secure chat from [facility] RN advising patient is not a true code 27.  - 2:50pm This RNCM spoke with Genesis who has seen patient at bedside.  [facility] has been called, for transport to [facility]. This RNCM provided [facility] ICU nursing secretary phone number to call once driver has arrived. Notified RN to have patient ready for transport. Ride waicer in patient's chart.  No additional TOC needs at this time.   Expected Discharge Plan: Psychiatric Hospital Barriers to Discharge: Continued Medical Work up  Expected Discharge Plan and Services In-house Referral: Chaplain Discharge Planning Services: CM Consult Post Acute Care Choice: NA Living arrangements for the past 2 months: Single Family Home                 DME Arranged: N/A DME Agency: NA       HH Arranged: NA HH Agency: NA         Social Determinants of Health (SDOH)  Interventions SDOH Screenings   Tobacco Use: High Risk (01/23/2023)    Readmission Risk Interventions    01/25/2023    1:33 PM  Readmission Risk Prevention Plan  Transportation Screening Complete  PCP or Specialist Appt within 3-5 Days Complete  HRI or Home Care Consult Complete  Social Work Consult for Recovery Care Planning/Counseling Complete  Palliative Care Screening Not Applicable  Medication Review Oceanographer) Complete
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
## 2023-01-25 NOTE — [facility] Group Notes (Signed)
[facility] Group Notes:  (Nursing/MHT/Case Management/Adjunct)  Date:  01/25/2023  Time:  2000 Type of Therapy:   Narcotics Anonymous Meeting  Participation Level:  Active  Participation Quality:  Appropriate, Attentive, Sharing, and Supportive  Affect:  Depressed and Tearful  Cognitive:  Alert  Insight:  Improving  Engagement in Group:  Engaged  Modes of Intervention:  Education and Support  Summary of Progress/Problems:  Marcille Buffy 01/25/2023, 9:55 PM
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
## 2023-01-25 NOTE — Progress Notes (Signed)
       CROSS COVER NOTE  NAME: Kaitlyn Chambers MRN: [id number] DOB : 1980/08/11 ATTENDING PHYSICIAN: Hughie Closs, MD    Date of Service   01/25/2023   HPI/Events of Note   Report/Request Notified of BP 78/50 MAP 55   Bedside eval Patient appears fatigued, she is warm with +2 radial and pedal pulses. Manual BP 86/58  HPI 42 y.o. female with medical history significant of chronic HFpEF, COPD, CAD status post PCI in 2017, hypertension, hyperlipidemia, hypothyroidism, type 2 diabetes, GERD, anxiety, bipolar disorder, depression, gout presented to the ED after intentional drug overdose/suicide attempt.   ECHO 2018 shows LVEF 60-65%   Interventions   Assessment/Plan: 1L LR, monitor respiratory status       To reach the provider On-Call:   7AM- 7PM see care teams to locate the attending and reach out to them via [email or web address] 7PM-7AM contact night-coverage If you still have difficulty reaching the appropriate provider, please page the [facility] (Director on Call) for [facility] on amion for assistance  This document was prepared using Conservation officer, historic buildings and may include unintentional dictation errors.  Bishop Limbo DNP, MBA, FNP-BC, PMHNP-BC Nurse Practitioner [facility] Pager [phone]

## 2023-01-25 NOTE — Plan of Care (Signed)
  Problem: Education: Goal: Knowledge of [facility] Education information/materials will improve Outcome: Progressing Goal: Emotional status will improve Outcome: Progressing Goal: Mental status will improve Outcome: Progressing Goal: Verbalization of understanding the information provided will improve Outcome: Progressing   

## 2023-01-25 NOTE — Progress Notes (Signed)
   01/25/23 2230  Psych Admission Type (Psych Patients Only)  Admission Status Voluntary  Psychosocial Assessment  Patient Complaints Anger;Depression;Worrying;Insomnia  Eye Contact Fair  Facial Expression Flat  Affect Depressed  Speech Logical/coherent  Interaction Assertive  Motor Activity Other (Comment) (WDL)  Appearance/Hygiene Unremarkable  Behavior Characteristics Cooperative;Appropriate to situation  Mood Depressed  Thought Process  Coherency WDL  Content Blaming others  Delusions None reported or observed  Perception WDL  Hallucination None reported or observed  Judgment Impaired  Confusion None  Danger to Self  Current suicidal ideation? Denies  Agreement Not to Harm Self Yes  Description of Agreement verbal  Danger to Others  Danger to Others None reported or observed    Pt was offered support and encouragement. Pt was given PRN Trazodone per MAR. Q 15 minute checks were done for safety. Pt attended group and interacts well with peers and staff.  Pt has no complaints. Pt receptive to treatment and safety maintained on unit.

## 2023-01-25 NOTE — Discharge Summary (Signed)
Physician Discharge Summary  Kaitlyn Chambers JXB:[id number] DOB: [birth year] DOA: 01/23/2023  PCP: Patient, No Pcp Per  Admit date: 01/23/2023 Discharge date: 01/25/2023 30 Day Unplanned Readmission Risk Score    Flowsheet Row ED to Hosp-Admission (Current) from 01/23/2023 in [facility]ICU/STEPDOWN  30 Day Unplanned Readmission Risk Score (%) 23.94 Filed at 01/25/2023 1200       This score is the patient's risk of an unplanned readmission within 30 days of being discharged (0 -100%). The score is based on dignosis, age, lab data, medications, orders, and past utilization.   Low:  0-14.9   Medium: 15-21.9   High: 22-29.9   Extreme: 30 and above          Admitted From: Home Disposition: Behavioral health  Recommendations for Outpatient Follow-up:  Follow up with PCP in 1-2 weeks Please obtain BMP/CBC in one week Please follow up with your PCP on the following pending results: Unresulted Labs (From admission, onward)    None         Home Health: None Equipment/Devices: None  Discharge Condition: Stable CODE STATUS: Full code Diet recommendation: Cardiac  Subjective: Seen and examined.  Feels much better.  Feels motivated.  No complaints.  She is looking forward to admission to behavioral health.  Brief/Interim Summary: This is a 42 year old with medical history significant of chronic HFpEF, COPD, CAD status post PCI in 2017, hypertension, hyperlipidemia, hypothyroidism, type 2 diabetes, GERD, anxiety, bipolar disorder, depression, gout presented to the ED after intentional drug overdose/suicide attempt. She reported that she took at least 120 tablets of acetaminophen but she could not tell me the dosage. She denied taking any other medications. She seemed regretful for her actions.  She was started on NAC per poison control.  This was stopped last night.  She also had prolonged QTc but that was mild.  She was monitored on telemetry without any issues.   Eventually her NAC drip was stopped last night.  She was also assessed by psychiatry yesterday and they recommended hospitalization at inpatient behavioral health once medically cleared.  Patient is much more alert and oriented and motivated today.  Her QTc is slightly better.  She was also treated with UTI with Rocephin.  Urine culture is growing E. coli but sensitivities are pending.  Previously in March 2024, she had UTI and she grew E. coli which was pansensitive.  Based on that, I am going to discharge her on Macrobid for 5 days.  She is medically stable.  I have been informed by psychiatry that she will have a bed at behavioral health this afternoon so we are going to discharge her.  She also came in with elevated blood pressure and we resumed her home medications however it is not clear whether she was taking those medications at home or not.  Discharge plan was discussed with patient and/or family member and they verbalized understanding and agreed with it.  Discharge Diagnoses:  Principal Problem:   Acetaminophen overdose Active Problems:   Hypertension   Bipolar 1 disorder (HCC)   Diabetes mellitus (HCC)   Dyslipidemia   CAD (coronary artery disease)   Diphenhydramine overdose   Suicide attempt [facility])   UTI (urinary tract infection)   Drug overdose    Discharge Instructions   Allergies as of 01/25/2023       Reactions   Latex Swelling   Levaquin [levofloxacin] Rash   Metoprolol Nausea And Vomiting, Other (See Comments)   Sweats and dizziness   Tape  Rash, Other (See Comments)   Tears the skin, also        Medication List     STOP taking these medications    cephALEXin 500 MG capsule Commonly known as: KEFLEX   colchicine 0.6 MG tablet   GAS-X PO   Latuda 60 MG Tabs Generic drug: Lurasidone HCl   MAGNESIUM PO   megestrol 40 MG tablet Commonly known as: MEGACE   nitroGLYCERIN 0.4 MG SL tablet Commonly known as: NITROSTAT   ondansetron 4 MG disintegrating  tablet Commonly known as: ZOFRAN-ODT   Ozempic (1 MG/DOSE) 2 MG/1.5ML Sopn Generic drug: Semaglutide (1 MG/DOSE)   pantoprazole 20 MG tablet Commonly known as: PROTONIX   potassium chloride SA 20 MEQ tablet Commonly known as: KLOR-CON M       TAKE these medications    amLODipine 5 MG tablet Commonly known as: NORVASC Take 1 tablet (5 mg total) by mouth daily.   aspirin EC 81 MG tablet Take 1 tablet (81 mg total) by mouth daily.   atorvastatin 80 MG tablet Commonly known as: LIPITOR Take 80 mg by mouth daily.   empagliflozin 25 MG Tabs tablet Commonly known as: JARDIANCE Take 25 mg by mouth daily.   fenofibrate 145 MG tablet Commonly known as: TRICOR Take 1 tablet (145 mg total) by mouth daily.   FLUoxetine 40 MG capsule Commonly known as: PROzac Take 1 capsule (40 mg total) by mouth daily.   freestyle lancets Use as instructed   furosemide 40 MG tablet Commonly known as: LASIX Take 1 tablet (40 mg total) by mouth as needed.   irbesartan 150 MG tablet Commonly known as: AVAPRO Take 150 mg by mouth daily.   nitrofurantoin (macrocrystal-monohydrate) 100 MG capsule Commonly known as: Macrobid Take 1 capsule (100 mg total) by mouth 2 (two) times daily for 5 days.   ONE TOUCH ULTRA 2 w/Device Kit 1 kit by Does not apply route daily.   OneTouch Ultra test strip Generic drug: glucose blood Check blood sugars once daily  before breakfast (fasting) Dx: E11.9   praziquantel 600 MG tablet Commonly known as: BILTRICIDE 5.5 tabkets now and repeat in 10 days   prazosin 1 MG capsule Commonly known as: MINIPRESS Take 2 mg by mouth at bedtime.        Follow-up Information     PCP Follow up in 1 week(s).                 Allergies  Allergen Reactions   Latex Swelling   Levaquin [Levofloxacin] Rash   Metoprolol Nausea And Vomiting and Other (See Comments)    Sweats and dizziness   Tape Rash and Other (See Comments)    Tears the skin, also     Consultations: Psychiatry   Procedures/Studies: DG CHEST PORT 1 VIEW  Result Date: 01/25/2023 CLINICAL DATA:  Follow-up pneumonia EXAM: PORTABLE CHEST 1 VIEW COMPARISON:  12/03/2018 FINDINGS: The heart size and mediastinal contours are within normal limits. Both lungs are clear. The visualized skeletal structures are unremarkable. IMPRESSION: No active disease. Electronically Signed   By: Paulina Fusi M.D.   On: 01/25/2023 13:30     Discharge Exam: Vitals:   01/25/23 1133 01/25/23 1200  BP: 119/66 119/66  Pulse:    Resp: 19 19  Temp: 98.4 F (36.9 C) 98.4 F (36.9 C)  SpO2:  99%   Vitals:   01/25/23 1000 01/25/23 1100 01/25/23 1133 01/25/23 1200  BP: 117/85 107/81 119/66 119/66  Pulse:  Resp: 19 13 19 19   Temp:   98.4 F (36.9 C) 98.4 F (36.9 C)  TempSrc:   Oral Oral  SpO2:    99%  Weight:      Height:        General: Pt is alert, awake, not in acute distress Cardiovascular: RRR, S1/S2 +, no rubs, no gallops Respiratory: CTA bilaterally, no wheezing, no rhonchi Abdominal: Soft, NT, ND, bowel sounds + Extremities: no edema, no cyanosis    The results of significant diagnostics from this hospitalization (including imaging, microbiology, ancillary and laboratory) are listed below for reference.     Microbiology: Recent Results (from the past 240 hour(s))  Urine Culture (for pregnant, neutropenic or urologic patients or patients with an indwelling urinary catheter)     Status: Abnormal (Preliminary result)   Collection Time: 01/24/23  4:45 AM   Specimen: Urine, Clean Catch  Result Value Ref Range Status   Specimen Description   Final    URINE, CLEAN CATCH Performed at [facility], [address], [place], [place] [address]    Special Requests   Final    NONE Performed at [facility], [address] [place], [place] [address]    Culture >=100,000 COLONIES/mL ESCHERICHIA COLI (A)  Final   Report Status PENDING   Incomplete  MRSA Next Gen by PCR, Nasal     Status: None   Collection Time: 01/24/23  5:55 AM   Specimen: Nasal Mucosa; Nasal Swab  Result Value Ref Range Status   MRSA by PCR Next Gen NOT DETECTED NOT DETECTED Final    Comment: (NOTE) The GeneXpert MRSA Assay (FDA approved for NASAL specimens only), is one component of a comprehensive MRSA colonization surveillance program. It is not intended to diagnose MRSA infection nor to guide or monitor treatment for MRSA infections. Test performance is not FDA approved in patients less than 62 years old. Performed at [facility], [address] [place], [place] [address]      Labs: BNP (last 3 results) No results for input(s): "BNP" in the last 8760 hours. Basic Metabolic Panel: Recent Labs  Lab 01/23/23 1954 01/24/23 0814 01/25/23 0319  NA 137 136 133*  K 3.4* 3.2* 3.6  CL 103 106 106  CO2 24 20* 18*  GLUCOSE 118* 119* 163*  BUN 15 11 10   CREATININE 0.88 0.62 0.77  CALCIUM 9.1 8.2* 8.7*  MG 2.4  --   --    Liver Function Tests: Recent Labs  Lab 01/23/23 1954 01/24/23 2342 01/25/23 0316  AST 16 14* 12*  ALT 17 15 12   ALKPHOS 64 55 46  BILITOT 0.8 0.9 0.6  PROT 8.1 7.4 6.8  ALBUMIN 4.2 3.7 3.3*   No results for input(s): "LIPASE", "AMYLASE" in the last 168 hours. No results for input(s): "AMMONIA" in the last 168 hours. CBC: Recent Labs  Lab 01/23/23 1954 01/24/23 0814  WBC 13.7* 9.8  NEUTROABS 8.9*  --   HGB 16.0* 15.1*  HCT 46.6* 45.3  MCV 88.1 89.0  PLT 251 228   Cardiac Enzymes: No results for input(s): "CKTOTAL", "CKMB", "CKMBINDEX", "TROPONINI" in the last 168 hours. BNP: Invalid input(s): "POCBNP" CBG: Recent Labs  Lab 01/24/23 2000 01/25/23 0108 01/25/23 0318 01/25/23 0413 01/25/23 0749  GLUCAP 120* 183* 168* 153* 99   D-Dimer No results for input(s): "DDIMER" in the last 72 hours. Hgb A1c Recent Labs    01/24/23 0814  HGBA1C 6.0*   Lipid Profile No  results for  input(s): "CHOL", "HDL", "LDLCALC", "TRIG", "CHOLHDL", "LDLDIRECT" in the last 72 hours. Thyroid function studies Recent Labs    01/24/23 0816  TSH 3.461   Anemia work up No results for input(s): "VITAMINB12", "FOLATE", "FERRITIN", "TIBC", "IRON", "RETICCTPCT" in the last 72 hours. Urinalysis    Component Value Date/Time   COLORURINE YELLOW 01/23/2023 2019   APPEARANCEUR HAZY (A) 01/23/2023 2019   LABSPEC 1.024 01/23/2023 2019   PHURINE 5.0 01/23/2023 2019   GLUCOSEU NEGATIVE 01/23/2023 2019   HGBUR NEGATIVE 01/23/2023 2019   BILIRUBINUR NEGATIVE 01/23/2023 2019   BILIRUBINUR negative 09/24/2022 0937   KETONESUR NEGATIVE 01/23/2023 2019   PROTEINUR NEGATIVE 01/23/2023 2019   UROBILINOGEN 1.0 09/24/2022 0937   UROBILINOGEN 1.0 04/30/2019 1634   NITRITE POSITIVE (A) 01/23/2023 2019   LEUKOCYTESUR SMALL (A) 01/23/2023 2019   Sepsis Labs Recent Labs  Lab 01/23/23 1954 01/24/23 0814  WBC 13.7* 9.8   Microbiology Recent Results (from the past 240 hour(s))  Urine Culture (for pregnant, neutropenic or urologic patients or patients with an indwelling urinary catheter)     Status: Abnormal (Preliminary result)   Collection Time: 01/24/23  4:45 AM   Specimen: Urine, Clean Catch  Result Value Ref Range Status   Specimen Description   Final    URINE, CLEAN CATCH Performed at [facility], [address] [place], [place] [address]    Special Requests   Final    NONE Performed at [facility], [address] [place], [place] [address]    Culture >=100,000 COLONIES/mL ESCHERICHIA COLI (A)  Final   Report Status PENDING  Incomplete  MRSA Next Gen by PCR, Nasal     Status: None   Collection Time: 01/24/23  5:55 AM   Specimen: Nasal Mucosa; Nasal Swab  Result Value Ref Range Status   MRSA by PCR Next Gen NOT DETECTED NOT DETECTED Final    Comment: (NOTE) The GeneXpert MRSA Assay (FDA approved for NASAL specimens only), is one component of  a comprehensive MRSA colonization surveillance program. It is not intended to diagnose MRSA infection nor to guide or monitor treatment for MRSA infections. Test performance is not FDA approved in patients less than 25 years old. Performed at [facility], [address], [place], [place] [address]     FURTHER DISCHARGE INSTRUCTIONS:   Get Medicines reviewed and adjusted: Please take all your medications with you for your next visit with your Primary MD   Laboratory/radiological data: Please request your Primary MD to go over all hospital tests and procedure/radiological results at the follow up, please ask your Primary MD to get all Hospital records sent to his/her office.   In some cases, they will be blood work, cultures and biopsy results pending at the time of your discharge. Please request that your primary care M.D. goes through all the records of your hospital data and follows up on these results.   Also Note the following: If you experience worsening of your admission symptoms, develop shortness of breath, life threatening emergency, suicidal or homicidal thoughts you must seek medical attention immediately by calling 911 or calling your MD immediately  if symptoms less severe.   You must read complete instructions/literature along with all the possible adverse reactions/side effects for all the Medicines you take and that have been prescribed to you. Take any new Medicines after you have completely understood and accpet all the possible adverse reactions/side effects.    Do not drive when  taking Pain medications or sleeping medications (Benzodaizepines)   Do not take more than prescribed Pain, Sleep and Anxiety Medications. It is not advisable to combine anxiety,sleep and pain medications without talking with your primary care practitioner   Special Instructions: If you have smoked or chewed Tobacco  in the last 2 yrs please stop smoking, stop any regular  Alcohol  and or any Recreational drug use.   Wear Seat belts while driving.   Please note: You were cared for by a hospitalist during your hospital stay. Once you are discharged, your primary care physician will handle any further medical issues. Please note that NO REFILLS for any discharge medications will be authorized once you are discharged, as it is imperative that you return to your primary care physician (or establish a relationship with a primary care physician if you do not have one) for your post hospital discharge needs so that they can reassess your need for medications and monitor your lab values  Time coordinating discharge: Over 30 minutes  SIGNED:   Hughie Closs, MD  [facility] 01/25/2023, 1:45 PM *Please note that this is a verbal dictation therefore any spelling or grammatical errors are due to the "Dragon Medical One" system interpretation. If 7PM-7AM, please contact night-coverage [email or web address]

## 2023-01-25 NOTE — Progress Notes (Signed)
Patient to be discharged via safe transport. Patient belongings removed from locker at secretary's desk; pill bottles containing the following medications found in patient belonging bag:  Loperamide 2mg  blisterpack x4 Hydrocodone/acetaminophen 5-325mg  - 1 tablet Cephalexin 500mg  Amoxicillin 500mg  Ibuprofen 800mg  Ondansetron 4mg  tablet Bottle labeled as OTC acetaminophen containing at least three different kinds of capsules (yellow; light & dark blue; pink & dark blue)

## 2023-01-25 NOTE — Progress Notes (Signed)
Pt has been accepted to [facility] TODAY 01/25/2023, pending signed voluntary consent faxed to [phone]. Bed assignment: 306-1  Pt meets inpatient criteria per Caryn Bee, NP  Attending Physician will be Phineas Inches, MD  Report can be called to: - Adult unit: [phone]  Pt can arrive after pending items are received  Care Team Notified: [facility] Rona Ravens, RN, Myrlene Broker, RN, Karoline Caldwell, RN, Caryn Bee, NP, Ulis Rias, RN, and Pete Glatter, RN  Dejai Schubach, LCSW  01/25/2023 1:36 PM

## 2023-01-25 NOTE — Progress Notes (Signed)
Patient started on N-acetylcysteine on 7/22 for APAP overdose  Around 12a on 7/24, Pharmacist or RN spoke with [facility] Poison Control, discussed follow-up labs, and was given approval from [facility] to stop NAC infusion NAC infusion turned off at 0030 today No documentation was placed in chart at the time, but I did call [facility] at 1p today to confirm the above  Bernadene Person, PharmD, BCPS [phone] 01/25/2023, 1:07 PM

## 2023-01-25 NOTE — Progress Notes (Signed)
Medication bottles found in patient belongings bag in locker at nursing station when preparing patient for discharge.  Medication bottles given to safe transport by this RN and advised transporter to give to intake at [facility].  Ulis Rias, RN 01/25/2023 3:19 PM

## 2023-01-25 NOTE — Plan of Care (Signed)
  Problem: Education: Goal: Verbalization of understanding the information provided will improve Outcome: Progressing   Problem: Activity: Goal: Interest or engagement in activities will improve Outcome: Progressing   Problem: Safety: Goal: Periods of time without injury will increase Outcome: Progressing

## 2023-01-25 NOTE — Tx Team (Signed)
Initial Treatment Plan 01/25/2023 4:11 PM Kaitlyn Chambers JYN:[id number]    PATIENT STRESSORS: Health problems   Marital or family conflict     PATIENT STRENGTHS: Ability for insight    PATIENT IDENTIFIED PROBLEMS: Spouse left her                     DISCHARGE CRITERIA:  Ability to meet basic life and health needs Improved stabilization in mood, thinking, and/or behavior  PRELIMINARY DISCHARGE PLAN: Return to previous living arrangement Return to previous work or school arrangements  PATIENT/FAMILY INVOLVEMENT: This treatment plan has been presented to and reviewed with the patient, Kaitlyn Chambers. The patient has been given the opportunity to ask questions and make suggestions.  Cherre Blanc, RN 01/25/2023, 4:11 PM

## 2023-01-26 DIAGNOSIS — F17200 Nicotine dependence, unspecified, uncomplicated: Secondary | ICD-10-CM | POA: Insufficient documentation

## 2023-01-26 DIAGNOSIS — F3163 Bipolar disorder, current episode mixed, severe, without psychotic features: Principal | ICD-10-CM

## 2023-01-26 LAB — GLUCOSE, CAPILLARY
Glucose-Capillary: 170 mg/dL — ABNORMAL HIGH (ref 70–99)
Glucose-Capillary: 100 mg/dL — ABNORMAL HIGH (ref 70–99)

## 2023-01-26 LAB — URINE CULTURE

## 2023-01-26 LAB — GC/CHLAMYDIA PROBE AMP (~~LOC~~) NOT AT ARMC
Chlamydia: NEGATIVE
Comment: NEGATIVE
Comment: NEGATIVE
Comment: NORMAL
Neisseria Gonorrhea: NEGATIVE
Trichomonas: POSITIVE — AB

## 2023-01-26 MED ORDER — TRAZODONE HCL 50 MG PO TABS
50.0000 mg | ORAL_TABLET | Freq: Every day | ORAL | Status: DC
  Administered 2023-01-26 – 2023-01-29 (×4): 50 mg via ORAL
  Filled 2023-01-26 (×5): qty 1

## 2023-01-26 MED ORDER — ARIPIPRAZOLE 5 MG PO TABS
5.0000 mg | ORAL_TABLET | Freq: Every day | ORAL | Status: AC
  Administered 2023-01-26 – 2023-01-27 (×2): 5 mg via ORAL
  Filled 2023-01-26 (×2): qty 1

## 2023-01-26 MED ORDER — ATORVASTATIN CALCIUM 80 MG PO TABS
80.0000 mg | ORAL_TABLET | Freq: Every day | ORAL | Status: DC
  Administered 2023-01-26 – 2023-01-30 (×5): 80 mg via ORAL
  Filled 2023-01-26 (×6): qty 1

## 2023-01-26 MED ORDER — IRBESARTAN 150 MG PO TABS
150.0000 mg | ORAL_TABLET | Freq: Every day | ORAL | Status: DC
  Administered 2023-01-26 – 2023-01-30 (×5): 150 mg via ORAL
  Filled 2023-01-26 (×6): qty 1

## 2023-01-26 MED ORDER — FUROSEMIDE 40 MG PO TABS
40.0000 mg | ORAL_TABLET | Freq: Every day | ORAL | Status: DC
  Administered 2023-01-26 – 2023-01-30 (×5): 40 mg via ORAL
  Filled 2023-01-26 (×6): qty 1

## 2023-01-26 MED ORDER — PRAZOSIN HCL 2 MG PO CAPS
2.0000 mg | ORAL_CAPSULE | Freq: Every day | ORAL | Status: DC
  Administered 2023-01-26 – 2023-01-29 (×4): 2 mg via ORAL
  Filled 2023-01-26 (×5): qty 1

## 2023-01-26 MED ORDER — NITROFURANTOIN MONOHYD MACRO 100 MG PO CAPS
100.0000 mg | ORAL_CAPSULE | Freq: Two times a day (BID) | ORAL | Status: DC
  Administered 2023-01-26 – 2023-01-30 (×8): 100 mg via ORAL
  Filled 2023-01-26 (×11): qty 1

## 2023-01-26 MED ORDER — EMPAGLIFLOZIN 25 MG PO TABS
25.0000 mg | ORAL_TABLET | Freq: Every day | ORAL | Status: DC
  Administered 2023-01-27 – 2023-01-30 (×4): 25 mg via ORAL
  Filled 2023-01-26 (×6): qty 1

## 2023-01-26 MED ORDER — ARIPIPRAZOLE 10 MG PO TABS
10.0000 mg | ORAL_TABLET | Freq: Every day | ORAL | Status: DC
  Administered 2023-01-28 – 2023-01-30 (×3): 10 mg via ORAL
  Filled 2023-01-26 (×4): qty 1

## 2023-01-26 MED ORDER — ASPIRIN 81 MG PO TBEC
81.0000 mg | DELAYED_RELEASE_TABLET | Freq: Every day | ORAL | Status: DC
  Administered 2023-01-26 – 2023-01-30 (×5): 81 mg via ORAL
  Filled 2023-01-26 (×6): qty 1

## 2023-01-26 MED ORDER — FENOFIBRATE 160 MG PO TABS
160.0000 mg | ORAL_TABLET | Freq: Every day | ORAL | Status: DC
  Administered 2023-01-26 – 2023-01-30 (×5): 160 mg via ORAL
  Filled 2023-01-26 (×6): qty 1

## 2023-01-26 MED ORDER — HYDROXYZINE HCL 25 MG PO TABS: 25.0000 mg | ORAL_TABLET | Freq: Three times a day (TID) | ORAL | Status: DC | PRN
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
## 2023-01-26 NOTE — Group Note (Signed)
Date:  01/26/2023 Time:  10:01 AM  Group Topic/Focus:  Goals Group:   The focus of this group is to help patients establish daily goals to achieve during treatment and discuss how the patient can incorporate goal setting into their daily lives to aide in recovery.    Participation Level:  Active  Participation Quality:  Appropriate  Affect:  Appropriate  Cognitive:  Appropriate  Insight: Appropriate  Engagement in Group:  Engaged  Modes of Intervention:  Discussion   Additional Comments:     Reymundo Poll 01/26/2023, 10:01 AM

## 2023-01-26 NOTE — Progress Notes (Signed)
   01/26/23 0600  15 Minute Checks  Location Bedroom  Visual Appearance Calm  Behavior Sleeping  Sleep (Behavioral Health Patients Only)  Calculate sleep? (Click Yes once per 24 hr at 0600 safety check) Yes  Documented sleep last 24 hours 7.25

## 2023-01-26 NOTE — H&P (Addendum)
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 needs.  I personally was present and performed or re-performed the history, physical exam and medical decision-making activities of this service and have verified that the service and findings are accurately documented in the student's note, as addended by me or notated below: Patient with history of bipolar disorder and PTSD was admitted after attempted suicide by overdosing on Tylenol PM, she reports symptoms consistent with mixed episode of bipolar disorder decreased sleep impulsive behavior worsening depression related to stress secondary to separation as well as having unsafe living situation since her husband kicked her out 2 months ago has been staying with roommates and what was described as unsafe environment. She reports on and off marijuana use and probably  marijuana was laced by meth per her report. She also reports using Klonopin prescribed to her by an online provider that she reports she feels at [employer] in [place] but it is not recorded when I reviewed [facility] so I am unable to confirm. Upon evaluation she presents with labile mood and affect easily crying, agrees with plan to start trial of Abilify 5 mg to be titrated to 10 mg daily for mood stabilization and to help with depressed mood as well as restart her prazosin 2 mg at bedtime for nightmares, will also start Vistaril as needed for anxiety and trazodone scheduled as well as as needed for sleep. She has been noncompliant with her psychotropic as well as medical medications, will also restart her prescribed medications for diabetes hypertension heart failure and hyperlipidemia. She was diagnosed with UTI in the emergency room, will start Macrobid 100 mg twice daily for 7 days. Patient was counseled regarding need to abstain from marijuana and illicit drug use after discharge. Patient reports  good support from her sister who plans to pay for her to stay in a motel room for months until she is able to find her and has a place to stay. Discussed with patient recommendation to attend groups to help with coping skills and stressors, she agrees.    Marv Alfrey Abbott Pao, MD Psychiatrist

## 2023-01-26 NOTE — [facility] Suicide Risk Assessment (Signed)
[facility] Admission Suicide Risk Assessment   Nursing information obtained from:  Patient Demographic factors:  Caucasian, Low socioeconomic status, Unemployed Current Mental Status:  NA Loss Factors:  Loss of significant relationship, Decline in physical health, Financial problems / change in socioeconomic status Historical Factors:  Impulsivity Risk Reduction Factors:  Positive social support  Total Time spent with patient: 1 hour Principal Problem: MDD (major depressive disorder), recurrent episode, severe (HCC) Diagnosis:  Principal Problem:   MDD (major depressive disorder), recurrent episode, severe (HCC) Active Problems:   Bipolar I disorder, current or most recent episode manic, severe (HCC)  Subjective Data: see H&P  Continued Clinical Symptoms:  Alcohol Use Disorder Identification Test Final Score (AUDIT): 0 The "Alcohol Use Disorders Identification Test", Guidelines for Use in Primary Care, Second Edition.  World Science writer [facility]). Score between 0-7:  no or low risk or alcohol related problems. Score between 8-15:  moderate risk of alcohol related problems. Score between 16-19:  high risk of alcohol related problems. Score 20 or above:  warrants further diagnostic evaluation for alcohol dependence and treatment.   CLINICAL FACTORS:   Bipolar Disorder:   Mixed State   Musculoskeletal: Strength & Muscle Tone: within normal limits Gait & Station: normal Patient leans: N/A  Psychiatric Specialty Exam:  Presentation  General Appearance:  Appropriate for Environment; Casual  Eye Contact: Good  Speech: Clear and Coherent; Normal Rate  Speech Volume: Normal  Handedness: Right   Mood and Affect  Mood: Dysphoric; Depressed  Affect: Tearful; Congruent   Thought Process  Thought Processes: Coherent; Goal Directed  Descriptions of Associations:Intact  Orientation:Full (Time, Place and Person)  Thought Content:WDL  History of  Schizophrenia/Schizoaffective disorder:No data recorded Duration of Psychotic Symptoms:No data recorded Hallucinations:No data recorded Ideas of Reference:None  Suicidal Thoughts:No data recorded Homicidal Thoughts:No data recorded  Sensorium  Memory: Immediate Good; Remote Good; Recent Good  Judgment: Good  Insight: Fair   Art therapist  Concentration: Good  Attention Span: Good  Recall: Good  Fund of Knowledge: Good  Language: Good   Psychomotor Activity  Psychomotor Activity:No data recorded  Assets  Assets: Communication Skills; Desire for Improvement; Financial Resources/Insurance; Physical Health; Leisure Time   Sleep  Sleep:No data recorded   Physical Exam: Physical Exam ROS Blood pressure 122/87, pulse (!) 103, temperature 98.3 F (36.8 C), temperature source Oral, resp. rate 16, height 5\' 7"  (1.702 m), weight 105.7 kg, SpO2 100%. Body mass index is 36.49 kg/m.   COGNITIVE FEATURES THAT CONTRIBUTE TO RISK:  None    SUICIDE RISK:   Moderate:  Frequent suicidal ideation with limited intensity, and duration, some specificity in terms of plans, no associated intent, good self-control, limited dysphoria/symptomatology, some risk factors present, and identifiable protective factors, including available and accessible social support.  PLAN OF CARE: see H&P  I certify that inpatient services furnished can reasonably be expected to improve the patient's condition.   Trace Wirick Abbott Pao, MD 01/26/2023, 2:52 PM

## 2023-01-26 NOTE — Group Note (Signed)
   Type of Therapy and Topic: Group Therapy: Relationship Check-Ins   Participation Level: Did Not Attend   Description Group:  In this group patients are encouraged to rate how well or not well they are able to improve their relationships in Beliefs and Values, Communication, Family and Friends, Actuary and Household, and Intimacy. Patients will be able to discuss and identify what is going well within these aspects and what is not. Patients will be able to find appropriate ways, solutions, and skills that will help them within the selected relationships category. Patients will be encouraged to share and reflect on why things within their relationships are not going so well and get feedback from the instructor or their peers.  This group will be solution focused and process-oriented with patients' participation in sharing and listening to their own and peers experience; along with receive support and advice on how to improve or change the circumstance that is known to be challenging in their relationships category (Beliefs and Values, Communication, Family and Friends, Actuary and Household, and Intimacy).   Therapeutic Goals:  Patient will identify their strengths and weakness within their Beliefs and Values, Communication, Family and Friends, Actuary and Household, and Intimacy relationships. Patient will identify reasons why and how they can improve their relationships.  Patient will identify how they can be supportive and honest to themselves and in their relationships.  Patient will be able to gain support and give support to others with similar challenges.   Summary of Patient Progress    Therapeutic Modalities:  Solution Focused Therapy Cognitive Behavioral Therapy  Psychodynamic Therapy  Dialectical Behavior Therapy   Marinda Elk, LCSW 01/26/2023  1:22 PM

## 2023-01-26 NOTE — Progress Notes (Signed)
   01/26/23 1200  Psych Admission Type (Psych Patients Only)  Admission Status Voluntary  Psychosocial Assessment  Patient Complaints Worrying  Eye Contact Fair  Facial Expression Animated  Affect Depressed  Speech Logical/coherent  Interaction Assertive  Motor Activity Other (Comment)  Appearance/Hygiene Unremarkable  Behavior Characteristics Cooperative  Mood Depressed  Thought Process  Coherency WDL  Content Blaming others  Delusions None reported or observed  Perception WDL  Hallucination None reported or observed  Judgment Impaired  Confusion None  Danger to Self  Current suicidal ideation? Denies  Agreement Not to Harm Self Yes  Danger to Others  Danger to Others None reported or observed

## 2023-01-27 ENCOUNTER — Encounter (HOSPITAL_COMMUNITY): Payer: Self-pay

## 2023-01-27 DIAGNOSIS — F3113 Bipolar disorder, current episode manic without psychotic features, severe: Secondary | ICD-10-CM

## 2023-01-27 LAB — GLUCOSE, CAPILLARY
Glucose-Capillary: 132 mg/dL — ABNORMAL HIGH (ref 70–99)
Glucose-Capillary: 111 mg/dL — ABNORMAL HIGH (ref 70–99)
Glucose-Capillary: 166 mg/dL — ABNORMAL HIGH (ref 70–99)

## 2023-01-27 MED ORDER — INSULIN ASPART 100 UNIT/ML IJ SOLN
0.0000 [IU] | Freq: Three times a day (TID) | INTRAMUSCULAR | Status: DC
  Administered 2023-01-27 – 2023-01-30 (×5): 2 [IU] via SUBCUTANEOUS

## 2023-01-27 MED ORDER — NICOTINE POLACRILEX 2 MG MT GUM: 2.0000 mg | CHEWING_GUM | OROMUCOSAL | Status: DC | PRN

## 2023-01-27 MED ORDER — POLYETHYLENE GLYCOL 3350 17 G PO PACK
17.0000 g | PACK | Freq: Every day | ORAL | Status: DC | PRN
  Administered 2023-01-28: 17 g via ORAL
  Filled 2023-01-27: qty 1

## 2023-01-27 NOTE — Progress Notes (Signed)
Patient identified her goal for today as, "keeping open mind towards my recovery". Patient rated her depression as 0/10 and her anxiety level as 1/10, with 10 being the worst and 0 being none. Patient is medication compliant, no adverse reaction noted from prescribed medications. Patient observed interacting well with some peers in the milieu. Patient required insulin coverage x 1 on shift for blood glucose of 132 mg/dl. Patient denies SI/HI and AVH. Safety precautions maintained.  01/27/23 0810  Psych Admission Type (Psych Patients Only)  Admission Status Voluntary  Psychosocial Assessment  Patient Complaints Depression  Eye Contact Fair  Facial Expression Animated  Affect Depressed  Speech Logical/coherent  Interaction Assertive  Motor Activity Other (Comment) (WNL)  Appearance/Hygiene Unremarkable  Behavior Characteristics Cooperative  Mood Depressed;Pleasant  Thought Process  Coherency WDL  Content Blaming others  Delusions None reported or observed  Perception WDL  Hallucination None reported or observed  Judgment Impaired  Confusion None  Danger to Self  Current suicidal ideation? Denies  Agreement Not to Harm Self Yes  Description of Agreement Verbal  Danger to Others  Danger to Others None reported or observed

## 2023-01-27 NOTE — [facility] Group Notes (Signed)
[facility] Group Notes:  (Nursing/MHT/Case Management/Adjunct)  Date:  01/27/2023  Time:  10:52 PM  Type of Therapy:   AA Group  Participation Level:  Active  Participation Quality:  Appropriate  Affect:  Appropriate  Cognitive:  Appropriate  Insight:  Appropriate  Engagement in Group:  Supportive  Modes of Intervention:  Support  Summary of Progress/Problems:Pt attended AA meeting.   Kaitlyn Chambers 01/27/2023, 10:52 PM

## 2023-01-27 NOTE — Hospital Course (Signed)
Reason for admission: Suicide attempt - overdose on Tylenol PM Psychiatric diagnoses: Bipolar I Disorder with mixed features, PTSD Medical: UTI on Macrobid, Diabetes on Jardiance, HFpEF on Lasix, HTN on Irbesartan, Dyslipidemia on atorvastatin and fenofibrate Hospital course: Started on Abilify, trazodone. Continued home prazosin. Scheduled increase of Abilify to 10mg  on 7/27 To do over the weekend: Monitor for Abilify side effects. Last BM 5-6 days ago. Monitor for BM and escalate bowel regimen as necessary Dispo: likely early next week

## 2023-01-27 NOTE — Group Note (Signed)
Recreation Therapy Group Note   Group Topic:Team Building  Group Date: 01/27/2023 Start Time: 0930 End Time: 1000 Facilitators: Robbye Dede-McCall, LRT,CTRS Location: [address] Day[address]   Goal Area(s) Addresses:  Patient will effectively work with peer towards shared goal.  Patient will identify skills used to make activity successful.  Patient will identify how skills used during activity can be applied to reach post d/c goals.   Group Description: [employer]. In teams of 5-6, patients were given 11 craft pipe cleaners. Using the materials provided, patients were instructed to compete again the opposing team(s) to build the tallest free-standing structure from floor level. The activity was timed; difficulty increased by Clinical research associate as Production designer, theatre/television/film continued.  Systematically resources were removed with additional directions for example, placing one arm behind their back, working in silence, and shape stipulations. LRT facilitated post-activity discussion reviewing team processes and necessary communication skills involved in completion. Patients were encouraged to reflect how the skills utilized, or not utilized, in this activity can be incorporated to positively impact support systems post discharge.   Affect/Mood: Appropriate   Participation Level: Engaged   Participation Quality: Independent   Behavior: Appropriate   Speech/Thought Process: Focused   Insight: Good   Judgement: Good   Modes of Intervention: STEM Activity   Patient Response to Interventions:  Engaged   Education Outcome:  Acknowledges education   Clinical Observations/Individualized Feedback: Pt was bright and engaging. Pt was focused and shared in ideas with peers to the structure of their tower. Pt and peers completed their tower to the instructions given at the beginning.    Plan: Continue to engage patient in RT group sessions 2-3x/week.   Krysia Zahradnik-McCall, LRT,CTRS   01/27/2023 1:00 PM

## 2023-01-27 NOTE — Progress Notes (Signed)
[facility] MD Progress Note  01/27/2023 9:22 AM Kaitlyn Chambers  MRN:  [id number] Principal Problem: Bipolar 1 disorder, mixed, severe (HCC) Diagnosis: Principal Problem:   Bipolar 1 disorder, mixed, severe (HCC) Active Problems:   PTSD (post-traumatic stress disorder)   Tobacco use disorder   Subjective:   Kaitlyn Chambers is a 42 yr old female with a past psychiatric history of Bipolar disorder and PTSD, and a past medical history of HFpEF, COPD, HTN, hyperlipidemia, hypothyroidism, and T2DM who presented to the ED and was admitted for suicide attempt with intentional acetaminophen overdose.    Case was discussed in the multidisciplinary team. MAR was reviewed and patient has been compliant with medications.   PRN's in last 24 hours: none  Psychiatric Team made the following recommendations yesterday: -- Start Abilify 5mg  PO daily with plan to increase to 10mg  on 7/27             -- Restart prazosin 2 mg nightly for nightmares             -- Start trazodone 50 mg PO nightly             -- Start trazodone 50mg  PO nightly PRN -- Start hydroxyzine 25mg  PO q8h PRN  Today on interview, pt reports that her mood is "better". She says that she was upset yesterday evening because she tried to call her son and her husband hung up the phone. She said that she heard her son screaming in the background and just wanted to know if he was okay. She tried calling back and threatened to call the police if she was not able to make sure that her son was okay. Eventually, her husband let her talk to her son and she was relieved after this. She notes that she was proud of herself that she did not react more poorly than she did over the situation. She reports that her sister will visit her today. She denies SI, HI, thoughts of self-harm. She denies any side effects from medications. She denies dizziness with positional changes. She reports being constipated, with her most recent BM being 5-6 days ago. Discussed starting  bowel regimen and patient is agreeable. She notes that yesterday she received her Jardiance later in the day and it made her feel weak and that she felt better after receiving juice. She also prefers nicotine gum over using the nicotine patch.   Sleep: Fair Appetite:  Good Side effects from medications: does not endorse any side-effects they attribute to medications.  Total time spent with patient: 20 minutes  Past psychiatric history: Bipolar disorder, PTSD   Past Medical History:  Past Medical History:  Diagnosis Date   Anxiety    Bipolar 1 disorder, manic, full remission (HCC)    CHF (congestive heart failure) (HCC)    COPD (chronic obstructive pulmonary disease) (HCC)    Coronary artery disease    Depression    GERD (gastroesophageal reflux disease)    Gout    High cholesterol    Hypertension    Hypothyroidism    Metabolic syndrome    NSTEMI (non-ST elevated myocardial infarction) (HCC) 07/01/2016   On home oxygen therapy    "available; don't use it" (07/01/2016)   PCOS (polycystic ovarian syndrome)    Stomach ulcer    Type 2 diabetes, diet controlled (HCC)     Past Surgical History:  Procedure Laterality Date   CARDIAC CATHETERIZATION N/A 07/01/2016   Procedure: LEFT HEART CATH AND CORONARY ANGIOGRAPHY;  Surgeon: Kathleene Hazel, MD;  Location: [facility] INVASIVE CV LAB;  Service: Cardiovascular;  Laterality: N/A;   CARDIAC CATHETERIZATION N/A 07/01/2016   Procedure: Coronary Stent Intervention;  Surgeon: Kathleene Hazel, MD;  Location: [facility] INVASIVE CV LAB;  Service: Cardiovascular;  Laterality: N/A;   CARPAL TUNNEL RELEASE Bilateral    CERVICAL BIOPSY  W/ LOOP ELECTRODE EXCISION  05/2016   "precancerous cells"   CERVICAL BIOPSY  W/ LOOP ELECTRODE EXCISION     CORONARY ANGIOPLASTY WITH STENT PLACEMENT  07/01/2016   TONSILLECTOMY     Family History:  Family History  Problem Relation Age of Onset   Diabetes Mother    Heart failure Mother    Heart attack  Mother 84   Heart disease Mother    Alcohol abuse Mother    Arthritis Mother    COPD Mother    Depression Mother    Drug abuse Mother    Hyperlipidemia Mother    Hypertension Mother    Intellectual disability Mother    Healthy Son    Alcohol abuse Father    Arthritis Father    Depression Father    Intellectual disability Father    Early death Father    Alcohol abuse Sister    Arthritis Sister    Depression Sister    Intellectual disability Sister    Intellectual disability Maternal Grandmother    Hypertension Maternal Grandmother    Hyperlipidemia Maternal Grandmother    Heart disease Maternal Grandmother    Diabetes Maternal Grandmother    Depression Maternal Grandmother    COPD Maternal Grandmother    Alcohol abuse Maternal Grandfather    Arthritis Maternal Grandfather    COPD Maternal Grandfather    Diabetes Maternal Grandfather    Heart attack Maternal Grandfather    Heart disease Maternal Grandfather    Hyperlipidemia Maternal Grandfather    Hypertension Maternal Grandfather    Intellectual disability Maternal Grandfather    Arthritis Sister    Family Psychiatric History:  Mother - Bipolar disorder with suicide attempt Sister - depression, anxiety Multiple family members with alcohol use disorder No family history of completed suicide  Social History:  Social History   Substance and Sexual Activity  Alcohol Use No     Social History   Substance and Sexual Activity  Drug Use Not Currently   Types: Marijuana    Social History   Socioeconomic History   Marital status: Legally Separated    Spouse name: Not on file   Number of children: Not on file   Years of education: Not on file   Highest education level: Not on file  Occupational History   Not on file  Tobacco Use   Smoking status: Every Day    Current packs/day: 0.25    Average packs/day: 0.3 packs/day for 26.0 years (6.5 ttl pk-yrs)    Types: Cigarettes   Smokeless tobacco: Never  Vaping  Use   Vaping status: Never Used  Substance and Sexual Activity   Alcohol use: No   Drug use: Not Currently    Types: Marijuana   Sexual activity: Yes    Birth control/protection: None  Other Topics Concern   Not on file  Social History Narrative   Not on file   Social Determinants of Health   Financial Resource Strain: Not on file  Food Insecurity: Food Insecurity Present (01/25/2023)   Hunger Vital Sign    Worried About Running Out of Food in the Last Year: Often true  Ran Out of Food in the Last Year: Often true  Transportation Needs: Unmet Transportation Needs (01/25/2023)   PRAPARE - Administrator, Civil Service (Medical): Yes    Lack of Transportation (Non-Medical): Yes  Physical Activity: Not on file  Stress: Not on file  Social Connections: Not on file   Additional Social History:   Collateral obtained from: [employer] [phone]) 9:10am - 9:13am Spoke with pharmacy assistant who denied any record of patient receiving Klonopin from pharmacy. She reports that the patient did transfer medications to [facility] on [address]. She confirmed that patient did fill prescription for Xanax in 2022 at that location.  [address] [place], [place] [phone]) 9:16am - 9:24am Spoke with pharmacist who reports that patient has not received Klonopin from that pharmacy.  Current Medications: Current Facility-Administered Medications  Medication Dose Route Frequency Provider Last Rate Last Admin   alum & mag hydroxide-simeth (MAALOX/MYLANTA) 200-200-20 MG/5ML suspension 30 mL  30 mL Oral Q4H PRN Starkes-Perry, Juel Burrow, FNP       [START ON 01/28/2023] ARIPiprazole (ABILIFY) tablet 10 mg  10 mg Oral Daily Abbott Pao, Nadir, MD       aspirin EC tablet 81 mg  81 mg Oral Daily Attiah, Nadir, MD   81 mg at 01/27/23 0804   atorvastatin (LIPITOR) tablet 80 mg  80 mg Oral Daily Attiah, Nadir, MD   80 mg at 01/27/23 0804   diphenhydrAMINE (BENADRYL)  capsule 50 mg  50 mg Oral TID PRN Maryagnes Amos, FNP       Or   diphenhydrAMINE (BENADRYL) injection 50 mg  50 mg Intramuscular TID PRN Starkes-Perry, Juel Burrow, FNP       empagliflozin (JARDIANCE) tablet 25 mg  25 mg Oral Daily Attiah, Nadir, MD   25 mg at 01/27/23 0802   fenofibrate tablet 160 mg  160 mg Oral Daily Attiah, Nadir, MD   160 mg at 01/27/23 5366   furosemide (LASIX) tablet 40 mg  40 mg Oral Daily Attiah, Nadir, MD   40 mg at 01/27/23 0805   haloperidol (HALDOL) tablet 5 mg  5 mg Oral TID PRN Maryagnes Amos, FNP       Or   haloperidol lactate (HALDOL) injection 5 mg  5 mg Intramuscular TID PRN Starkes-Perry, Juel Burrow, FNP       hydrOXYzine (ATARAX) tablet 25 mg  25 mg Oral TID PRN Abbott Pao, Nadir, MD       insulin aspart (novoLOG) injection 0-15 Units  0-15 Units Subcutaneous TID WC Attiah, Nadir, MD       irbesartan (AVAPRO) tablet 150 mg  150 mg Oral Daily Attiah, Nadir, MD   150 mg at 01/27/23 0801   LORazepam (ATIVAN) tablet 2 mg  2 mg Oral TID PRN Maryagnes Amos, FNP       Or   LORazepam (ATIVAN) injection 2 mg  2 mg Intramuscular TID PRN Starkes-Perry, Juel Burrow, FNP       magnesium hydroxide (MILK OF MAGNESIA) suspension 30 mL  30 mL Oral Daily PRN Starkes-Perry, Juel Burrow, FNP       nicotine (NICODERM CQ - dosed in mg/24 hours) patch 14 mg  14 mg Transdermal Daily Massengill, Harrold Donath, MD   14 mg at 01/26/23 4403   nicotine polacrilex (NICORETTE) gum 2 mg  2 mg Oral PRN Abbott Pao, Nadir, MD       nitrofurantoin (macrocrystal-monohydrate) (MACROBID) capsule 100 mg  100 mg Oral Q12H Sarita Bottom, MD  100 mg at 01/27/23 0801   polyethylene glycol (MIRALAX / GLYCOLAX) packet 17 g  17 g Oral Daily PRN Abbott Pao, Nadir, MD       prazosin (MINIPRESS) capsule 2 mg  2 mg Oral QHS Attiah, Nadir, MD   2 mg at 01/26/23 2122   traZODone (DESYREL) tablet 50 mg  50 mg Oral QHS PRN Sindy Guadeloupe, NP   50 mg at 01/25/23 2230   traZODone (DESYREL) tablet 50 mg  50 mg Oral QHS  Abbott Pao, Nadir, MD   50 mg at 01/26/23 2122    Lab Results:  Results for orders placed or performed during the hospital encounter of 01/25/23 (from the past 48 hour(s))  Glucose, capillary     Status: Abnormal   Collection Time: 01/26/23  8:20 AM  Result Value Ref Range   Glucose-Capillary 170 (H) 70 - 99 mg/dL    Comment: Glucose reference range applies only to samples taken after fasting for at least 8 hours.  Glucose, capillary     Status: Abnormal   Collection Time: 01/26/23 12:04 PM  Result Value Ref Range   Glucose-Capillary 100 (H) 70 - 99 mg/dL    Comment: Glucose reference range applies only to samples taken after fasting for at least 8 hours.   Comment 1 Notify RN   Lipid panel     Status: Abnormal   Collection Time: 01/27/23  6:12 AM  Result Value Ref Range   Cholesterol 232 (H) 0 - 200 mg/dL   Triglycerides 409 (H) <150 mg/dL   HDL 32 (L) >81 mg/dL   Total CHOL/HDL Ratio 7.3 RATIO   VLDL 42 (H) 0 - 40 mg/dL   LDL Cholesterol 191 (H) 0 - 99 mg/dL    Comment:        Total Cholesterol/HDL:CHD Risk Coronary Heart Disease Risk Table                     Men   Women  1/2 Average Risk   3.4   3.3  Average Risk       5.0   4.4  2 X Average Risk   9.6   7.1  3 X Average Risk  23.4   11.0        Use the calculated Patient Ratio above and the CHD Risk Table to determine the patient's CHD Risk.        ATP III CLASSIFICATION (LDL):  <100     mg/dL   Optimal  478-295  mg/dL   Near or Above                    Optimal  130-159  mg/dL   Borderline  621-308  mg/dL   High  >657     mg/dL   Very High Performed at [facility], [address] [place], [place] [address]     Blood Alcohol level:  Lab Results  Component Value Date   [facility] <10 01/23/2023   ETH <11 12/07/2011    Metabolic Disorder Labs: Lab Results  Component Value Date   HGBA1C 6.0 (H) 01/24/2023   MPG 125.5 01/24/2023   MPG 146 07/01/2016   No results found for: "PROLACTIN" Lab  Results  Component Value Date   CHOL 232 (H) 01/27/2023   TRIG 212 (H) 01/27/2023   HDL 32 (L) 01/27/2023   CHOLHDL 7.3 01/27/2023   VLDL 42 (H) 01/27/2023   LDLCALC 158 (H) 01/27/2023   LDLCALC UNABLE  TO CALCULATE IF TRIGLYCERIDE OVER 400 mg/dL 84/69/6295    Physical Findings: AIMS: Facial and Oral Movements Muscles of Facial Expression: None, normal Lips and Perioral Area: None, normal Jaw: None, normal Tongue: None, normal,Extremity Movements Upper (arms, wrists, hands, fingers): None, normal Lower (legs, knees, ankles, toes): None, normal, Trunk Movements Neck, shoulders, hips: None, normal, Overall Severity Severity of abnormal movements (highest score from questions above): None, normal Incapacitation due to abnormal movements: None, normal Patient's awareness of abnormal movements (rate only patient's report): No Awareness, Dental Status Current problems with teeth and/or dentures?: No Does patient usually wear dentures?: No  CIWA:    COWS:     Musculoskeletal: Strength & Muscle Tone: within normal limits Gait & Station: normal Patient leans: N/A  Psychiatric Specialty Exam:  Presentation  General Appearance:   Appropriate for Environment; Casual   Eye Contact:  Good  Speech:  Clear and Coherent; Normal Rate  Speech Volume:  Normal  Handedness:  -- (not assessed)    Mood and Affect  Mood:  Euthymic  Affect:  Appropriate; Congruent; Full Range   Thought Process  Thought Processes:  Coherent; Goal Directed; Linear  Descriptions of Associations: Intact  Orientation: Full (Time, Place and Person)  Thought Content: Logical; WDL  History of Schizophrenia/Schizoaffective disorder: No data recorded Duration of Psychotic Symptoms: No data recorded Hallucinations: Hallucinations: None  Ideas of Reference: None  Suicidal Thoughts: Suicidal Thoughts: No  Homicidal Thoughts: Homicidal Thoughts: No    Sensorium  Memory: Immediate Good;  Recent Good; Remote Good   Judgment:  Fair   Insight:  Fair    Art therapist  Concentration:  Good  Attention Span:  Good  Recall:  Good  Fund of Knowledge:  Good  Language:  Good  Psychomotor Activity  Psychomotor Activity:  Psychomotor Activity: Normal   Physical Exam: Physical Exam Vitals and nursing note reviewed.  Constitutional:      General: She is not in acute distress.    Appearance: She is not toxic-appearing.  HENT:     Head: Normocephalic.  Pulmonary:     Effort: Pulmonary effort is normal. No respiratory distress.  Neurological:     General: No focal deficit present.     Mental Status: She is alert.     Review of Systems  Gastrointestinal:  Positive for constipation. Negative for abdominal pain, diarrhea, nausea and vomiting.  Neurological:  Negative for dizziness and focal weakness.  All other systems reviewed and are negative.   Blood pressure 101/70, pulse 98, temperature 98.2 F (36.8 C), temperature source Oral, resp. rate 20, height 5\' 7"  (1.702 m), weight 105.7 kg, SpO2 100%. Body mass index is 36.49 kg/m.  ASSESSMENT:  Diagnoses / Active Problems: Bipolar 1 disorder, mixed, severe (HCC) PTSD Tobacco Use Disorder  Her symptoms of depressed mood, feelings of guilt, decreased concentration, suicidal ideation with overdose, lack of sleep without decreased energy are most consistent with a diagnosis of Bipolar I disorder with mixed features. She has previously been hospitalized for mania and has a prior diagnosis of Bipolar disorder, making this episode most consistent with a mixed episode. She also has not taken any of her psychiatric medications for the past 2 months. Her social stressors including issues with her husband, caretaker responsibilities with her children, upcoming eviction, and dangerous living situation are contributing to the current episode. She also endorses flashbacks, hypervigilance, avoidance of thoughts  about trauma, recurrent night terrors, and exposure to threatened death and sexual violence, most consistent with a diagnosis  of PTSD. She also meets criteria for Tobacco Use Disorder.     7/26: Currently holding Norvasc while monitoring blood pressure, as patient is also on irbesartan. Patient also has a diagnosed symptomatic UTI and received IV ceftriaxone while in the ED. Macrobid 100mg  BID for 7 days per ED recommendation started on 7/25, to end on 7/31. Abilify 5mg  started on 7/25 with plan to increase to 10mg  on 7/27. Patient has tolerated all medications well and denies any side effects. Patient reports constipation with last BM 5-6 days ago. Will start bowel regimen starting with Miralax, but will monitor and adjust as necessary.  PLAN: Safety and Monitoring:  -- Voluntary admission to inpatient psychiatric unit for safety, stabilization and treatment  -- Daily contact with patient to assess and evaluate symptoms and progress in treatment  -- Patient's case to be discussed in multi-disciplinary team meeting  -- Observation Level : q15 minute checks  -- Vital signs:  q12 hours  -- Precautions: suicide, elopement, and assault  2. Psychiatric Diagnoses and Treatment:  -- Continue Abilify 5mg  PO daily with plan to increase to 10mg  on 7/27             -- Continue prazosin 2 mg nightly for nightmares             -- Continue trazodone 50 mg PO nightly             -- Continue trazodone 50mg  PO nightly PRN -- Continue hydroxyzine 25mg  PO q8h PRN  --  The risks/benefits/side-effects/alternatives to this medication were discussed in detail with the patient and time was given for questions. The patient consents to medication trial.   -- Metabolic profile and EKG monitoring obtained while on an atypical antipsychotic (Body mass index is 36.49 kg/m., Lipid Panel: see above; HbgA1c: 6.0,QTc: 485 on 01/25/2023)   -- Encouraged patient to participate in unit milieu and in scheduled group therapies   --  Short Term Goals: Ability to identify changes in lifestyle to reduce recurrence of condition will improve, Ability to verbalize feelings will improve, Ability to disclose and discuss suicidal ideas, Ability to identify and develop effective coping behaviors will improve, Compliance with prescribed medications will improve, and Ability to identify triggers associated with substance abuse/mental health issues will improve  -- Long Term Goals: Improvement in symptoms so as ready for discharge    3. Medical Issues Being Addressed:   # UTI             -- Continue Macrobid 100mg  PO BID for 7 days (s7/25, to end on 7/31)               # Diabetes             -- Continue empagliflozin 25mg  PO daily             -- Glucose checks achs               #HFpEF             -- Continue home furosemide 40mg  PO daily                          # History of CAD             -- Continue aspirin 81mg  PO daily               # HTN               --  Continue irbesartan 150mg  PO daily -- HOLD home amlodipine 5mg  PO daily at this time, monitor blood pressure and consider restarting amlodipine if needed.               # Dyslipidemia             -- Continue home atorvastatin 80mg  PO daily             -- Continue home fenofibrate 160mg  PO daily   # Constipation  -- Start Miralax 17g daily PRN  -- Monitor bowel function   Labs reviewed   Tobacco Use Disorder  --  Patient in need of nicotine replacement; nicotine patch 14 mg / 24 hours ordered. Smoking cessation encouraged  -- Start nicotine gum 2mg  PRN  -- Smoking cessation encouraged  4. Discharge Planning:   -- Social work and case management to assist with discharge planning and identification of hospital follow-up needs prior to discharge  -- Estimated LOS: 5-7 days  -- Discharge Concerns: Need to establish a safety plan; Medication compliance and effectiveness  -- Discharge Goals: Return home with outpatient referrals for mental health follow-up including  medication management/psychotherapy   Gwyndolyn Kaufman, MS4 01/27/2023, 9:22 AM

## 2023-01-27 NOTE — Plan of Care (Signed)
  Problem: Activity: Goal: Interest or engagement in activities will improve Outcome: Progressing Goal: Sleeping patterns will improve Outcome: Progressing   Problem: Coping: Goal: Ability to verbalize frustrations and anger appropriately will improve Outcome: Progressing   Problem: Safety: Goal: Periods of time without injury will increase Outcome: Progressing

## 2023-01-27 NOTE — Progress Notes (Signed)
   01/26/23 2122  Psych Admission Type (Psych Patients Only)  Admission Status Voluntary  Psychosocial Assessment  Patient Complaints Anger;Depression;Worrying  Eye Contact Fair  Facial Expression Animated  Affect Depressed  Speech Logical/coherent  Interaction Assertive  Motor Activity Other (Comment) (WDL)  Appearance/Hygiene Unremarkable  Behavior Characteristics Cooperative  Mood Depressed;Pleasant  Thought Process  Coherency WDL  Content Blaming others  Delusions None reported or observed  Perception WDL  Hallucination None reported or observed  Judgment Impaired  Confusion None  Danger to Self  Current suicidal ideation? Denies  Agreement Not to Harm Self Yes  Description of Agreement verbal  Danger to Others  Danger to Others None reported or observed   Pt was offered support and encouragement. Pt was given scheduled medications. Q 15 minute checks were done for safety. Pt attended group and interacts well with peers and staff. Pt has no complaints.Pt receptive to treatment and safety maintained on unit.

## 2023-01-27 NOTE — BH IP Treatment Plan (Signed)
Interdisciplinary Treatment and Diagnostic Plan Update  01/27/2023 Time of Session: 11:00am Kaitlyn Chambers MRN: [id number]  Principal Diagnosis: Bipolar 1 disorder, mixed, severe (HCC)  Secondary Diagnoses: Principal Problem:   Bipolar 1 disorder, mixed, severe (HCC) Active Problems:   PTSD (post-traumatic stress disorder)   Tobacco use disorder   Current Medications:  Current Facility-Administered Medications  Medication Dose Route Frequency Provider Last Rate Last Admin   alum & mag hydroxide-simeth (MAALOX/MYLANTA) 200-200-20 MG/5ML suspension 30 mL  30 mL Oral Q4H PRN Starkes-Perry, Juel Burrow, FNP       [START ON 01/28/2023] ARIPiprazole (ABILIFY) tablet 10 mg  10 mg Oral Daily Abbott Pao, Nadir, MD       aspirin EC tablet 81 mg  81 mg Oral Daily Attiah, Nadir, MD   81 mg at 01/27/23 0804   atorvastatin (LIPITOR) tablet 80 mg  80 mg Oral Daily Attiah, Nadir, MD   80 mg at 01/27/23 0804   diphenhydrAMINE (BENADRYL) capsule 50 mg  50 mg Oral TID PRN Maryagnes Amos, FNP       Or   diphenhydrAMINE (BENADRYL) injection 50 mg  50 mg Intramuscular TID PRN Starkes-Perry, Juel Burrow, FNP       empagliflozin (JARDIANCE) tablet 25 mg  25 mg Oral Daily Attiah, Nadir, MD   25 mg at 01/27/23 0802   fenofibrate tablet 160 mg  160 mg Oral Daily Attiah, Nadir, MD   160 mg at 01/27/23 0803   furosemide (LASIX) tablet 40 mg  40 mg Oral Daily Attiah, Nadir, MD   40 mg at 01/27/23 0805   haloperidol (HALDOL) tablet 5 mg  5 mg Oral TID PRN Maryagnes Amos, FNP       Or   haloperidol lactate (HALDOL) injection 5 mg  5 mg Intramuscular TID PRN Starkes-Perry, Juel Burrow, FNP       hydrOXYzine (ATARAX) tablet 25 mg  25 mg Oral TID PRN Abbott Pao, Nadir, MD       insulin aspart (novoLOG) injection 0-15 Units  0-15 Units Subcutaneous TID WC Attiah, Nadir, MD       irbesartan (AVAPRO) tablet 150 mg  150 mg Oral Daily Attiah, Nadir, MD   150 mg at 01/27/23 0801   LORazepam (ATIVAN) tablet 2 mg  2 mg Oral TID  PRN Maryagnes Amos, FNP       Or   LORazepam (ATIVAN) injection 2 mg  2 mg Intramuscular TID PRN Starkes-Perry, Juel Burrow, FNP       magnesium hydroxide (MILK OF MAGNESIA) suspension 30 mL  30 mL Oral Daily PRN Starkes-Perry, Juel Burrow, FNP       nicotine (NICODERM CQ - dosed in mg/24 hours) patch 14 mg  14 mg Transdermal Daily Massengill, Nathan, MD   14 mg at 01/26/23 4696   nicotine polacrilex (NICORETTE) gum 2 mg  2 mg Oral PRN Abbott Pao, Nadir, MD       nitrofurantoin (macrocrystal-monohydrate) (MACROBID) capsule 100 mg  100 mg Oral Q12H Attiah, Nadir, MD   100 mg at 01/27/23 0801   polyethylene glycol (MIRALAX / GLYCOLAX) packet 17 g  17 g Oral Daily PRN Abbott Pao, Nadir, MD       prazosin (MINIPRESS) capsule 2 mg  2 mg Oral QHS Attiah, Nadir, MD   2 mg at 01/26/23 2122   traZODone (DESYREL) tablet 50 mg  50 mg Oral QHS PRN Sindy Guadeloupe, NP   50 mg at 01/25/23 2230   traZODone (DESYREL) tablet 50 mg  50 mg  Oral QHS Sarita Bottom, MD   50 mg at 01/26/23 2122   PTA Medications: Medications Prior to Admission  Medication Sig Dispense Refill Last Dose   amLODipine (NORVASC) 5 MG tablet Take 1 tablet (5 mg total) by mouth daily. (Patient not taking: Reported on 01/24/2023) 30 tablet 0    aspirin EC 81 MG EC tablet Take 1 tablet (81 mg total) by mouth daily. (Patient not taking: Reported on 01/24/2023) 30 tablet 0    atorvastatin (LIPITOR) 80 MG tablet Take 80 mg by mouth daily. (Patient not taking: Reported on 01/24/2023)      Blood Glucose Monitoring Suppl (ONE TOUCH ULTRA 2) w/Device KIT 1 kit by Does not apply route daily. 1 kit 0    empagliflozin (JARDIANCE) 25 MG TABS tablet Take 25 mg by mouth daily. (Patient not taking: Reported on 01/24/2023)      fenofibrate (TRICOR) 145 MG tablet Take 1 tablet (145 mg total) by mouth daily. (Patient not taking: Reported on 01/24/2023) 90 tablet 3    FLUoxetine (PROZAC) 40 MG capsule Take 1 capsule (40 mg total) by mouth daily. (Patient not taking: Reported  on 01/24/2023) 30 capsule 1    furosemide (LASIX) 40 MG tablet Take 1 tablet (40 mg total) by mouth as needed. (Patient not taking: Reported on 01/24/2023) 30 tablet 0    glucose blood (ONETOUCH ULTRA) test strip Check blood sugars once daily  before breakfast (fasting) Dx: E11.9 100 each 12    irbesartan (AVAPRO) 150 MG tablet Take 150 mg by mouth daily. (Patient not taking: Reported on 01/24/2023)      Lancets (FREESTYLE) lancets Use as instructed 100 each 12    nitrofurantoin, macrocrystal-monohydrate, (MACROBID) 100 MG capsule Take 1 capsule (100 mg total) by mouth 2 (two) times daily for 5 days. 10 capsule 0    praziquantel (BILTRICIDE) 600 MG tablet 5.5 tabkets now and repeat in 10 days (Patient not taking: Reported on 01/24/2023) 11 tablet 0    prazosin (MINIPRESS) 1 MG capsule Take 2 mg by mouth at bedtime. (Patient not taking: Reported on 01/24/2023)       Patient Stressors: Health problems   Marital or family conflict    Patient Strengths: Ability for insight   Treatment Modalities: Medication Management, Group therapy, Case management,  1 to 1 session with clinician, Psychoeducation, Recreational therapy.   Physician Treatment Plan for Primary Diagnosis: Bipolar 1 disorder, mixed, severe (HCC) Long Term Goal(s): Improvement in symptoms so as ready for discharge   Short Term Goals: Ability to identify changes in lifestyle to reduce recurrence of condition will improve Ability to verbalize feelings will improve Ability to disclose and discuss suicidal ideas Ability to identify and develop effective coping behaviors will improve Compliance with prescribed medications will improve Ability to identify triggers associated with substance abuse/mental health issues will improve  Medication Management: Evaluate patient's response, side effects, and tolerance of medication regimen.  Therapeutic Interventions: 1 to 1 sessions, Unit Group sessions and Medication administration.  Evaluation  of Outcomes: Progressing  Physician Treatment Plan for Secondary Diagnosis: Principal Problem:   Bipolar 1 disorder, mixed, severe (HCC) Active Problems:   PTSD (post-traumatic stress disorder)   Tobacco use disorder  Long Term Goal(s): Improvement in symptoms so as ready for discharge   Short Term Goals: Ability to identify changes in lifestyle to reduce recurrence of condition will improve Ability to verbalize feelings will improve Ability to disclose and discuss suicidal ideas Ability to identify and develop effective coping behaviors will  improve Compliance with prescribed medications will improve Ability to identify triggers associated with substance abuse/mental health issues will improve     Medication Management: Evaluate patient's response, side effects, and tolerance of medication regimen.  Therapeutic Interventions: 1 to 1 sessions, Unit Group sessions and Medication administration.  Evaluation of Outcomes: Progressing   RN Treatment Plan for Primary Diagnosis: Bipolar 1 disorder, mixed, severe (HCC) Long Term Goal(s): Knowledge of disease and therapeutic regimen to maintain health will improve  Short Term Goals: Ability to remain free from injury will improve, Ability to verbalize frustration and anger appropriately will improve, Ability to demonstrate self-control, Ability to participate in decision making will improve, Ability to verbalize feelings will improve, Ability to disclose and discuss suicidal ideas, Ability to identify and develop effective coping behaviors will improve, and Compliance with prescribed medications will improve  Medication Management: RN will administer medications as ordered by provider, will assess and evaluate patient's response and provide education to patient for prescribed medication. RN will report any adverse and/or side effects to prescribing provider.  Therapeutic Interventions: 1 on 1 counseling sessions, Psychoeducation, Medication  administration, Evaluate responses to treatment, Monitor vital signs and CBGs as ordered, Perform/monitor CIWA, COWS, AIMS and Fall Risk screenings as ordered, Perform wound care treatments as ordered.  Evaluation of Outcomes: Progressing   LCSW Treatment Plan for Primary Diagnosis: Bipolar 1 disorder, mixed, severe (HCC) Long Term Goal(s): Safe transition to appropriate next level of care at discharge, Engage patient in therapeutic group addressing interpersonal concerns.  Short Term Goals: Engage patient in aftercare planning with referrals and resources, Increase social support, Increase ability to appropriately verbalize feelings, Increase emotional regulation, Facilitate acceptance of mental health diagnosis and concerns, Facilitate patient progression through stages of change regarding substance use diagnoses and concerns, Identify triggers associated with mental health/substance abuse issues, and Increase skills for wellness and recovery  Therapeutic Interventions: Assess for all discharge needs, 1 to 1 time with Social worker, Explore available resources and support systems, Assess for adequacy in community support network, Educate family and significant other(s) on suicide prevention, Complete Psychosocial Assessment, Interpersonal group therapy.  Evaluation of Outcomes: Progressing   Progress in Treatment: Attending groups: Yes. Participating in groups: Yes. Taking medication as prescribed: Yes. Toleration medication: Yes. Family/Significant other contact made: No, will contact:  Will contact once consent is granted Patient understands diagnosis: Yes. Discussing patient identified problems/goals with staff: Yes. Medical problems stabilized or resolved: Yes. Denies suicidal/homicidal ideation: Yes. Issues/concerns per patient self-inventory: No. Other: none reported  New problem(s) identified: No, Describe:  none reported  New Short Term/Long Term Goal(s):medication  stabilization, elimination of SI thoughts, development of comprehensive mental wellness plan.     Patient Goals:  "to feel better about myself"  Discharge Plan or Barriers: Patient recently admitted. CSW will continue to follow and assess for appropriate referrals and possible discharge planning.    Reason for Continuation of Hospitalization: Depression Suicidal ideation  Estimated Length of Stay:3-7 days  Last 3 [place] Suicide Severity Risk Score: Flowsheet Row Admission (Current) [facility]osp-Admission (Discharged) from 01/23/2023 in [facility] from 09/24/2022 in [facility])  C-SSRS RISK CATEGORY No Risk High Risk No Risk       Last PHQ 2/9 Scores:     No data to display          Scribe for Treatment Team: Starleen Arms, [place] 01/27/2023 11:18 AM

## 2023-01-27 NOTE — [facility] Suicide Risk Assessment (Signed)
[facility] INPATIENT:  Family/Significant Other Suicide Prevention Education  Suicide Prevention Education:  Education Completed; 01-27-2023, Kaitlyn Chambers [phone] (Sister) has been identified by the patient as the family member/significant other with whom the patient will be residing, and identified as the person(s) who will aid the patient in the event of a mental health crisis (suicidal ideations/suicide attempt).  With written consent from the patient, the family member/significant other has been provided the following suicide prevention education, prior to the and/or following the discharge of the patient.  The suicide prevention education provided includes the following: Suicide risk factors Suicide prevention and interventions National Suicide Hotline telephone number [facility] assessment telephone number [facility] Emergency Assistance 911 [facility] and/or Residential Mobile Crisis Unit telephone number  Request made of family/significant other to: Remove weapons (e.g., guns, rifles, knives), all items previously/currently identified as safety concern.   Remove drugs/medications (over-the-counter, prescriptions, illicit drugs), all items previously/currently identified as a safety concern.  Kaitlyn Chambers [phone] (Sister)  verbalizes understanding of the suicide prevention education information provided.  The family member/significant other agrees to remove the items of safety concern listed above.  Kaitlyn Chambers 01/27/2023, 1:55 PM

## 2023-01-27 NOTE — Progress Notes (Signed)
   01/27/23 0602  15 Minute Checks  Location Bedroom  Visual Appearance Calm  Behavior Sleeping  Sleep (Behavioral Health Patients Only)  Calculate sleep? (Click Yes once per 24 hr at 0600 safety check) Yes  Documented sleep last 24 hours 7.75

## 2023-01-27 NOTE — [facility] Group Notes (Signed)
Adult Psychoeducational Group Note  Date:  01/27/2023 Time:  10:18 AM  Group Topic/Focus:  Goals Group:   The focus of this group is to help patients establish daily goals to achieve during treatment and discuss how the patient can incorporate goal setting into their daily lives to aide in recovery. Orientation:   The focus of this group is to educate the patient on the purpose and policies of crisis stabilization and provide a format to answer questions about their admission.  The group details unit policies and expectations of patients while admitted.  Participation Level:  Active  Participation Quality:  Appropriate  Affect:  Appropriate  Cognitive:  Appropriate  Insight: Appropriate  Engagement in Group:  Engaged  Modes of Intervention:  Discussion  Additional Comments:  Pt attended the goals group and remained appropriate and engaged throughout the duration of the group.   Sheran Lawless 01/27/2023, 10:18 AM

## 2023-01-27 NOTE — Plan of Care (Signed)
  Problem: Education: Goal: Knowledge of [place] [facility] Education information/materials will improve Outcome: Progressing Goal: Emotional status will improve Outcome: Progressing Goal: Mental status will improve Outcome: Progressing Goal: Verbalization of understanding the information provided will improve Outcome: Progressing   Problem: Activity: Goal: Interest or engagement in activities will improve Outcome: Progressing Goal: Sleeping patterns will improve Outcome: Progressing   Problem: Coping: Goal: Ability to verbalize frustrations and anger appropriately will improve Outcome: Progressing Goal: Ability to demonstrate self-control will improve Outcome: Progressing   Problem: Health Behavior/Discharge Planning: Goal: Identification of resources available to assist in meeting health care needs will improve Outcome: Progressing Goal: Compliance with treatment plan for underlying cause of condition will improve Outcome: Progressing   Problem: Physical Regulation: Goal: Ability to maintain clinical measurements within normal limits will improve Outcome: Progressing   Problem: Safety: Goal: Periods of time without injury will increase Outcome: Progressing   Problem: Education: Goal: Ability to describe self-care measures that may prevent or decrease complications (Diabetes Survival Skills Education) will improve Outcome: Progressing Goal: Individualized Educational Video(s) Outcome: Progressing   Problem: Coping: Goal: Ability to adjust to condition or change in health will improve Outcome: Progressing   Problem: Fluid Volume: Goal: Ability to maintain a balanced intake and output will improve Outcome: Progressing   Problem: Health Behavior/Discharge Planning: Goal: Ability to identify and utilize available resources and services will improve Outcome: Progressing Goal: Ability to manage health-related needs will improve Outcome: Progressing   Problem:  Metabolic: Goal: Ability to maintain appropriate glucose levels will improve Outcome: Progressing   Problem: Nutritional: Goal: Maintenance of adequate nutrition will improve Outcome: Progressing Goal: Progress toward achieving an optimal weight will improve Outcome: Progressing   Problem: Skin Integrity: Goal: Risk for impaired skin integrity will decrease Outcome: Progressing   Problem: Tissue Perfusion: Goal: Adequacy of tissue perfusion will improve Outcome: Progressing

## 2023-01-27 NOTE — Progress Notes (Signed)
   01/27/23 2316  Psych Admission Type (Psych Patients Only)  Admission Status Voluntary  Psychosocial Assessment  Patient Complaints Anger;Depression;Worrying  Eye Contact Fair  Facial Expression Animated  Affect Depressed  Speech Logical/coherent  Interaction Assertive  Motor Activity Other (Comment) (WNL)  Appearance/Hygiene Unremarkable  Behavior Characteristics Cooperative  Mood Depressed;Pleasant  Thought Process  Coherency WDL  Content Blaming others  Delusions None reported or observed  Perception WDL  Hallucination None reported or observed  Judgment Impaired  Confusion None  Danger to Self  Current suicidal ideation? Denies  Agreement Not to Harm Self Yes  Description of Agreement verbal  Danger to Others  Danger to Others None reported or observed

## 2023-01-28 LAB — GLUCOSE, CAPILLARY
Glucose-Capillary: 140 mg/dL — ABNORMAL HIGH (ref 70–99)
Glucose-Capillary: 120 mg/dL — ABNORMAL HIGH (ref 70–99)
Glucose-Capillary: 93 mg/dL (ref 70–99)
Glucose-Capillary: 166 mg/dL — ABNORMAL HIGH (ref 70–99)

## 2023-01-28 MED ORDER — LACTULOSE 10 GM/15ML PO SOLN
20.0000 g | Freq: Once | ORAL | Status: AC
  Administered 2023-01-28: 20 g via ORAL
  Filled 2023-01-28: qty 30

## 2023-01-28 MED ORDER — FLUCONAZOLE 150 MG PO TABS
150.0000 mg | ORAL_TABLET | Freq: Once | ORAL | Status: AC
  Administered 2023-01-28: 150 mg via ORAL
  Filled 2023-01-28: qty 1

## 2023-01-28 NOTE — Progress Notes (Signed)
Pt remains alert and oriented. She has been visible in milieu and participating in groups. Maintains appropriate boundaries with peers and staff.  Q60min checks remain in place for safety.

## 2023-01-28 NOTE — Progress Notes (Signed)
Pt is awake and alert x3.  Flat affect and congruent mood that brightens up with engagement.  Pt Denies SI, HI or AVH at time of assessment.  Reports that she did have an intentional overdose and " did something stupid".  Pt complaining of generalized malaise that she attributes to her menstrual cycle.  Pt took medication without difficulty or prompting.  Given PRN medication for constipation.  No other complaints noted.  Q checks in place for safety.

## 2023-01-28 NOTE — Plan of Care (Signed)
  Problem: Education: Goal: Knowledge of [place] [facility] Education information/materials will improve Outcome: Progressing   Problem: Activity: Goal: Interest or engagement in activities will improve Outcome: Progressing   Problem: Physical Regulation: Goal: Ability to maintain clinical measurements within normal limits will improve Outcome: Progressing
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
## 2023-01-28 NOTE — [facility] Group Notes (Signed)
[facility] Group Notes:  (Nursing/MHT/Case Management/Adjunct)  Date:  01/28/2023  Time:  2:13 PM  Type of Therapy:  Psychoeducational Skills  Participation Level:  Active  Participation Quality:  Appropriate  Affect:  Appropriate  Cognitive:  Alert and Appropriate  Insight:  Appropriate  Engagement in Group:  Engaged  Modes of Intervention:  Discussion, Education, and Exploration  Summary of Progress/Problems: Pts were given education on anxiety, and a podcast with mental health professional was played which discussed identifying negative behavioral patterns and anxiety. Pt attended and was appropriate.   Malva Limes 01/28/2023, 2:13 PM

## 2023-01-28 NOTE — [facility] Group Notes (Signed)
[facility] Group Notes:  (Nursing/MHT/Case Management/Adjunct)  Date:  01/28/2023  Time:  9:36 PM  Type of Therapy:   Wrap-up group  Participation Level:  Active  Participation Quality:  Appropriate  Affect:  Appropriate  Cognitive:  Appropriate  Insight:  Appropriate  Engagement in Group:  Engaged  Modes of Intervention:  Education  Summary of Progress/Problems: Pt goal to relax. Day 7/10.  Kaitlyn Chambers 01/28/2023, 9:36 PM

## 2023-01-28 NOTE — [facility] Group Notes (Signed)
[facility] Group Notes:  (Nursing/MHT/Case Management/Adjunct)  Date:  01/28/2023  Time:  10:23 AM  Type of Therapy:  Psychoeducational Skills  Participation Level:  Active  Participation Quality:  Appropriate  Affect:  Appropriate  Cognitive:  Appropriate  Insight:  Appropriate  Engagement in Group:  Engaged  Modes of Intervention:  Discussion, Education, and Exploration  Summary of Progress/Problems: Daily Goals group and Community meeting held. Discussed with patients unit flow and ward rules. Discussed daily check in to rate how pts were feeling. Pt shared and participated.   Kaitlyn Chambers 01/28/2023, 10:23 AM

## 2023-01-28 NOTE — Plan of Care (Signed)

## 2023-01-28 NOTE — Progress Notes (Signed)
[facility] MD Progress Note  01/28/2023 9:36 AM Kaitlyn Chambers  MRN:  [id number]   Reason for Admission:  Kaitlyn Chambers is a 42 yr old female with a past psychiatric history of Bipolar disorder and PTSD, and a past medical history of HFpEF, COPD, HTN, hyperlipidemia, hypothyroidism, and T2DM who presented to the ED and was admitted for suicide attempt with intentional acetaminophen overdose. The patient is currently on Hospital Day 3.   Chart Review from last 24 hours:  The patient's chart was reviewed and nursing notes were reviewed. The patient's case was discussed in multidisciplinary team meeting. Per [facility] patient is compliant with prescribed medications as needed trazodone 50 mg was used only once on 7/24 otherwise patient was reported to have fairly good sleep with the scheduled Minipress and trazodone.  Information Obtained Today During Patient Interview: The patient was seen and evaluated on the unit. On assessment today the patient reports sister visited last night and visit went to great, she also reports multiple phone contact with sister and friends "I feel I have good support" she reports good sleep and fair appetite she does report some decreased energy today probably related to her menstrual cycle per her report.  Patient reports whitish vaginal discharge as well as vaginal itching since started treatment for UTI which she reports used to happen in the past when she is being treated with antibiotics, discussed with patient using Diflucan one-time dosing.  She also continues to report constipation despite MiraLAX yesterday, will provide 1 order of lactulose and monitor efficacy.  She reports improved mood since admission denies any symptoms consistent with mania or hypomania or elated mood, reports feeling less depressed since admission, denies passive or active SI intention or plan today she was started on 10 mg dosing of Abilify being titrated up and denies side effect to current medication  regimen.  She is able to discuss coping skills and stressors.  She continues to attend groups and participate in activities with the milieu.   Sleep  Sleep:Sleep: Fair Number of Hours of Sleep: 7.75   Principal Problem: Bipolar 1 disorder, mixed, severe (HCC) Diagnosis: Principal Problem:   Bipolar 1 disorder, mixed, severe (HCC) Active Problems:   PTSD (post-traumatic stress disorder)   Tobacco use disorder   Bipolar I disorder, current or most recent episode manic, severe (HCC)    Past Psychiatric History: History of bipolar disorder and PTSD, history of previous hospitalization at age 48 years old for mania, outpatient psychiatric treatment and care last time 6 months prior to this admission.  Past Medical History:  Past Medical History:  Diagnosis Date   Anxiety    Bipolar 1 disorder, manic, full remission (HCC)    CHF (congestive heart failure) (HCC)    COPD (chronic obstructive pulmonary disease) (HCC)    Coronary artery disease    Depression    GERD (gastroesophageal reflux disease)    Gout    High cholesterol    Hypertension    Hypothyroidism    Metabolic syndrome    NSTEMI (non-ST elevated myocardial infarction) (HCC) 07/01/2016   On home oxygen therapy    "available; don't use it" (07/01/2016)   PCOS (polycystic ovarian syndrome)    Stomach ulcer    Type 2 diabetes, diet controlled (HCC)     Past Surgical History:  Procedure Laterality Date   CARDIAC CATHETERIZATION N/A 07/01/2016   Procedure: LEFT HEART CATH AND CORONARY ANGIOGRAPHY;  Surgeon: Kathleene Hazel, MD;  Location: [facility] INVASIVE CV LAB;  Service: Cardiovascular;  Laterality: N/A;   CARDIAC CATHETERIZATION N/A 07/01/2016   Procedure: Coronary Stent Intervention;  Surgeon: Kathleene Hazel, MD;  Location: [facility] INVASIVE CV LAB;  Service: Cardiovascular;  Laterality: N/A;   CARPAL TUNNEL RELEASE Bilateral    CERVICAL BIOPSY  W/ LOOP ELECTRODE EXCISION  05/2016   "precancerous cells"    CERVICAL BIOPSY  W/ LOOP ELECTRODE EXCISION     CORONARY ANGIOPLASTY WITH STENT PLACEMENT  07/01/2016   TONSILLECTOMY     Family History:  Family History  Problem Relation Age of Onset   Diabetes Mother    Heart failure Mother    Heart attack Mother 45   Heart disease Mother    Alcohol abuse Mother    Arthritis Mother    COPD Mother    Depression Mother    Drug abuse Mother    Hyperlipidemia Mother    Hypertension Mother    Intellectual disability Mother    Healthy Son    Alcohol abuse Father    Arthritis Father    Depression Father    Intellectual disability Father    Early death Father    Alcohol abuse Sister    Arthritis Sister    Depression Sister    Intellectual disability Sister    Intellectual disability Maternal Grandmother    Hypertension Maternal Grandmother    Hyperlipidemia Maternal Grandmother    Heart disease Maternal Grandmother    Diabetes Maternal Grandmother    Depression Maternal Grandmother    COPD Maternal Grandmother    Alcohol abuse Maternal Grandfather    Arthritis Maternal Grandfather    COPD Maternal Grandfather    Diabetes Maternal Grandfather    Heart attack Maternal Grandfather    Heart disease Maternal Grandfather    Hyperlipidemia Maternal Grandfather    Hypertension Maternal Grandfather    Intellectual disability Maternal Grandfather    Arthritis Sister    Family Psychiatric  History: Mother - Bipolar disorder with suicide attempt Sister - depression, anxiety Multiple family members with alcohol use disorder No family history of completed suicide Social History: Patient is currently living in rented house with roommates. Separated from husband 2 months ago. Has three adopted kids (11,12,18). Youngest kid has autism spectrum disorder. 33 year old has Tourette's. Receives disability income ($900 per month). She enjoys crafting, going for walks, and exercising.   Sister Macon Large [phone])  Current Medications: Current  Facility-Administered Medications  Medication Dose Route Frequency Provider Last Rate Last Admin   alum & mag hydroxide-simeth (MAALOX/MYLANTA) 200-200-20 MG/5ML suspension 30 mL  30 mL Oral Q4H PRN Starkes-Perry, Juel Burrow, FNP       ARIPiprazole (ABILIFY) tablet 10 mg  10 mg Oral Daily Koi Zangara, MD   10 mg at 01/28/23 7371   aspirin EC tablet 81 mg  81 mg Oral Daily Jakaiya Netherland, MD   81 mg at 01/28/23 0829   atorvastatin (LIPITOR) tablet 80 mg  80 mg Oral Daily Vayden Weinand, MD   80 mg at 01/28/23 0626   diphenhydrAMINE (BENADRYL) capsule 50 mg  50 mg Oral TID PRN Maryagnes Amos, FNP       Or   diphenhydrAMINE (BENADRYL) injection 50 mg  50 mg Intramuscular TID PRN Starkes-Perry, Juel Burrow, FNP       empagliflozin (JARDIANCE) tablet 25 mg  25 mg Oral Daily Nhan Qualley, MD   25 mg at 01/28/23 0829   fenofibrate tablet 160 mg  160 mg Oral Daily Carey Lafon, MD   160 mg  at 01/28/23 0829   fluconazole (DIFLUCAN) tablet 150 mg  150 mg Oral Once Abbott Pao, Aldonia Keeven, MD       furosemide (LASIX) tablet 40 mg  40 mg Oral Daily Maurice Ramseur, MD   40 mg at 01/28/23 1610   haloperidol (HALDOL) tablet 5 mg  5 mg Oral TID PRN Maryagnes Amos, FNP       Or   haloperidol lactate (HALDOL) injection 5 mg  5 mg Intramuscular TID PRN Starkes-Perry, Juel Burrow, FNP       hydrOXYzine (ATARAX) tablet 25 mg  25 mg Oral TID PRN Abbott Pao, Karen Kinnard, MD       insulin aspart (novoLOG) injection 0-15 Units  0-15 Units Subcutaneous TID WC Abbott Pao, Britten Seyfried, MD   2 Units at 01/28/23 0717   irbesartan (AVAPRO) tablet 150 mg  150 mg Oral Daily Wassim Kirksey, MD   150 mg at 01/28/23 0829   lactulose (CHRONULAC) 10 GM/15ML solution 20 g  20 g Oral Once Tajuan Dufault, MD       LORazepam (ATIVAN) tablet 2 mg  2 mg Oral TID PRN Rosario Adie, Juel Burrow, FNP       Or   LORazepam (ATIVAN) injection 2 mg  2 mg Intramuscular TID PRN Starkes-Perry, Juel Burrow, FNP       magnesium hydroxide (MILK OF MAGNESIA) suspension 30 mL  30 mL  Oral Daily PRN Starkes-Perry, Juel Burrow, FNP       nicotine (NICODERM CQ - dosed in mg/24 hours) patch 14 mg  14 mg Transdermal Daily Massengill, Harrold Donath, MD   14 mg at 01/28/23 9604   nicotine polacrilex (NICORETTE) gum 2 mg  2 mg Oral PRN Abbott Pao, Burke Terry, MD       nitrofurantoin (macrocrystal-monohydrate) (MACROBID) capsule 100 mg  100 mg Oral Q12H Shanetha Bradham, MD   100 mg at 01/28/23 0829   polyethylene glycol (MIRALAX / GLYCOLAX) packet 17 g  17 g Oral Daily PRN Abbott Pao, Mandeep Ferch, MD       prazosin (MINIPRESS) capsule 2 mg  2 mg Oral QHS Makalah Asberry, MD   2 mg at 01/27/23 2125   traZODone (DESYREL) tablet 50 mg  50 mg Oral QHS PRN Sindy Guadeloupe, NP   50 mg at 01/25/23 2230   traZODone (DESYREL) tablet 50 mg  50 mg Oral QHS Abbott Pao, Sanjay Broadfoot, MD   50 mg at 01/27/23 2125    Lab Results:  Results for orders placed or performed during the hospital encounter of 01/25/23 (from the past 48 hour(s))  Glucose, capillary     Status: Abnormal   Collection Time: 01/26/23 12:04 PM  Result Value Ref Range   Glucose-Capillary 100 (H) 70 - 99 mg/dL    Comment: Glucose reference range applies only to samples taken after fasting for at least 8 hours.   Comment 1 Notify RN   Lipid panel     Status: Abnormal   Collection Time: 01/27/23  6:12 AM  Result Value Ref Range   Cholesterol 232 (H) 0 - 200 mg/dL   Triglycerides 540 (H) <150 mg/dL   HDL 32 (L) >98 mg/dL   Total CHOL/HDL Ratio 7.3 RATIO   VLDL 42 (H) 0 - 40 mg/dL   LDL Cholesterol 119 (H) 0 - 99 mg/dL    Comment:        Total Cholesterol/HDL:CHD Risk Coronary Heart Disease Risk Table                     Men  Women  1/2 Average Risk   3.4   3.3  Average Risk       5.0   4.4  2 X Average Risk   9.6   7.1  3 X Average Risk  23.4   11.0        Use the calculated Patient Ratio above and the CHD Risk Table to determine the patient's CHD Risk.        ATP III CLASSIFICATION (LDL):  <100     mg/dL   Optimal  409-811  mg/dL   Near or Above                     Optimal  130-159  mg/dL   Borderline  914-782  mg/dL   High  >956     mg/dL   Very High Performed at [facility], [address], [place], [place] [address]   Glucose, capillary     Status: Abnormal   Collection Time: 01/27/23 11:48 AM  Result Value Ref Range   Glucose-Capillary 132 (H) 70 - 99 mg/dL    Comment: Glucose reference range applies only to samples taken after fasting for at least 8 hours.  Glucose, capillary     Status: Abnormal   Collection Time: 01/27/23  5:09 PM  Result Value Ref Range   Glucose-Capillary 111 (H) 70 - 99 mg/dL    Comment: Glucose reference range applies only to samples taken after fasting for at least 8 hours.  Glucose, capillary     Status: Abnormal   Collection Time: 01/27/23 10:12 PM  Result Value Ref Range   Glucose-Capillary 166 (H) 70 - 99 mg/dL    Comment: Glucose reference range applies only to samples taken after fasting for at least 8 hours.  Glucose, capillary     Status: Abnormal   Collection Time: 01/28/23  6:20 AM  Result Value Ref Range   Glucose-Capillary 140 (H) 70 - 99 mg/dL    Comment: Glucose reference range applies only to samples taken after fasting for at least 8 hours.    Blood Alcohol level:  Lab Results  Component Value Date   [facility] <10 01/23/2023   ETH <11 12/07/2011    Metabolic Disorder Labs: Lab Results  Component Value Date   HGBA1C 6.0 (H) 01/24/2023   MPG 125.5 01/24/2023   MPG 146 07/01/2016   No results found for: "PROLACTIN" Lab Results  Component Value Date   CHOL 232 (H) 01/27/2023   TRIG 212 (H) 01/27/2023   HDL 32 (L) 01/27/2023   CHOLHDL 7.3 01/27/2023   VLDL 42 (H) 01/27/2023   LDLCALC 158 (H) 01/27/2023   LDLCALC UNABLE TO CALCULATE IF TRIGLYCERIDE OVER 400 mg/dL 65/78/4696    Physical Findings: AIMS: Facial and Oral Movements Muscles of Facial Expression: None, normal Lips and Perioral Area: None, normal Jaw: None, normal Tongue: None, normal,Extremity  Movements Upper (arms, wrists, hands, fingers): None, normal Lower (legs, knees, ankles, toes): None, normal, Trunk Movements Neck, shoulders, hips: None, normal, Overall Severity Severity of abnormal movements (highest score from questions above): None, normal Incapacitation due to abnormal movements: None, normal Patient's awareness of abnormal movements (rate only patient's report): No Awareness, Dental Status Current problems with teeth and/or dentures?: No Does patient usually wear dentures?: No  CIWA:    COWS:     Musculoskeletal: Strength & Muscle Tone: within normal limits Gait & Station: normal Patient leans: N/A  Psychiatric Specialty Exam:  General Appearance: Appears at stated  age, fairly dressed and groomed  Behavior: Calm and cooperative  Psychomotor Activity:No psychomotor agitation or retardation noted   Eye Contact: Fair Speech: Fluent, normal   Mood: Mildly dysphoric Affect: Restricted sad affect, improved  Thought Process: Linear and goal directed Descriptions of Associations: Intact, concrete Thought Content: Hallucinations: Denies AH, VH  Delusions: No paranoia  Suicidal Thoughts: Denies passive or active SI, intention, plan  Homicidal Thoughts: Denies HI, intention, plan   Alertness/Orientation: Alert and oriented  Insight: Improved Judgment: Improved  Memory: Intact  Executive Functions  Concentration: Fair Attention Span: Fair Recall: [employer] of Knowledge: Fair   Assets  Assets: Desire for Improvement; Financial Resources/Insurance; Social Support; Transportation    Physical Exam: Physical Exam Vitals and nursing note reviewed.    Review of Systems  Gastrointestinal:  Positive for constipation.  Genitourinary:        Whitish vaginal discharge reported as well as vaginal itching.  All other systems reviewed and are negative.  Blood pressure 99/80, pulse (!) 110, temperature 98.3 F (36.8 C), temperature source Oral,  resp. rate 14, height 5\' 7"  (1.702 m), weight 105.7 kg, SpO2 99%. Body mass index is 36.49 kg/m.   Treatment Plan Summary: Daily contact with patient to assess and evaluate symptoms and progress in treatment and Medication management  ASSESSMENT:  Diagnoses / Active Problems: Principal Problem: Bipolar 1 disorder, mixed, severe (HCC) Diagnosis: Principal Problem:   Bipolar 1 disorder, mixed, severe (HCC) Active Problems:   PTSD (post-traumatic stress disorder)   Tobacco use disorder   Bipolar I disorder, current or most recent episode manic, severe (HCC)   PLAN: Safety and Monitoring:  -- Voluntary admission to inpatient psychiatric unit for safety, stabilization and treatment  -- Daily contact with patient to assess and evaluate symptoms and progress in treatment  -- Patient's case to be discussed in multi-disciplinary team meeting  -- Observation Level : q15 minute checks  -- Vital signs:  q12 hours  -- Precautions: suicide, elopement, and assault  2. Medications:   Continue Abilify for mood stabilization and depression 10 mg daily started 7/27  Continue Minipress 2 mg at bedtime for nightmares  Continue trazodone 50 mg at bedtime scheduled for sleep  Continue trazodone 50 mg at bedtime as needed for sleep  Continue aspirin 81 mg, home medication  Continue Jardiance 20 mg daily for diabetes, home medication  Continue Avapro 150 mg daily for hypertension and heart failure, home medication  Continue Lasix 40 mg daily for heart failure, home medication  Continue Lipitor 80 mg daily and fenofibrate 160 mg daily for hyperlipidemia, home medication  Continue Macrobid 100 mg twice daily 7 days course for UTI  Start Diflucan 150 mg once dosing for vaginal candidiasis probably related to Macrobid use, will follow  Start lactulose one-time dosing for constipation, monitor efficacy  Continue Atarax 25 mg 3 times daily as needed for anxiety  The  risks/benefits/side-effects/alternatives to this medication were discussed in detail with the patient and time was given for questions. The patient consents to medication trial.    -- Metabolic profile and EKG monitoring obtained while on an atypical antipsychotic (BMI: Lipid Panel: HbgA1c: QTc:)      3. Pertinent labs: CMP no significant abnormalities noted, CBC no significant abnormalities noted, TSH within normal level, HIV nonreactive, EKG 7/23 QTc 481 and 461, hemoglobin A1c 6.0, lipid panel indicated elevated cholesterol 232, low HDL 32, high LDL 158, high triglycerides 212      Lab ordered: Repeat EKG 7/28  4. Tobacco Use Disorder  -- Nicotine patch 14mg /24 hours ordered in addition to Nicorette gum  -- Smoking cessation encouraged  5. Group and Therapy: -- Encouraged patient to participate in unit milieu and in scheduled group therapies     -- Short Term Goals: Ability to identify changes in lifestyle to reduce recurrence of condition will improve, Ability to verbalize feelings will improve, Ability to disclose and discuss suicidal ideas, Ability to demonstrate self-control will improve, and Ability to identify and develop effective coping behaviors will improve  -- Long Term Goals: Improvement in symptoms so as ready for discharge  6. Discharge Planning:   -- Social work and case management to assist with discharge planning and identification of hospital follow-up needs prior to discharge  -- Estimated LOS: 5-7 days  -- Discharge Concerns: Need to establish a safety plan; Medication compliance and effectiveness  -- Discharge Goals: Return home with outpatient referrals for mental health follow-up including medication management/psychotherapy  Patient's sister is supportive planning to get patient to stay in a motel room short-term after discharge from here until able to get her longer-term place to stay.    Total Time Spent in Direct Patient Care:  I personally spent 35 minutes  on the unit in direct patient care. The direct patient care time included face-to-face time with the patient, reviewing the patient's chart, communicating with other professionals, and coordinating care. Greater than 50% of this time was spent in counseling or coordinating care with the patient regarding goals of hospitalization, psycho-education, and discharge planning needs.   Johnathen Testa Abbott Pao, MD 01/28/2023, 9:36 AM

## 2023-01-29 LAB — BASIC METABOLIC PANEL
Anion gap: 11 (ref 5–15)
BUN: 14 mg/dL (ref 6–20)
CO2: 31 mmol/L (ref 22–32)
Calcium: 9.5 mg/dL (ref 8.9–10.3)
Chloride: 94 mmol/L — ABNORMAL LOW (ref 98–111)
Creatinine, Ser: 0.89 mg/dL (ref 0.44–1.00)
GFR, Estimated: >60 mL/min (ref >60)
Glucose, Bld: 178 mg/dL — ABNORMAL HIGH (ref 70–99)
Potassium: 3.7 mmol/L (ref 3.5–5.1)
Sodium: 136 mmol/L (ref 135–145)

## 2023-01-29 LAB — GLUCOSE, CAPILLARY
Glucose-Capillary: 148 mg/dL — ABNORMAL HIGH (ref 70–99)
Glucose-Capillary: 171 mg/dL — ABNORMAL HIGH (ref 70–99)
Glucose-Capillary: 99 mg/dL (ref 70–99)
Glucose-Capillary: 145 mg/dL — ABNORMAL HIGH (ref 70–99)
Glucose-Capillary: 157 mg/dL — ABNORMAL HIGH (ref 70–99)

## 2023-01-29 LAB — MAGNESIUM: Magnesium: 2.3 mg/dL (ref 1.7–2.4)

## 2023-01-29 NOTE — Group Note (Signed)
Date:  01/29/2023 Time:  11:56 AM  Group Topic/Focus:  Goals Group:   The focus of this group is to help patients establish daily goals to achieve during treatment and discuss how the patient can incorporate goal setting into their daily lives to aide in recovery.    Participation Level:  Active  Participation Quality:  Appropriate  Affect:  Appropriate  Cognitive:  Appropriate  Insight: Appropriate  Engagement in Group:  Engaged  Modes of Intervention:  Discussion  Additional Comments:     Reymundo Poll 01/29/2023, 11:56 AM

## 2023-01-29 NOTE — Progress Notes (Signed)
   01/28/23 2253  Psych Admission Type (Psych Patients Only)  Admission Status Voluntary  Psychosocial Assessment  Patient Complaints Sleep disturbance;Anxiety  Eye Contact Fair  Facial Expression Animated;Anxious  Affect Anxious  Speech Logical/coherent  Interaction Assertive  Motor Activity Fidgety  Appearance/Hygiene Unremarkable  Behavior Characteristics Cooperative;Fidgety  Mood Depressed  Thought Process  Coherency WDL  Content Blaming self  Delusions WDL  Perception WDL  Hallucination None reported or observed  Judgment Limited  Confusion WDL  Danger to Self  Current suicidal ideation? Denies  Danger to Others  Danger to Others None reported or observed   Pt affect anxious, mood depressed, states she was upset earlier because of her ex of 9yrs, currently denies SI/HI or hallucinations (a) 15 min checks (r) safety maintained.

## 2023-01-29 NOTE — [facility] Group Notes (Signed)
01/29/2023 11:00am  Type of Group and Topic: Psychoeducational Group:  Wellness  Participation Level:  did not attend  Description of Group  Wellness group introduces the topic and its focus on developing healthy habits across the spectrum and its relationship to a decrease in hospital admissions.  Six areas of wellness are discussed: physical, social spiritual, intellectual, occupational, and emotional.  Patients are asked to consider their current wellness habits and to identify areas of wellness where they are interested and able to focus on improvements.    Therapeutic Goals Patients will understand components of wellness and how they can positively impact overall health.  Patients will identify areas of wellness where they have developed good habits. Patients will identify areas of wellness where they would like to make improvements.    Summary of Patient Progress     Therapeutic Modalities: Cognitive Behavioral Therapy Psychoeducation    Lorri Frederick, LCSW

## 2023-01-29 NOTE — [facility] Group Notes (Signed)
[facility] Group Notes:  (Nursing/MHT/Case Management/Adjunct)  Date:  01/29/2023  Time:  10:01 PM  Type of Therapy:  Wrap Up Group  Participation Level:  Active  Participation Quality:  Appropriate  Affect:  Appropriate  Cognitive:  Appropriate  Insight:  Appropriate  Engagement in Group:  Improving  Modes of Intervention:  Activity  Summary of Progress/Problems: Pt said she had a good day Mariadelcarmen Corella E Poppy Mcafee 01/29/2023, 10:01 PM

## 2023-01-29 NOTE — Progress Notes (Signed)
   01/29/23 1000  Psych Admission Type (Psych Patients Only)  Admission Status Voluntary  Psychosocial Assessment  Patient Complaints None  Eye Contact Fair  Facial Expression Animated;Anxious  Affect Anxious  Speech Logical/coherent  Interaction Assertive  Motor Activity Fidgety  Appearance/Hygiene Unremarkable  Behavior Characteristics Cooperative;Appropriate to situation  Mood Depressed;Anxious  Thought Process  Coherency WDL  Content Blaming others  Delusions None reported or observed  Perception WDL  Hallucination None reported or observed  Judgment Limited  Confusion WDL  Danger to Self  Current suicidal ideation? Denies  Agreement Not to Harm Self Yes  Description of Agreement verbal  Danger to Others  Danger to Others None reported or observed

## 2023-01-29 NOTE — Group Note (Signed)
Date:  01/29/2023 Time:  3:46 PM  Group Topic/Focus:  Wellness Toolbox:   The focus of this group is to discuss various aspects of wellness, balancing those aspects and exploring ways to increase the ability to experience wellness.  Patients will create a wellness toolbox for use upon discharge.    Participation Level:  Active  Participation Quality:  Appropriate  Affect:  Appropriate  Cognitive:  Appropriate  Insight: Appropriate  Engagement in Group:  Engaged  Modes of Intervention:  Education  Additional Comments:                             Reymundo Poll 01/29/2023, 3:46 PM

## 2023-01-29 NOTE — Progress Notes (Signed)
[facility] MD Progress Note  01/29/2023 10:54 AM Kaitlyn Chambers  MRN:  [id number]   Reason for Admission:  Kaitlyn Chambers is a 42 yr old female with a past psychiatric history of Bipolar disorder and PTSD, and a past medical history of HFpEF, COPD, HTN, hyperlipidemia, hypothyroidism, and T2DM who presented to the ED and was admitted for suicide attempt with intentional acetaminophen overdose. The patient is currently on Hospital Day 4.   Chart Review from last 24 hours:  The patient's chart was reviewed and nursing notes were reviewed. The patient's case was discussed in multidisciplinary team meeting. Per [facility] patient is compliant with prescribed medications as needed trazodone 50 mg was used only once on 7/24 otherwise patient was reported to have fairly good sleep with the scheduled Minipress and trazodone.  Patient used as needed Ativan 7/27 prescribed for agitation, was not given for agitation but for increased anxiety after was noted by staff to be tearful after a phone call with her son.  Information Obtained Today During Patient Interview: The patient was seen and evaluated on the unit. On assessment today the patient reports she had a phone call with her son yesterday during which she learned that he was not eating appropriate meals staying with his father so she got upset she tells me that she made a statement "I want to kick in in the forehead" she denies making the statement to put a bullet in her ex husband's head as was reported to staff by another peer.  She notes she makes a statement out of joking but did not have any intention or plan to harm him and denies any at this time, she denies any access to guns or weapons and does not have any history of physical aggression in the past.  She continues to deny passive or active SI intention or plan since admission and reports improved mood stability and depression since admission.  She reports fair sleep and appetite, compliant with medication and  denies side effects to medications.  She had 1 bowel movement yesterday after use lactulose.  She reports improving vaginal itching and vaginal discharge after receiving Diflucan 1 dose. She has court on Tuesday at 8:30 in the morning and [facility], she has a plan after discharge to be staying in [place] at [employer] which will be reserved by her sister, she notes that after the court is completed her sister is planning to get her into "a tiny home on her property", patient agrees for me to contact her sister to confirm the plan [name] at [phone]  After evaluated by this provider patient complained of lightheadedness and headache, denies chest pain or shortness of breath, repeat EKG this morning revealed normal sinus rhythm but prolonged QTc 510, please note patient had prolonged QTc at time of admission and per chart review in 2021 QTc 469, blood pressure 111/75, pulse 103 temp 97.7, blood sugar 171 Phone call was made to cardiologist on call Dr Anne Fu who returned call, chart was reviewed and case was discussed, recommended repeat EKG tomorrow morning and monitor vitals, no immediate concerns at this time for emergency room visit or face-to-face evaluation, will follow.  Sleep  Sleep: Good sleep reported   Principal Problem: Bipolar 1 disorder, mixed, severe (HCC) Diagnosis: Principal Problem:   Bipolar 1 disorder, mixed, severe (HCC) Active Problems:   PTSD (post-traumatic stress disorder)   Tobacco use disorder   Bipolar I disorder, current or most recent episode manic, severe (HCC)  Past Psychiatric History: History of bipolar disorder and PTSD, history of previous hospitalization at age 41 years old for mania, outpatient psychiatric treatment and care last time 6 months prior to this admission.  Past Medical History:  Past Medical History:  Diagnosis Date   Anxiety    Bipolar 1 disorder, manic, full remission (HCC)    CHF (congestive heart failure) (HCC)     COPD (chronic obstructive pulmonary disease) (HCC)    Coronary artery disease    Depression    GERD (gastroesophageal reflux disease)    Gout    High cholesterol    Hypertension    Hypothyroidism    Metabolic syndrome    NSTEMI (non-ST elevated myocardial infarction) (HCC) 07/01/2016   On home oxygen therapy    "available; don't use it" (07/01/2016)   PCOS (polycystic ovarian syndrome)    Stomach ulcer    Type 2 diabetes, diet controlled (HCC)     Past Surgical History:  Procedure Laterality Date   CARDIAC CATHETERIZATION N/A 07/01/2016   Procedure: LEFT HEART CATH AND CORONARY ANGIOGRAPHY;  Surgeon: Kathleene Hazel, MD;  Location: [facility] INVASIVE CV LAB;  Service: Cardiovascular;  Laterality: N/A;   CARDIAC CATHETERIZATION N/A 07/01/2016   Procedure: Coronary Stent Intervention;  Surgeon: Kathleene Hazel, MD;  Location: [facility] INVASIVE CV LAB;  Service: Cardiovascular;  Laterality: N/A;   CARPAL TUNNEL RELEASE Bilateral    CERVICAL BIOPSY  W/ LOOP ELECTRODE EXCISION  05/2016   "precancerous cells"   CERVICAL BIOPSY  W/ LOOP ELECTRODE EXCISION     CORONARY ANGIOPLASTY WITH STENT PLACEMENT  07/01/2016   TONSILLECTOMY     Family History:  Family History  Problem Relation Age of Onset   Diabetes Mother    Heart failure Mother    Heart attack Mother 74   Heart disease Mother    Alcohol abuse Mother    Arthritis Mother    COPD Mother    Depression Mother    Drug abuse Mother    Hyperlipidemia Mother    Hypertension Mother    Intellectual disability Mother    Healthy Son    Alcohol abuse Father    Arthritis Father    Depression Father    Intellectual disability Father    Early death Father    Alcohol abuse Sister    Arthritis Sister    Depression Sister    Intellectual disability Sister    Intellectual disability Maternal Grandmother    Hypertension Maternal Grandmother    Hyperlipidemia Maternal Grandmother    Heart disease Maternal Grandmother    Diabetes  Maternal Grandmother    Depression Maternal Grandmother    COPD Maternal Grandmother    Alcohol abuse Maternal Grandfather    Arthritis Maternal Grandfather    COPD Maternal Grandfather    Diabetes Maternal Grandfather    Heart attack Maternal Grandfather    Heart disease Maternal Grandfather    Hyperlipidemia Maternal Grandfather    Hypertension Maternal Grandfather    Intellectual disability Maternal Grandfather    Arthritis Sister    Family Psychiatric  History: Mother - Bipolar disorder with suicide attempt Sister - depression, anxiety Multiple family members with alcohol use disorder No family history of completed suicide Social History: Patient is currently living in rented house with roommates. Separated from husband 2 months ago. Has three adopted kids (11,12,18). Youngest kid has autism spectrum disorder. 29 year old has Tourette's. Receives disability income ($900 per month). She enjoys crafting, going for walks, and exercising.  Sister Macon Large [phone])  Current Medications: Current Facility-Administered Medications  Medication Dose Route Frequency Provider Last Rate Last Admin   alum & mag hydroxide-simeth (MAALOX/MYLANTA) 200-200-20 MG/5ML suspension 30 mL  30 mL Oral Q4H PRN Starkes-Perry, Juel Burrow, FNP       ARIPiprazole (ABILIFY) tablet 10 mg  10 mg Oral Daily Maripat Borba, MD   10 mg at 01/29/23 0901   aspirin EC tablet 81 mg  81 mg Oral Daily Benard Minturn, MD   81 mg at 01/29/23 0902   atorvastatin (LIPITOR) tablet 80 mg  80 mg Oral Daily Kaydence Menard, MD   80 mg at 01/29/23 0102   diphenhydrAMINE (BENADRYL) capsule 50 mg  50 mg Oral TID PRN Maryagnes Amos, FNP       Or   diphenhydrAMINE (BENADRYL) injection 50 mg  50 mg Intramuscular TID PRN Starkes-Perry, Juel Burrow, FNP       empagliflozin (JARDIANCE) tablet 25 mg  25 mg Oral Daily Tymika Grilli, MD   25 mg at 01/29/23 0902   fenofibrate tablet 160 mg  160 mg Oral Daily Deola Rewis, MD   160 mg at  01/29/23 0902   furosemide (LASIX) tablet 40 mg  40 mg Oral Daily Mathews Stuhr, MD   40 mg at 01/29/23 7253   haloperidol (HALDOL) tablet 5 mg  5 mg Oral TID PRN Maryagnes Amos, FNP       Or   haloperidol lactate (HALDOL) injection 5 mg  5 mg Intramuscular TID PRN Starkes-Perry, Juel Burrow, FNP       hydrOXYzine (ATARAX) tablet 25 mg  25 mg Oral TID PRN Abbott Pao, Alim Cattell, MD       insulin aspart (novoLOG) injection 0-15 Units  0-15 Units Subcutaneous TID WC Kitzia Camus, MD   2 Units at 01/29/23 0646   irbesartan (AVAPRO) tablet 150 mg  150 mg Oral Daily Gurleen Larrivee, MD   150 mg at 01/29/23 0901   LORazepam (ATIVAN) tablet 2 mg  2 mg Oral TID PRN Maryagnes Amos, FNP   2 mg at 01/28/23 6644   Or   LORazepam (ATIVAN) injection 2 mg  2 mg Intramuscular TID PRN Starkes-Perry, Juel Burrow, FNP       magnesium hydroxide (MILK OF MAGNESIA) suspension 30 mL  30 mL Oral Daily PRN Starkes-Perry, Juel Burrow, FNP       nicotine (NICODERM CQ - dosed in mg/24 hours) patch 14 mg  14 mg Transdermal Daily Massengill, Harrold Donath, MD   14 mg at 01/29/23 0347   nicotine polacrilex (NICORETTE) gum 2 mg  2 mg Oral PRN Abbott Pao, Zaynah Chawla, MD       nitrofurantoin (macrocrystal-monohydrate) (MACROBID) capsule 100 mg  100 mg Oral Q12H Oluwadarasimi Redmon, MD   100 mg at 01/29/23 0903   polyethylene glycol (MIRALAX / GLYCOLAX) packet 17 g  17 g Oral Daily PRN Abbott Pao, Annleigh Knueppel, MD   17 g at 01/28/23 0959   prazosin (MINIPRESS) capsule 2 mg  2 mg Oral QHS Jeannia Tatro, MD   2 mg at 01/28/23 2128   traZODone (DESYREL) tablet 50 mg  50 mg Oral QHS PRN Sindy Guadeloupe, NP   50 mg at 01/25/23 2230   traZODone (DESYREL) tablet 50 mg  50 mg Oral QHS Abbott Pao, Steffan Caniglia, MD   50 mg at 01/28/23 2127    Lab Results:  Results for orders placed or performed during the hospital encounter of 01/25/23 (from the past 48 hour(s))  Glucose, capillary  Status: Abnormal   Collection Time: 01/27/23 11:48 AM  Result Value Ref Range   Glucose-Capillary 132  (H) 70 - 99 mg/dL    Comment: Glucose reference range applies only to samples taken after fasting for at least 8 hours.  Glucose, capillary     Status: Abnormal   Collection Time: 01/27/23  5:09 PM  Result Value Ref Range   Glucose-Capillary 111 (H) 70 - 99 mg/dL    Comment: Glucose reference range applies only to samples taken after fasting for at least 8 hours.  Glucose, capillary     Status: Abnormal   Collection Time: 01/27/23 10:12 PM  Result Value Ref Range   Glucose-Capillary 166 (H) 70 - 99 mg/dL    Comment: Glucose reference range applies only to samples taken after fasting for at least 8 hours.  Glucose, capillary     Status: Abnormal   Collection Time: 01/28/23  6:20 AM  Result Value Ref Range   Glucose-Capillary 140 (H) 70 - 99 mg/dL    Comment: Glucose reference range applies only to samples taken after fasting for at least 8 hours.  Glucose, capillary     Status: Abnormal   Collection Time: 01/28/23 11:35 AM  Result Value Ref Range   Glucose-Capillary 120 (H) 70 - 99 mg/dL    Comment: Glucose reference range applies only to samples taken after fasting for at least 8 hours.  Glucose, capillary     Status: None   Collection Time: 01/28/23  5:12 PM  Result Value Ref Range   Glucose-Capillary 93 70 - 99 mg/dL    Comment: Glucose reference range applies only to samples taken after fasting for at least 8 hours.  Glucose, capillary     Status: Abnormal   Collection Time: 01/28/23  8:50 PM  Result Value Ref Range   Glucose-Capillary 166 (H) 70 - 99 mg/dL    Comment: Glucose reference range applies only to samples taken after fasting for at least 8 hours.   Comment 1 Notify RN   Glucose, capillary     Status: Abnormal   Collection Time: 01/29/23  6:00 AM  Result Value Ref Range   Glucose-Capillary 148 (H) 70 - 99 mg/dL    Comment: Glucose reference range applies only to samples taken after fasting for at least 8 hours.  Glucose, capillary     Status: Abnormal   Collection  Time: 01/29/23  9:49 AM  Result Value Ref Range   Glucose-Capillary 171 (H) 70 - 99 mg/dL    Comment: Glucose reference range applies only to samples taken after fasting for at least 8 hours.   Comment 1 Notify RN    Comment 2 Document in Chart     Blood Alcohol level:  Lab Results  Component Value Date   ETH <10 01/23/2023   ETH <11 12/07/2011    Metabolic Disorder Labs: Lab Results  Component Value Date   HGBA1C 6.0 (H) 01/24/2023   MPG 125.5 01/24/2023   MPG 146 07/01/2016   No results found for: "PROLACTIN" Lab Results  Component Value Date   CHOL 232 (H) 01/27/2023   TRIG 212 (H) 01/27/2023   HDL 32 (L) 01/27/2023   CHOLHDL 7.3 01/27/2023   VLDL 42 (H) 01/27/2023   LDLCALC 158 (H) 01/27/2023   LDLCALC UNABLE TO CALCULATE IF TRIGLYCERIDE OVER 400 mg/dL 16/04/9603    Physical Findings: AIMS: Facial and Oral Movements Muscles of Facial Expression: None, normal Lips and Perioral Area: None, normal Jaw: None, normal Tongue:  None, normal,Extremity Movements Upper (arms, wrists, hands, fingers): None, normal Lower (legs, knees, ankles, toes): None, normal, Trunk Movements Neck, shoulders, hips: None, normal, Overall Severity Severity of abnormal movements (highest score from questions above): None, normal Incapacitation due to abnormal movements: None, normal Patient's awareness of abnormal movements (rate only patient's report): No Awareness, Dental Status Current problems with teeth and/or dentures?: No Does patient usually wear dentures?: No  CIWA:    COWS:     Musculoskeletal: Strength & Muscle Tone: within normal limits Gait & Station: normal Patient leans: N/A  Psychiatric Specialty Exam:  General Appearance: Appears at stated age, fairly dressed and groomed  Behavior: Calm and cooperative  Psychomotor Activity:No psychomotor agitation or retardation noted   Eye Contact: Fair Speech: Fluent, normal   Mood: Mildly dysphoric Affect: Restricted  sad affect, improved  Thought Process: Linear and goal directed Descriptions of Associations: Intact, concrete Thought Content: Hallucinations: Denies AH, VH  Delusions: No paranoia  Suicidal Thoughts: Denies passive or active SI, intention, plan  Homicidal Thoughts: Denies HI, intention, plan   Alertness/Orientation: Alert and oriented  Insight: Improved Judgment: Improved  Memory: Intact  Executive Functions  Concentration: Fair Attention Span: Fair Recall: [employer] of Knowledge: Fair   Assets  Assets: Desire for Improvement; Financial Resources/Insurance; Social Support; Transportation    Physical Exam: Physical Exam Vitals and nursing note reviewed.    Review of Systems  Gastrointestinal:  Negative for constipation.  Genitourinary:        Whitish vaginal discharge reported as well as vaginal itching.  Neurological:  Positive for dizziness.  All other systems reviewed and are negative.  Blood pressure 111/75, pulse (!) 102, temperature 97.7 F (36.5 C), temperature source Oral, resp. rate 18, height 5\' 7"  (1.702 m), weight 105.7 kg, SpO2 100%. Body mass index is 36.49 kg/m.   Treatment Plan Summary: Daily contact with patient to assess and evaluate symptoms and progress in treatment and Medication management  ASSESSMENT:  Diagnoses / Active Problems: Principal Problem: Bipolar 1 disorder, mixed, severe (HCC) Diagnosis: Principal Problem:   Bipolar 1 disorder, mixed, severe (HCC) Active Problems:   PTSD (post-traumatic stress disorder)   Tobacco use disorder   Bipolar I disorder, current or most recent episode manic, severe (HCC)   PLAN: Safety and Monitoring:  -- Voluntary admission to inpatient psychiatric unit for safety, stabilization and treatment  -- Daily contact with patient to assess and evaluate symptoms and progress in treatment  -- Patient's case to be discussed in multi-disciplinary team meeting  -- Observation Level : q15 minute  checks  -- Vital signs:  q12 hours  -- Precautions: suicide, elopement, and assault  2. Medications:   Continue Abilify for mood stabilization and depression 10 mg daily started 7/27  Continue Minipress 2 mg at bedtime for nightmares  Continue trazodone 50 mg at bedtime scheduled for sleep  Continue trazodone 50 mg at bedtime as needed for sleep  Continue aspirin 81 mg, home medication  Continue Jardiance 20 mg daily for diabetes, home medication  Continue Avapro 150 mg daily for hypertension and heart failure, home medication  Continue Lasix 40 mg daily for heart failure, home medication  Continue Lipitor 80 mg daily and fenofibrate 160 mg daily for hyperlipidemia, home medication  Continue Macrobid 100 mg twice daily 7 days course for UTI    Continue Atarax 25 mg 3 times daily as needed for anxiety  The risks/benefits/side-effects/alternatives to this medication were discussed in detail with the patient and time was given  for questions. The patient consents to medication trial.    -- Metabolic profile and EKG monitoring obtained while on an atypical antipsychotic (BMI: Lipid Panel: HbgA1c: QTc:)      3. Pertinent labs: CMP no significant abnormalities noted, CBC no significant abnormalities noted, TSH within normal level, HIV nonreactive, EKG 7/23 QTc 481 and 461, hemoglobin A1c 6.0, lipid panel indicated elevated cholesterol 232, low HDL 32, high LDL 158, high triglycerides 212 EKG 7/28 QTc 510     Lab ordered: None   4. Tobacco Use Disorder  -- Nicotine patch 14mg /24 hours ordered in addition to Nicorette gum  -- Smoking cessation encouraged  5. Group and Therapy: -- Encouraged patient to participate in unit milieu and in scheduled group therapies     -- Short Term Goals: Ability to identify changes in lifestyle to reduce recurrence of condition will improve, Ability to verbalize feelings will improve, Ability to disclose and discuss suicidal ideas, Ability to demonstrate  self-control will improve, and Ability to identify and develop effective coping behaviors will improve  -- Long Term Goals: Improvement in symptoms so as ready for discharge  6. Discharge Planning:   -- Social work and case management to assist with discharge planning and identification of hospital follow-up needs prior to discharge  -- Estimated LOS: 5-7 days  -- Discharge Concerns: Need to establish a safety plan; Medication compliance and effectiveness  -- Discharge Goals: Return home with outpatient referrals for mental health follow-up including medication management/psychotherapy  Patient's sister is supportive planning to get patient to stay in a motel room short-term after discharge from here until able to get her longer-term place to stay.    Total Time Spent in Direct Patient Care:  I personally spent 35 minutes on the unit in direct patient care. The direct patient care time included face-to-face time with the patient, reviewing the patient's chart, communicating with other professionals, and coordinating care. Greater than 50% of this time was spent in counseling or coordinating care with the patient regarding goals of hospitalization, psycho-education, and discharge planning needs.   Dimitriy Carreras Abbott Pao, MD 01/29/2023, 10:54 AM

## 2023-01-29 NOTE — Progress Notes (Signed)
   01/29/23 2136  Psych Admission Type (Psych Patients Only)  Admission Status Voluntary  Psychosocial Assessment  Patient Complaints None  Facial Expression Animated  Affect Anxious  Speech Logical/coherent  Interaction Assertive  Motor Activity Fidgety  Appearance/Hygiene Unremarkable  Behavior Characteristics Cooperative  Mood Anxious  Thought Process  Coherency WDL  Content WDL  Delusions None reported or observed  Perception WDL  Hallucination None reported or observed  Judgment Impaired  Confusion None  Danger to Self  Current suicidal ideation? Denies  Agreement Not to Harm Self Yes  Description of Agreement verbal contract for safety  Danger to Others  Danger to Others None reported or observed   D: Patient reports she had a good day after  feeling light headed earlier today. Pt attended evening wrap up group and engaged in discussion. A: Medications administered as prescribed. Support and encouragement provided as needed.  R: Patient remains safe on the unit. Plan of care ongoing for safety and stability.

## 2023-01-29 NOTE — Progress Notes (Signed)
Pt reported that she feels lightheaded and her right side of her body feels weird. This writer checked Pt blood pressure 111/75 p 103, temp 97.7 and blood sugar (171). Pt wanted to go back to her room and lay down. RN, Christian Mate is reported

## 2023-01-30 DIAGNOSIS — F3113 Bipolar disorder, current episode manic without psychotic features, severe: Secondary | ICD-10-CM

## 2023-01-30 LAB — GLUCOSE, CAPILLARY: Glucose-Capillary: 135 mg/dL — ABNORMAL HIGH (ref 70–99)

## 2023-01-30 MED ORDER — NITROFURANTOIN MONOHYD MACRO 100 MG PO CAPS: 100.0000 mg | ORAL_CAPSULE | Freq: Two times a day (BID) | ORAL | 0 refills | Status: AC

## 2023-01-30 MED ORDER — TRAZODONE HCL 50 MG PO TABS: 50.0000 mg | ORAL_TABLET | Freq: Every day | ORAL | 0 refills | Status: AC

## 2023-01-30 MED ORDER — EMPAGLIFLOZIN 25 MG PO TABS: 25.0000 mg | ORAL_TABLET | Freq: Every day | ORAL | 0 refills | Status: AC

## 2023-01-30 MED ORDER — FENOFIBRATE 160 MG PO TABS: 160.0000 mg | ORAL_TABLET | Freq: Every day | ORAL | 0 refills | Status: AC

## 2023-01-30 MED ORDER — FUROSEMIDE 40 MG PO TABS: 40.0000 mg | ORAL_TABLET | Freq: Every day | ORAL | 0 refills | Status: AC

## 2023-01-30 MED ORDER — ARIPIPRAZOLE 10 MG PO TABS: 10.0000 mg | ORAL_TABLET | Freq: Every day | ORAL | 0 refills | Status: AC

## 2023-01-30 MED ORDER — PRAZOSIN HCL 1 MG PO CAPS: 2.0000 mg | ORAL_CAPSULE | Freq: Every day | ORAL | 0 refills | Status: AC

## 2023-01-30 MED ORDER — ATORVASTATIN CALCIUM 80 MG PO TABS: 80.0000 mg | ORAL_TABLET | Freq: Every day | ORAL | 0 refills | Status: AC

## 2023-01-30 MED ORDER — IRBESARTAN 150 MG PO TABS: 150.0000 mg | ORAL_TABLET | Freq: Every day | ORAL | 0 refills | Status: AC

## 2023-01-30 MED ORDER — ASPIRIN 81 MG PO TBEC: 81.0000 mg | DELAYED_RELEASE_TABLET | Freq: Every day | ORAL | 0 refills | Status: AC

## 2023-01-30 MED ORDER — NICOTINE 14 MG/24HR TD PT24: 14.0000 mg | MEDICATED_PATCH | Freq: Every day | TRANSDERMAL | 0 refills | Status: AC

## 2023-01-30 NOTE — Discharge Summary (Signed)
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 01/30/2023, 10:10 AM

## 2023-01-30 NOTE — Progress Notes (Signed)
  [facility] Adult Case Management Discharge Plan :  Will you be returning to the same living situation after discharge:  No. (336) [name] [phone] (Sister)  At discharge, do you have transportation home?: Yes,  (336) [name] [phone] (Sister)  Do you have the ability to pay for your medications: Yes,  Insured Medicare  Release of information consent forms completed and in the chart;  Patient's signature needed at discharge.  Patient to Follow up at:  Follow-up Information     [facility]. Go to.   Specialty: Behavioral Health Why: For fastest service, please go to this provider for an assessment, to obtain therapy and medication management services on Monday through Friday, arrive by 7:00 am. Contact information: [address] [place] [address] [phone]        Center, [facility] Go on 02/01/2023.   Why: You have an appointment for primary care services on  02/01/23 at 1:00 pm.   This appointment will be held in person. Contact information: [address] [place] [address] [phone]                 Next level of care provider has access to [facility] Link:yes  Safety Planning and Suicide Prevention discussed: Yes,  (336) [name] [phone] (Sister)      Has patient been referred to the Quitline?: Patient refused referral for treatment  Patient has been referred for addiction treatment: [facility], [facility], [facility] Patient to continue working towards treatment goals after discharge. Patient no longer meets criteria for inpatient criteria per attending physician. Continue taking medications as prescribed, nursing to provide instructions at discharge. Follow up with all scheduled appointments.   Kaitlyn Chambers S Lancelot Alyea, LCSW 01/30/2023, 9:39 AM

## 2023-01-30 NOTE — Care Management Important Message (Signed)
Medicare IM printed and given to Joy Buford, LCSW to give to the patient.

## 2023-01-30 NOTE — Progress Notes (Signed)
   01/30/23 0800  Psych Admission Type (Psych Patients Only)  Admission Status Voluntary  Psychosocial Assessment  Patient Complaints None  Eye Contact Fair  Facial Expression Animated  Affect Appropriate to circumstance  Speech Logical/coherent  Interaction Assertive  Motor Activity Fidgety  Appearance/Hygiene Unremarkable  Behavior Characteristics Cooperative;Appropriate to situation  Mood Pleasant  Thought Process  Coherency WDL  Content WDL  Delusions None reported or observed  Perception WDL  Hallucination None reported or observed  Judgment Impaired  Confusion None  Danger to Self  Current suicidal ideation? Denies  Agreement Not to Harm Self Yes  Description of Agreement verbal  Danger to Others  Danger to Others None reported or observed

## 2023-01-30 NOTE — Progress Notes (Signed)
Discharge Note:  Patient denies SI/HI/AVH at this time. Discharge instructions, AVS, prescriptions, and transition record gone over with patient. Patient agrees to comply with medication management, follow-up visit, and outpatient therapy. Patient belongings returned to patient. Patient questions and concerns addressed and answered. Patient ambulatory off unit. Patient discharged sister's care.

## 2023-01-30 NOTE — Plan of Care (Signed)
  Problem: Education: Goal: Emotional status will improve Outcome: Progressing   Problem: Education: Goal: Mental status will improve Outcome: Progressing   
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
## 2023-02-10 DIAGNOSIS — J449 Chronic obstructive pulmonary disease, unspecified: Secondary | ICD-10-CM | POA: Diagnosis not present

## 2023-03-13 DIAGNOSIS — J449 Chronic obstructive pulmonary disease, unspecified: Secondary | ICD-10-CM | POA: Diagnosis not present

## 2023-04-12 DIAGNOSIS — J449 Chronic obstructive pulmonary disease, unspecified: Secondary | ICD-10-CM | POA: Diagnosis not present

## 2023-05-13 DIAGNOSIS — J449 Chronic obstructive pulmonary disease, unspecified: Secondary | ICD-10-CM | POA: Diagnosis not present

## 2023-10-30 ENCOUNTER — Telehealth (HOSPITAL_COMMUNITY): Payer: Self-pay

## 2023-10-30 NOTE — Telephone Encounter (Signed)
 Avie Boeck with [facility] emails this therapist with [facility] and asks for an update. Therapist sends email with this response: Note from there indicated she was to follow up with [facility] medical care.  Earnie Gola, MS, LMFT, LCAS

## 2024-01-19 ENCOUNTER — Other Ambulatory Visit: Payer: Self-pay | Admitting: Medical Genetics

## 2024-01-23 ENCOUNTER — Other Ambulatory Visit (HOSPITAL_COMMUNITY)

## 2024-02-16 DIAGNOSIS — E119 Type 2 diabetes mellitus without complications: Secondary | ICD-10-CM | POA: Diagnosis not present

## 2024-02-16 DIAGNOSIS — Z0000 Encounter for general adult medical examination without abnormal findings: Secondary | ICD-10-CM | POA: Diagnosis not present

## 2024-02-16 DIAGNOSIS — R519 Headache, unspecified: Secondary | ICD-10-CM | POA: Diagnosis not present

## 2024-02-16 DIAGNOSIS — M25511 Pain in right shoulder: Secondary | ICD-10-CM | POA: Diagnosis not present

## 2024-02-16 DIAGNOSIS — F17210 Nicotine dependence, cigarettes, uncomplicated: Secondary | ICD-10-CM | POA: Diagnosis not present

## 2024-02-16 DIAGNOSIS — E7800 Pure hypercholesterolemia, unspecified: Secondary | ICD-10-CM | POA: Diagnosis not present

## 2024-02-16 DIAGNOSIS — Z1159 Encounter for screening for other viral diseases: Secondary | ICD-10-CM | POA: Diagnosis not present

## 2024-02-16 DIAGNOSIS — E559 Vitamin D deficiency, unspecified: Secondary | ICD-10-CM | POA: Diagnosis not present

## 2024-02-16 DIAGNOSIS — R5383 Other fatigue: Secondary | ICD-10-CM | POA: Diagnosis not present

## 2024-02-16 DIAGNOSIS — E0590 Thyrotoxicosis, unspecified without thyrotoxic crisis or storm: Secondary | ICD-10-CM | POA: Diagnosis not present

## 2024-02-29 ENCOUNTER — Other Ambulatory Visit

## 2024-03-05 DIAGNOSIS — R600 Localized edema: Secondary | ICD-10-CM | POA: Diagnosis not present

## 2024-03-05 DIAGNOSIS — M79604 Pain in right leg: Secondary | ICD-10-CM | POA: Diagnosis not present

## 2024-03-05 DIAGNOSIS — I209 Angina pectoris, unspecified: Secondary | ICD-10-CM | POA: Diagnosis not present

## 2024-03-05 DIAGNOSIS — E119 Type 2 diabetes mellitus without complications: Secondary | ICD-10-CM | POA: Diagnosis not present

## 2024-03-05 DIAGNOSIS — I10 Essential (primary) hypertension: Secondary | ICD-10-CM | POA: Diagnosis not present

## 2024-03-05 DIAGNOSIS — E7800 Pure hypercholesterolemia, unspecified: Secondary | ICD-10-CM | POA: Diagnosis not present

## 2024-03-05 DIAGNOSIS — M19012 Primary osteoarthritis, left shoulder: Secondary | ICD-10-CM | POA: Diagnosis not present

## 2024-03-06 ENCOUNTER — Encounter: Admitting: Podiatry

## 2024-03-06 NOTE — Progress Notes (Signed)
 Patient did not show for scheduled appointment this morning

## 2024-03-08 ENCOUNTER — Ambulatory Visit: Admitting: Orthopedic Surgery

## 2024-03-08 ENCOUNTER — Other Ambulatory Visit (HOSPITAL_COMMUNITY)
Admission: RE | Admit: 2024-03-08 | Discharge: 2024-03-08 | Disposition: A | Discharge status: Home / Self Care | Payer: Self-pay | Source: Ambulatory Visit | Attending: Medical Genetics | Admitting: Medical Genetics

## 2024-03-17 LAB — GENECONNECT MOLECULAR SCREEN: Genetic Analysis Overall Interpretation: NEGATIVE

## 2024-03-18 DIAGNOSIS — M79604 Pain in right leg: Secondary | ICD-10-CM | POA: Diagnosis not present

## 2024-03-27 DIAGNOSIS — E119 Type 2 diabetes mellitus without complications: Secondary | ICD-10-CM | POA: Diagnosis not present

## 2024-04-01 DIAGNOSIS — I2510 Atherosclerotic heart disease of native coronary artery without angina pectoris: Secondary | ICD-10-CM | POA: Diagnosis not present

## 2024-04-01 DIAGNOSIS — I70201 Unspecified atherosclerosis of native arteries of extremities, right leg: Secondary | ICD-10-CM | POA: Diagnosis not present

## 2024-04-01 DIAGNOSIS — I771 Stricture of artery: Secondary | ICD-10-CM | POA: Diagnosis not present

## 2024-04-04 DIAGNOSIS — R030 Elevated blood-pressure reading, without diagnosis of hypertension: Secondary | ICD-10-CM | POA: Diagnosis not present

## 2024-04-04 DIAGNOSIS — G4700 Insomnia, unspecified: Secondary | ICD-10-CM | POA: Diagnosis not present

## 2024-04-12 ENCOUNTER — Other Ambulatory Visit: Payer: Self-pay | Admitting: *Deleted

## 2024-04-12 DIAGNOSIS — I739 Peripheral vascular disease, unspecified: Secondary | ICD-10-CM

## 2024-04-25 NOTE — Progress Notes (Deleted)
 Patient ID: Kaitlyn Chambers, female   DOB: Nov 02, 1980, 43 y.o.   MRN: [id number]  Reason for Consult: No chief complaint on file.   Referred by Reggie Nian, Thora, *  Subjective:     HPI Kaitlyn Chambers is a 43 y.o. female presenting after being noted to have a mildly diminished ABI bilaterally and right femoral artery stenosis on outside duplex imaging. ***  Past Medical History:  Diagnosis Date   Anxiety    Bipolar 1 disorder, manic, full remission    CHF (congestive heart failure) (HCC)    COPD (chronic obstructive pulmonary disease) (HCC)    Coronary artery disease    Depression    GERD (gastroesophageal reflux disease)    Gout    High cholesterol    Hypertension    Hypothyroidism    Metabolic syndrome    NSTEMI (non-ST elevated myocardial infarction) (HCC) 07/01/2016   On home oxygen  therapy    available; don't use it (07/01/2016)   PCOS (polycystic ovarian syndrome)    Stomach ulcer    Type 2 diabetes, diet controlled (HCC)    Family History  Problem Relation Age of Onset   Diabetes Mother    Heart failure Mother    Heart attack Mother 79   Heart disease Mother    Alcohol abuse Mother    Arthritis Mother    COPD Mother    Depression Mother    Drug abuse Mother    Hyperlipidemia Mother    Hypertension Mother    Intellectual disability Mother    Healthy Son    Alcohol abuse Father    Arthritis Father    Depression Father    Intellectual disability Father    Early death Father    Alcohol abuse Sister    Arthritis Sister    Depression Sister    Intellectual disability Sister    Intellectual disability Maternal Grandmother    Hypertension Maternal Grandmother    Hyperlipidemia Maternal Grandmother    Heart disease Maternal Grandmother    Diabetes Maternal Grandmother    Depression Maternal Grandmother    COPD Maternal Grandmother    Alcohol abuse Maternal Grandfather    Arthritis Maternal Grandfather    COPD Maternal Grandfather    Diabetes  Maternal Grandfather    Heart attack Maternal Grandfather    Heart disease Maternal Grandfather    Hyperlipidemia Maternal Grandfather    Hypertension Maternal Grandfather    Intellectual disability Maternal Grandfather    Arthritis Sister    Past Surgical History:  Procedure Laterality Date   CARDIAC CATHETERIZATION N/A 07/01/2016   Procedure: LEFT HEART CATH AND CORONARY ANGIOGRAPHY;  Surgeon: Lonni JONETTA Cash, MD;  Location: [facility] INVASIVE CV LAB;  Service: Cardiovascular;  Laterality: N/A;   CARDIAC CATHETERIZATION N/A 07/01/2016   Procedure: Coronary Stent Intervention;  Surgeon: Lonni JONETTA Cash, MD;  Location: [facility] INVASIVE CV [facility];  Service: Cardiovascular;  Laterality: N/A;   CARPAL TUNNEL RELEASE Bilateral    CERVICAL BIOPSY  W/ LOOP ELECTRODE EXCISION  05/2016   precancerous cells   CERVICAL BIOPSY  W/ LOOP ELECTRODE EXCISION     CORONARY ANGIOPLASTY WITH STENT PLACEMENT  07/01/2016   TONSILLECTOMY      Short Social History:  Social History   Tobacco Use   Smoking status: Every Day    Current packs/day: 0.25    Average packs/day: 0.3 packs/day for 26.0 years (6.5 ttl pk-yrs)    Types: Cigarettes   Smokeless tobacco: Never  Substance Use Topics  Alcohol use: No    Allergies  Allergen Reactions   Latex Swelling   Levaquin [Levofloxacin] Rash   Metoprolol Nausea And Vomiting and Other (See Comments)    Sweats and dizziness   Tape Rash and Other (See Comments)    Tears the skin, also    Current Outpatient Medications  Medication Sig Dispense Refill   ARIPiprazole  (ABILIFY ) 10 MG tablet Take 1 tablet (10 mg total) by mouth daily. 30 tablet 0   aspirin  EC 81 MG tablet Take 1 tablet (81 mg total) by mouth daily. Swallow whole. 30 tablet 0   atorvastatin  (LIPITOR ) 80 MG tablet Take 1 tablet (80 mg total) by mouth daily. 30 tablet 0   empagliflozin  (JARDIANCE ) 25 MG TABS tablet Take 1 tablet (25 mg total) by mouth daily. 30 tablet 0   fenofibrate  160 MG  tablet Take 1 tablet (160 mg total) by mouth daily. 30 tablet 0   furosemide  (LASIX ) 40 MG tablet Take 1 tablet (40 mg total) by mouth daily. 30 tablet 0   irbesartan  (AVAPRO ) 150 MG tablet Take 1 tablet (150 mg total) by mouth daily. 30 tablet 0   nicotine  (NICODERM CQ  - DOSED IN MG/24 HOURS) 14 mg/24hr patch Place 1 patch (14 mg total) onto the skin daily. 28 patch 0   nitrofurantoin , macrocrystal-monohydrate, (MACROBID ) 100 MG capsule Take 1 capsule (100 mg total) by mouth every 12 (twelve) hours. 4 capsule 0   prazosin  (MINIPRESS ) 1 MG capsule Take 2 capsules (2 mg total) by mouth at bedtime. 60 capsule 0   traZODone  (DESYREL ) 50 MG tablet Take 1 tablet (50 mg total) by mouth at bedtime. 30 tablet 0   No current facility-administered medications for this visit.    REVIEW OF SYSTEMS  All other systems were reviewed and are negative     Objective:  Objective   There were no vitals filed for this visit. There is no height or weight on file to calculate BMI.  Physical Exam General: no acute distress Cardiac: hemodynamically stable Pulm: normal work of breathing Abdomen: non-tender, no pulsatile mass*** Neuro: alert, no focal deficit Extremities: no edema, cyanosis or wounds*** Vascular:   Right: ***  Left: ***  Data: ABI ***  Reviewed duplex [facility] from [facility]  Reviewed BMP, creatinine 0.9  Reviewed A1c, 7.5     Assessment/Plan:   Kaitlyn Chambers is a 43 y.o. female with ***  Recommendations to optimize cardiovascular risk: Abstinence from all tobacco products. Blood glucose control with goal A1c < 7%. Blood pressure control with goal blood pressure < 140/90 mmHg. Lipid reduction therapy with goal LDL-C <100 mg/dL  Aspirin  81mg  PO QD.  Atorvastatin  40-80mg  PO QD (or other high intensity statin therapy).   Norman GORMAN Serve MD [facility]

## 2024-04-26 ENCOUNTER — Encounter (HOSPITAL_COMMUNITY): Payer: Self-pay

## 2024-04-26 ENCOUNTER — Ambulatory Visit (HOSPITAL_COMMUNITY): Admission: RE | Admit: 2024-04-26 | Source: Ambulatory Visit

## 2024-04-26 ENCOUNTER — Ambulatory Visit (HOSPITAL_COMMUNITY): Attending: Vascular Surgery | Admitting: Vascular Surgery

## 2024-05-06 ENCOUNTER — Encounter: Payer: Self-pay | Admitting: Radiology

## 2024-07-17 ENCOUNTER — Ambulatory Visit: Admitting: Podiatry

## 2024-07-29 ENCOUNTER — Ambulatory Visit: Admitting: Orthopedic Surgery

## 2024-08-14 ENCOUNTER — Ambulatory Visit: Admitting: Orthopedic Surgery
